# Patient Record
Sex: Male | Born: 1950
Health system: Southern US, Community
[De-identification: ages and names within clinical notes are randomized; demographics above are authoritative.]

## PROBLEM LIST (undated history)

## (undated) DIAGNOSIS — I10 Essential (primary) hypertension: Secondary | ICD-10-CM

## (undated) DIAGNOSIS — IMO0001 Reserved for inherently not codable concepts without codable children: Secondary | ICD-10-CM

## (undated) DIAGNOSIS — K635 Polyp of colon: Secondary | ICD-10-CM

## (undated) DIAGNOSIS — M1990 Unspecified osteoarthritis, unspecified site: Secondary | ICD-10-CM

## (undated) DIAGNOSIS — E1165 Type 2 diabetes mellitus with hyperglycemia: Secondary | ICD-10-CM

## (undated) DIAGNOSIS — K649 Unspecified hemorrhoids: Secondary | ICD-10-CM

## (undated) DIAGNOSIS — N529 Male erectile dysfunction, unspecified: Secondary | ICD-10-CM

## (undated) DIAGNOSIS — E785 Hyperlipidemia, unspecified: Secondary | ICD-10-CM

## (undated) DIAGNOSIS — L570 Actinic keratosis: Secondary | ICD-10-CM

## (undated) DIAGNOSIS — I6529 Occlusion and stenosis of unspecified carotid artery: Secondary | ICD-10-CM

## (undated) DIAGNOSIS — K810 Acute cholecystitis: Secondary | ICD-10-CM

## (undated) HISTORY — DX: Hyperlipidemia, unspecified: E78.5

## (undated) HISTORY — DX: Type 2 diabetes mellitus with hyperglycemia: E11.65

## (undated) HISTORY — DX: Unspecified osteoarthritis, unspecified site: M19.90

## (undated) HISTORY — DX: Essential (primary) hypertension: I10

## (undated) HISTORY — DX: Polyp of colon: K63.5

## (undated) HISTORY — DX: Acute cholecystitis: K81.0

## (undated) HISTORY — DX: Actinic keratosis: L57.0

## (undated) HISTORY — DX: Reserved for inherently not codable concepts without codable children: IMO0001

## (undated) HISTORY — PX: TONSILLECTOMY AND ADENOIDECTOMY: SUR1326

## (undated) HISTORY — DX: Male erectile dysfunction, unspecified: N52.9

## (undated) HISTORY — DX: Occlusion and stenosis of unspecified carotid artery: I65.29

## (undated) HISTORY — PX: COLONOSCOPY: SHX174

---

## 2003-12-07 ENCOUNTER — Encounter: Admission: RE | Admit: 2003-12-07 | Discharge: 2003-12-07 | Payer: Self-pay | Admitting: Internal Medicine

## 2003-12-24 ENCOUNTER — Emergency Department (HOSPITAL_COMMUNITY): Admission: EM | Admit: 2003-12-24 | Discharge: 2003-12-24 | Payer: Self-pay | Admitting: Emergency Medicine

## 2003-12-25 ENCOUNTER — Encounter: Admission: RE | Admit: 2003-12-25 | Discharge: 2003-12-25 | Payer: Self-pay | Admitting: Internal Medicine

## 2004-03-02 ENCOUNTER — Encounter: Admission: RE | Admit: 2004-03-02 | Discharge: 2004-03-02 | Payer: Self-pay | Admitting: Internal Medicine

## 2004-03-16 ENCOUNTER — Encounter: Admission: RE | Admit: 2004-03-16 | Discharge: 2004-03-16 | Payer: Self-pay | Admitting: Internal Medicine

## 2004-10-25 ENCOUNTER — Ambulatory Visit: Payer: Self-pay | Admitting: Cardiology

## 2004-10-25 ENCOUNTER — Inpatient Hospital Stay (HOSPITAL_COMMUNITY): Admission: EM | Admit: 2004-10-25 | Discharge: 2004-10-26 | Payer: Self-pay | Admitting: Nurse Practitioner

## 2004-11-07 ENCOUNTER — Ambulatory Visit: Payer: Self-pay

## 2004-11-07 ENCOUNTER — Ambulatory Visit: Payer: Self-pay | Admitting: Internal Medicine

## 2004-11-12 ENCOUNTER — Ambulatory Visit: Payer: Self-pay

## 2005-10-23 ENCOUNTER — Encounter: Admission: RE | Admit: 2005-10-23 | Discharge: 2005-10-23 | Payer: Self-pay | Admitting: Internal Medicine

## 2007-04-30 HISTORY — PX: CARDIAC CATHETERIZATION: SHX172

## 2011-01-08 ENCOUNTER — Ambulatory Visit (INDEPENDENT_AMBULATORY_CARE_PROVIDER_SITE_OTHER): Payer: BC Managed Care – PPO | Admitting: Family Medicine

## 2011-01-08 ENCOUNTER — Encounter: Payer: Self-pay | Admitting: Family Medicine

## 2011-01-08 VITALS — BP 122/76 | HR 64 | Temp 98.4°F | Ht 70.0 in | Wt 173.0 lb

## 2011-01-08 DIAGNOSIS — K635 Polyp of colon: Secondary | ICD-10-CM

## 2011-01-08 DIAGNOSIS — H547 Unspecified visual loss: Secondary | ICD-10-CM

## 2011-01-08 DIAGNOSIS — I10 Essential (primary) hypertension: Secondary | ICD-10-CM

## 2011-01-08 DIAGNOSIS — K5900 Constipation, unspecified: Secondary | ICD-10-CM

## 2011-01-08 DIAGNOSIS — D126 Benign neoplasm of colon, unspecified: Secondary | ICD-10-CM

## 2011-01-08 DIAGNOSIS — E785 Hyperlipidemia, unspecified: Secondary | ICD-10-CM

## 2011-01-08 DIAGNOSIS — Z0000 Encounter for general adult medical examination without abnormal findings: Secondary | ICD-10-CM

## 2011-01-08 DIAGNOSIS — Z125 Encounter for screening for malignant neoplasm of prostate: Secondary | ICD-10-CM

## 2011-01-08 DIAGNOSIS — E1169 Type 2 diabetes mellitus with other specified complication: Secondary | ICD-10-CM | POA: Insufficient documentation

## 2011-01-08 LAB — GLUCOSE, POCT (MANUAL RESULT ENTRY): POC Glucose: 77

## 2011-01-08 NOTE — Assessment & Plan Note (Signed)
Recommended call GI to set up rpt colonoscopy as past due.

## 2011-01-08 NOTE — Assessment & Plan Note (Signed)
Continue meds. Await records.  When returns fasting check FLP.

## 2011-01-08 NOTE — Assessment & Plan Note (Signed)
Snellen decreased on left today. Recommended vision screen. Anticipate some presbyopia

## 2011-01-08 NOTE — Assessment & Plan Note (Signed)
Good control on antihypertensive. Continue. Await records.

## 2011-01-08 NOTE — Progress Notes (Signed)
Subjective:    Patient ID: Mark Davidson, male    DOB: 1951-01-12, 60 y.o.   MRN: [id number]  HPI CC: new pt, establish  Previously saw Dr. Jarold Motto at [facility].  None recently.  Concerned about blood sugar.  Had DOT physical recently, This was done at [facility].  03/2010.  Told sugar was a bit elevated.  Told didn't need any meds.  Has been noticing polyuria, nocturia x~3, does drink large amt soft drinks (regular), 4-5 cans pepsi/day.  Had sausage biscuit this am.    Does go to [place] in [place] to check blood pressure.  Started on ACEI/HCTZ 04/2010.  Does check at pharmacy regularly, doing well.  No HA, vision changes, CP/tightness, SOB, leg swelling.   Also with allergy sxs and cough, feels mildly congested.  No RN, sneezing, itchy eyes.  Some constipation recently in last 2-3 months, not regular, only every few days.  Has been using OTC stool softeners which didn't help.  Then used some laxative or colon cleanser from pharmacy.  Last 3-4 times has had normal stools.  Preventative: Colonoscopy - done ~2008.  Told 2 polyps, recommend rpt every 2 years.  Had done in [place], unsure where.  Due for repeat. Has prostate checked at DOT physical.   Last tetanus shot 03/2010 Hep B shot series completed at [facility].  Medications and allergies reviewed and updated in chart.  Past histories reviewed and updated if relevant as below. There is no problem list on file for this patient.  Past Medical History  Diagnosis Date  . Arthritis   . Hypertension   . Hyperlipemia   . Colon polyp    Past Surgical History  Procedure Date  . Tonsillectomy and adenoidectomy   . Cardiac catheterization 2009    normal per pt   History  Substance Use Topics  . Smoking status: Never Smoker   . Smokeless tobacco: Current User    Types: Snuff  . Alcohol Use: No   Family History  Problem Relation Age of Onset  . Rheum arthritis Father   . Hyperlipidemia Father   . Heart disease Father   .  Stroke Father   . Diabetes Father   . Arthritis Father   . Coronary artery disease Father   . Breast cancer Mother   . Cancer Mother     breast  . Heart disease Brother   . Coronary artery disease Brother   . Hyperlipidemia Brother   . Diabetes Brother    No Known Allergies No current outpatient prescriptions on file prior to visit.   Review of Systems  Constitutional: Negative for fever, chills, activity change, appetite change, fatigue and unexpected weight change.  HENT: Negative for hearing loss and neck pain.   Eyes: Negative for visual disturbance.  Respiratory: Negative for cough, chest tightness, shortness of breath and wheezing.   Cardiovascular: Negative for chest pain, palpitations and leg swelling.  Gastrointestinal: Negative for nausea, vomiting, abdominal pain, diarrhea, constipation, blood in stool and abdominal distention.  Genitourinary: Negative for hematuria and difficulty urinating.  Musculoskeletal: Negative for myalgias and arthralgias.  Skin: Negative for rash.  Neurological: Negative for dizziness, seizures, syncope and headaches.  Hematological: Does not bruise/bleed easily.  Psychiatric/Behavioral: Negative for dysphoric mood. The patient is not nervous/anxious.        Objective:   Physical Exam  Nursing note and vitals reviewed. Constitutional: He is oriented to person, place, and time. He appears well-developed and well-nourished. No distress.  HENT:  Head: Normocephalic and atraumatic.  Right Ear: Hearing, tympanic membrane, external ear and ear canal normal.  Left Ear: Hearing, tympanic membrane, external ear and ear canal normal.  Nose: Nose normal. No mucosal edema or rhinorrhea.  Mouth/Throat: Uvula is midline, oropharynx is clear and moist and mucous membranes are normal. No oropharyngeal exudate, posterior oropharyngeal edema, posterior oropharyngeal erythema or tonsillar abscesses.  Eyes: Conjunctivae and EOM are normal. Pupils are equal,  round, and reactive to light. No scleral icterus.  Neck: Normal range of motion. Neck supple. No thyromegaly present.  Cardiovascular: Normal rate, regular rhythm, normal heart sounds and intact distal pulses.   No murmur heard. Pulses:      Radial pulses are 2+ on the right side, and 2+ on the left side.  Pulmonary/Chest: Effort normal and breath sounds normal. No respiratory distress. He has no wheezes. He has no rales.  Abdominal: Soft. Bowel sounds are normal. He exhibits no distension and no mass. There is no tenderness. There is no rebound and no guarding.  Musculoskeletal: Normal range of motion. He exhibits no edema.  Lymphadenopathy:    He has no cervical adenopathy.  Neurological: He is alert and oriented to person, place, and time.       CN grossly intact, station and gait intact  Skin: Skin is warm and dry. No rash noted.  Psychiatric: He has a normal mood and affect. His behavior is normal. Judgment and thought content normal.      Assessment & Plan:

## 2011-01-08 NOTE — Patient Instructions (Addendum)
Goal is one soft stool a day.  Start with more fiber in diet (handout provided). May use benefiber or metamucil (but make sure to increase water intake at same time).  If more problems with constipation, give me a call.  We will request records from Dr. Jarold Motto at [facility].  I'd recommend checking for colonoscopy at home and calling to reschedule as you're due. We checked sugar today, looking ok. Come back in 3-4 months for DOT physical, return prior fasting for blood work. Good to meet you today, call us with questions.

## 2011-01-08 NOTE — Assessment & Plan Note (Signed)
Discussed importance of fiber and water.  rec colace, fiber in diet, fiber supplements.  If not improved to let me know, would likely recommend miralax.

## 2011-04-02 ENCOUNTER — Other Ambulatory Visit: Payer: Self-pay | Admitting: Internal Medicine

## 2011-04-02 ENCOUNTER — Other Ambulatory Visit: Payer: Self-pay | Admitting: Family Medicine

## 2011-04-02 MED ORDER — LISINOPRIL-HYDROCHLOROTHIAZIDE 10-12.5 MG PO TABS: 1.0000 | ORAL_TABLET | Freq: Every day | ORAL | Status: DC

## 2011-04-02 MED ORDER — OMEPRAZOLE MAGNESIUM 20 MG PO TBEC: 20.0000 mg | DELAYED_RELEASE_TABLET | Freq: Two times a day (BID) | ORAL | Status: DC

## 2011-04-02 MED ORDER — PRAVASTATIN SODIUM 80 MG PO TABS: 80.0000 mg | ORAL_TABLET | Freq: Every day | ORAL | Status: DC

## 2011-04-02 NOTE — Telephone Encounter (Signed)
Patient's wife called and stated that you stated when he ran out of his medications at the [place] to call and you would reorder all of his medications to [facility] pharmacy.  Please advise.

## 2011-04-02 NOTE — Telephone Encounter (Signed)
Sent in 3 mo supply.

## 2011-04-03 ENCOUNTER — Other Ambulatory Visit: Payer: BC Managed Care – PPO

## 2011-04-03 NOTE — Telephone Encounter (Signed)
Message left notifying patient. Instructed him to call with questions or problems.

## 2011-04-09 ENCOUNTER — Encounter: Payer: BC Managed Care – PPO | Admitting: Family Medicine

## 2011-06-28 ENCOUNTER — Ambulatory Visit (INDEPENDENT_AMBULATORY_CARE_PROVIDER_SITE_OTHER): Payer: BC Managed Care – PPO | Admitting: Family Medicine

## 2011-06-28 ENCOUNTER — Encounter: Payer: Self-pay | Admitting: Family Medicine

## 2011-06-28 VITALS — BP 130/80 | HR 68 | Temp 98.1°F | Wt 185.2 lb

## 2011-06-28 DIAGNOSIS — N529 Male erectile dysfunction, unspecified: Secondary | ICD-10-CM

## 2011-06-28 DIAGNOSIS — I10 Essential (primary) hypertension: Secondary | ICD-10-CM

## 2011-06-28 DIAGNOSIS — R21 Rash and other nonspecific skin eruption: Secondary | ICD-10-CM

## 2011-06-28 DIAGNOSIS — L853 Xerosis cutis: Secondary | ICD-10-CM | POA: Insufficient documentation

## 2011-06-28 MED ORDER — SILDENAFIL CITRATE 100 MG PO TABS: 100.0000 mg | ORAL_TABLET | Freq: Every day | ORAL | Status: DC | PRN

## 2011-06-28 NOTE — Patient Instructions (Signed)
For itch - skin looks fine.  Use sarna cream and oatmeal baths.  If not better with this then let me know. Blood pressure is looking ok today. Continue medicine. I will send in viagra to pharmacy - use coupon provided.  Take 1/2 pill of 100mg  as needed.  Return at your convenience fasting for blood work and afterwards for physical - we will go over blood work results then.

## 2011-06-28 NOTE — Progress Notes (Signed)
  Subjective:    Patient ID: Mark Davidson, male    DOB: [birth year], 61 y.o.   MRN: [id number]  HPI CC: skin rash and nocturia  1 mo h/o skin irritation on legs and on chest - itching and burning.  Worse at night when trying to sleep.  No new lotions, detergents, soaps, shampoos.  Uses ivory soap.  No new meds.  Hit left anterior shin 2-3 months ago, staying red.    Nocturia 3-4 x/night.  Strong stream.  No dysuria.  Urge present.  Polyuria present.  Drinks 4-5 pepsi's day.  Last drink is water at bedtime with pravastatin.  Never told enlarged prostate, told normal.  Has been told has borderline sugars.  Last visit random cbg normal at 77.  ED - Has tried viagra and cialis but too expensive.  Wonders about alternatives.  Had DOT physical last month, didn't have prostate checked. Last colonoscopy 2008, told rpt 2 yrs as found polyps, did not return.  Due for this.  Review of Systems Per HPI    Objective:   Physical Exam  Nursing note and vitals reviewed. Constitutional: He appears well-developed and well-nourished. No distress.  HENT:  Head: Normocephalic and atraumatic.  Mouth/Throat: Oropharynx is clear and moist. No oropharyngeal exudate.  Eyes: Conjunctivae and EOM are normal. Pupils are equal, round, and reactive to light. No scleral icterus.  Neck: Normal range of motion. Neck supple. Carotid bruit is not present.  Cardiovascular: Normal rate, regular rhythm, normal heart sounds and intact distal pulses.   No murmur heard. Pulmonary/Chest: Effort normal and breath sounds normal. No respiratory distress. He has no wheezes. He has no rales.  Musculoskeletal: He exhibits no edema.  Lymphadenopathy:    He has no cervical adenopathy.  Skin: Skin is warm and dry. No rash noted.       No rash on legs or on back or abdomen, chest. Slight hypervascular macule left anterior lower leg.  Psychiatric: He has a normal mood and affect.       Assessment & Plan:

## 2011-06-28 NOTE — Assessment & Plan Note (Signed)
Chronic.  Has used viagra and cialis in past, work well but concerned about cost. Provided with viagra script and coupons. Discussed use.

## 2011-06-28 NOTE — Assessment & Plan Note (Signed)
Chronic. Stable, continue

## 2011-06-28 NOTE — Assessment & Plan Note (Signed)
Anticipate mild xerosis.  No rash noted. rec sarna and oatmeal baths. Discussed avoid hot baths.

## 2011-08-08 ENCOUNTER — Other Ambulatory Visit (INDEPENDENT_AMBULATORY_CARE_PROVIDER_SITE_OTHER): Payer: BC Managed Care – PPO

## 2011-08-08 DIAGNOSIS — Z125 Encounter for screening for malignant neoplasm of prostate: Secondary | ICD-10-CM

## 2011-08-08 DIAGNOSIS — Z0000 Encounter for general adult medical examination without abnormal findings: Secondary | ICD-10-CM

## 2011-08-08 LAB — COMPREHENSIVE METABOLIC PANEL
ALT: 22 U/L (ref 0–53)
AST: 18 U/L (ref 0–37)
Albumin: 4.5 g/dL (ref 3.5–5.2)
Alkaline Phosphatase: 82 U/L (ref 39–117)
BUN: 18 mg/dL (ref 6–23)
CO2: 27 mEq/L (ref 19–32)
Calcium: 9.6 mg/dL (ref 8.4–10.5)
Chloride: 101 mEq/L (ref 96–112)
Creatinine, Ser: 1.1 mg/dL (ref 0.4–1.5)
GFR: 76.11 mL/min (ref >60.00)
Glucose, Bld: 97 mg/dL (ref 70–99)
Potassium: 4.1 mEq/L (ref 3.5–5.1)
Sodium: 137 mEq/L (ref 135–145)
Total Bilirubin: 0.8 mg/dL (ref 0.3–1.2)
Total Protein: 7.3 g/dL (ref 6.0–8.3)

## 2011-08-08 LAB — LIPID PANEL
Cholesterol: 211 mg/dL — ABNORMAL HIGH (ref 0–200)
HDL: 41.9 mg/dL (ref >39.00)
Total CHOL/HDL Ratio: 5
Triglycerides: 144 mg/dL (ref 0.0–149.0)
VLDL: 28.8 mg/dL (ref 0.0–40.0)

## 2011-08-08 LAB — LDL CHOLESTEROL, DIRECT: Direct LDL: 147.8 mg/dL

## 2011-08-08 LAB — PSA: PSA: 0.55 ng/mL (ref 0.10–4.00)

## 2011-08-08 LAB — TSH: TSH: 0.98 u[IU]/mL (ref 0.35–5.50)

## 2011-08-15 ENCOUNTER — Encounter: Payer: BC Managed Care – PPO | Admitting: Family Medicine

## 2011-08-19 ENCOUNTER — Telehealth: Payer: Self-pay

## 2011-08-19 NOTE — Telephone Encounter (Signed)
Pt seen 06/28/11; Sarna cream gives temporary relief from redness and itching but relief does not last. Pt cancelled CPX and would like lab results.Pt uses [facility] and can be reached at [phone].

## 2011-08-20 NOTE — Telephone Encounter (Signed)
Pt called back and would like Dr Gutierrez's response called back to [phone].

## 2011-08-20 NOTE — Telephone Encounter (Signed)
Using calogel lotion which relieves itching but staying red. Exposed to powders at work wonders if allergic rxn. Advised stop peroxide and alcohol as has been using this. rec come in for OV to eval. Pt will call to make appt next week.

## 2011-08-27 ENCOUNTER — Ambulatory Visit (INDEPENDENT_AMBULATORY_CARE_PROVIDER_SITE_OTHER): Payer: BC Managed Care – PPO | Admitting: Family Medicine

## 2011-08-27 ENCOUNTER — Encounter: Payer: Self-pay | Admitting: Family Medicine

## 2011-08-27 VITALS — BP 140/82 | HR 64 | Temp 97.2°F | Wt 182.5 lb

## 2011-08-27 DIAGNOSIS — R21 Rash and other nonspecific skin eruption: Secondary | ICD-10-CM

## 2011-08-27 DIAGNOSIS — R05 Cough: Secondary | ICD-10-CM | POA: Insufficient documentation

## 2011-08-27 DIAGNOSIS — H6120 Impacted cerumen, unspecified ear: Secondary | ICD-10-CM

## 2011-08-27 DIAGNOSIS — R059 Cough, unspecified: Secondary | ICD-10-CM | POA: Insufficient documentation

## 2011-08-27 DIAGNOSIS — R058 Other specified cough: Secondary | ICD-10-CM

## 2011-08-27 MED ORDER — TRIAMCINOLONE ACETONIDE 0.1 % EX CREA: TOPICAL_CREAM | Freq: Two times a day (BID) | CUTANEOUS | Status: DC

## 2011-08-27 MED ORDER — LOSARTAN POTASSIUM-HCTZ 50-12.5 MG PO TABS: 1.0000 | ORAL_TABLET | Freq: Every day | ORAL | Status: DC

## 2011-08-27 NOTE — Assessment & Plan Note (Signed)
Anticipate continued xerosis/dry skin dermatitis. Treat with triamcinolone cream bid, update if not improving.

## 2011-08-27 NOTE — Patient Instructions (Signed)
Try steroid cream to rash on legs - should help with itching.  For blood pressure stop lisinopril HCTZ combo and start hyzaar combo.  Sent to pharmacy - price this out.  Keep eye on cough.

## 2011-08-27 NOTE — Progress Notes (Signed)
  Subjective:    Patient ID: Mark Davidson, male    DOB: 12-06-1950, 61 y.o.   MRN: [id number]  HPI CC: skin rash  Seen here 06/28/2011 with 1 mo h/o skin irritation on legs and on chest - itching and burning. Worse at night when trying to sleep. No new lotions, detergents, soaps, shampoos. Uses ivory soap. No new meds. Hit left anterior shin 2-3 months ago, staying red.   When examined, no rash noted, thought mild xerosis, treated with oatmeal bath and sarna cream.  States this improved sxs but only temporarily and then rash would present.  Also discussed avoidance of hot showers.  Since then, using sarna, oatmeal baths, calogel lotion which relieves itching but then returns and rash remaining. Exposed to different powders and chemicals at work (works at [employer]) - wonders if allergic rxn to something exposed to at work. Had been using peroxide and alcohol for itch - but stopped this 1 wk ago. Worse when getting out of shower - red and itchy rash. Seems localized to calves (L>R) and anterior chest.  Some on arms.  Rash present for last several months.  No fevers/chills, abd pain, nausea, oral lesions, joint pains.  Dry cough present intermittently for last several months.  Dips, doesn't smoke.  On ACEI for last year.  Also wants ears checked - some tinnitus on left side.  Review of Systems Per HPI    Objective:   Physical Exam  Nursing note and vitals reviewed. Constitutional: He appears well-developed and well-nourished. No distress.  HENT:  Right Ear: External ear normal.  Left Ear: External ear normal.       Cerumen impaction bilaterally - disimpaction performed with plastic curettes with improvement in tinnitus.but still unable to visualize TMs - remaining cerumen.  Cardiovascular: Normal rate, regular rhythm, normal heart sounds and intact distal pulses.   No murmur heard. Pulmonary/Chest: Effort normal and breath sounds normal. No respiratory distress. He has no  wheezes. He has no rales.  Skin: Rash noted.       Left medial/posterior calf with large patch of rough, dry pruritic skin. Smaller patch R medial calf. Anterior forearms with mild erythema. Slight erythema on chest        Assessment & Plan:

## 2011-08-27 NOTE — Assessment & Plan Note (Signed)
Disimpaction performed, advised to start debrox or dilute H2O2 every 2 wks rtc as needed, consider irrigation next visit.Marland Kitchen

## 2011-08-27 NOTE — Assessment & Plan Note (Signed)
?  ACEI induced. Change to ARB, reassess next visit. rtc 2 mo to f/u BP.

## 2012-01-17 ENCOUNTER — Ambulatory Visit: Payer: BC Managed Care – PPO

## 2012-03-23 ENCOUNTER — Ambulatory Visit (INDEPENDENT_AMBULATORY_CARE_PROVIDER_SITE_OTHER): Payer: BC Managed Care – PPO | Admitting: Family Medicine

## 2012-03-23 ENCOUNTER — Encounter: Payer: Self-pay | Admitting: Family Medicine

## 2012-03-23 VITALS — BP 122/62 | HR 68 | Temp 98.0°F | Wt 182.8 lb

## 2012-03-23 DIAGNOSIS — R21 Rash and other nonspecific skin eruption: Secondary | ICD-10-CM

## 2012-03-23 DIAGNOSIS — I6523 Occlusion and stenosis of bilateral carotid arteries: Secondary | ICD-10-CM | POA: Insufficient documentation

## 2012-03-23 DIAGNOSIS — R0989 Other specified symptoms and signs involving the circulatory and respiratory systems: Secondary | ICD-10-CM

## 2012-03-23 DIAGNOSIS — I10 Essential (primary) hypertension: Secondary | ICD-10-CM

## 2012-03-23 DIAGNOSIS — Z23 Encounter for immunization: Secondary | ICD-10-CM

## 2012-03-23 MED ORDER — TRIAMCINOLONE ACETONIDE 0.1 % EX CREA: TOPICAL_CREAM | Freq: Two times a day (BID) | CUTANEOUS | Status: AC

## 2012-03-23 NOTE — Progress Notes (Signed)
  Subjective:    Patient ID: Mark Davidson, male    DOB: Sep 30, 1950, 61 y.o.   MRN: [id number]  HPI CC: skin itching returning and f/u blood pressure  HTN - No HA, CP/tightness, SOB, leg swelling.  Some blurry vision noted when indoors. Sees outside fine.  Last saw eye doctor 1-2 yrs ago.  Compliant with meds.    Has been told "borderline" sugar.  Skin itch - last time responded well to TCI cream.  Requests refill.  H/o xerosis.  Using sensitive soap.  Past Medical History  Diagnosis Date  . Arthritis   . Hypertension   . Hyperlipemia   . Colon polyp   . ED (erectile dysfunction)      Review of Systems Per HPI    Objective:   Physical Exam  Nursing note and vitals reviewed. Constitutional: He appears well-developed and well-nourished. No distress.  HENT:  Head: Normocephalic and atraumatic.  Mouth/Throat: Oropharynx is clear and moist. No oropharyngeal exudate.  Eyes: Conjunctivae normal and EOM are normal. Pupils are equal, round, and reactive to light. No scleral icterus.  Neck: Normal range of motion. Neck supple. Carotid bruit is present (L mild).  Cardiovascular: Normal rate, regular rhythm, normal heart sounds and intact distal pulses.   No murmur heard. Pulmonary/Chest: Effort normal and breath sounds normal. No respiratory distress. He has no wheezes. He has no rales.  Musculoskeletal: He exhibits no edema.  Lymphadenopathy:    He has no cervical adenopathy.  Skin: Skin is warm and dry. No rash noted.       No rash on legs appreciated       Assessment & Plan:

## 2012-03-23 NOTE — Assessment & Plan Note (Signed)
Discussed management of xerosis.   rec avoid hot showers as well as regular moisturizing. Prescribe triamcinolone cream to use as needed, discussed steroid holiday.

## 2012-03-23 NOTE — Assessment & Plan Note (Signed)
Chronic, stable. Continue med. 

## 2012-03-23 NOTE — Patient Instructions (Addendum)
Consider decreasing prilosec to 20mg  one daily. I've refilled triamcinolone. Good to see you today, call us with questions. You're due for colonoscopy!  Please schedule appointment given your history of polyps. Flu shot today. For legs - use moisturizer (Aveeno or cetaphil or vaseline brand) after shower daily.  May use triamcinolone cream as needed, but no more than 2 wks at a time. Pass by Marion's office for referral for carotid ultrasound.

## 2012-03-23 NOTE — Assessment & Plan Note (Signed)
New.  Will obtain carotid US to further eval. Mother with h/o same.

## 2012-03-29 HISTORY — PX: OTHER SURGICAL HISTORY: SHX169

## 2012-04-02 ENCOUNTER — Encounter (INDEPENDENT_AMBULATORY_CARE_PROVIDER_SITE_OTHER): Payer: BC Managed Care – PPO

## 2012-04-02 DIAGNOSIS — I6529 Occlusion and stenosis of unspecified carotid artery: Secondary | ICD-10-CM

## 2012-04-02 DIAGNOSIS — R0989 Other specified symptoms and signs involving the circulatory and respiratory systems: Secondary | ICD-10-CM

## 2012-04-07 ENCOUNTER — Encounter: Payer: Self-pay | Admitting: *Deleted

## 2012-04-07 ENCOUNTER — Encounter: Payer: Self-pay | Admitting: Family Medicine

## 2012-04-20 ENCOUNTER — Other Ambulatory Visit: Payer: Self-pay | Admitting: *Deleted

## 2012-04-20 MED ORDER — PRAVASTATIN SODIUM 80 MG PO TABS: 80.0000 mg | ORAL_TABLET | Freq: Every day | ORAL | Status: DC

## 2012-05-12 ENCOUNTER — Other Ambulatory Visit: Payer: Self-pay | Admitting: *Deleted

## 2012-05-12 MED ORDER — OMEPRAZOLE MAGNESIUM 20 MG PO TBEC: 20.0000 mg | DELAYED_RELEASE_TABLET | Freq: Two times a day (BID) | ORAL | Status: DC

## 2012-09-18 ENCOUNTER — Other Ambulatory Visit: Payer: Self-pay | Admitting: *Deleted

## 2012-09-18 MED ORDER — LOSARTAN POTASSIUM-HCTZ 50-12.5 MG PO TABS: 1.0000 | ORAL_TABLET | Freq: Every day | ORAL | Status: DC

## 2012-10-19 ENCOUNTER — Other Ambulatory Visit: Payer: Self-pay | Admitting: *Deleted

## 2012-10-19 MED ORDER — PRAVASTATIN SODIUM 80 MG PO TABS: 80.0000 mg | ORAL_TABLET | Freq: Every day | ORAL | Status: DC

## 2012-10-19 NOTE — Telephone Encounter (Signed)
Fax refill request, no recent/future appt, forward to PCP

## 2013-04-09 ENCOUNTER — Ambulatory Visit: Payer: BC Managed Care – PPO

## 2013-04-15 ENCOUNTER — Telehealth: Payer: Self-pay | Admitting: *Deleted

## 2013-04-15 NOTE — Telephone Encounter (Signed)
Patient doen't want to schedule Carotid at this time due to patient having an account balance. I gave the patient the billing number to [facility] and said that we could work with him. He said that he would reschedule at a later date.

## 2013-04-16 ENCOUNTER — Other Ambulatory Visit: Payer: Self-pay | Admitting: Family Medicine

## 2013-04-19 ENCOUNTER — Ambulatory Visit (INDEPENDENT_AMBULATORY_CARE_PROVIDER_SITE_OTHER): Payer: BC Managed Care – PPO

## 2013-04-19 DIAGNOSIS — Z23 Encounter for immunization: Secondary | ICD-10-CM

## 2013-04-20 ENCOUNTER — Ambulatory Visit: Payer: BC Managed Care – PPO

## 2013-04-20 ENCOUNTER — Other Ambulatory Visit: Payer: Self-pay | Admitting: Family Medicine

## 2013-04-20 NOTE — Telephone Encounter (Signed)
Pt request medication refill. Last OV 02/2012 with no future appts scheduled. pls advise.

## 2013-04-21 ENCOUNTER — Other Ambulatory Visit: Payer: Self-pay | Admitting: Family Medicine

## 2013-04-21 NOTE — Telephone Encounter (Signed)
guttierez pt

## 2013-04-21 NOTE — Addendum Note (Signed)
Addended by: Josph Macho A on: 04/21/2013 08:44 AM   Modules accepted: Orders

## 2013-05-18 ENCOUNTER — Ambulatory Visit: Payer: BC Managed Care – PPO | Admitting: Family Medicine

## 2013-05-19 ENCOUNTER — Other Ambulatory Visit: Payer: Self-pay | Admitting: Family Medicine

## 2013-05-19 ENCOUNTER — Encounter: Payer: Self-pay | Admitting: Family Medicine

## 2013-05-19 ENCOUNTER — Ambulatory Visit (INDEPENDENT_AMBULATORY_CARE_PROVIDER_SITE_OTHER): Payer: BC Managed Care – PPO | Admitting: Family Medicine

## 2013-05-19 VITALS — BP 148/84 | HR 62 | Temp 98.1°F | Wt 184.5 lb

## 2013-05-19 DIAGNOSIS — H6120 Impacted cerumen, unspecified ear: Secondary | ICD-10-CM

## 2013-05-19 DIAGNOSIS — J019 Acute sinusitis, unspecified: Secondary | ICD-10-CM

## 2013-05-19 MED ORDER — LOSARTAN POTASSIUM-HCTZ 50-12.5 MG PO TABS: 1.0000 | ORAL_TABLET | Freq: Every day | ORAL | Status: DC

## 2013-05-19 MED ORDER — AMOXICILLIN-POT CLAVULANATE 875-125 MG PO TABS: 1.0000 | ORAL_TABLET | Freq: Two times a day (BID) | ORAL | Status: AC

## 2013-05-19 NOTE — Assessment & Plan Note (Signed)
Anticipate acute sinusitis, but no indication of acute bacterial infection. Supportive care as per instructions. May fill WASP for augmentin if sxs persist past 10 days or any worsening  Pt agrees  Will return for CPE

## 2013-05-19 NOTE — Assessment & Plan Note (Signed)
Irrigation performed today. 

## 2013-05-19 NOTE — Progress Notes (Signed)
Pre-visit discussion using our clinic review tool. No additional management support is needed unless otherwise documented below in the visit note.  

## 2013-05-19 NOTE — Patient Instructions (Addendum)
Ears irrigated today. You have a sinus infection. Push fluids and plenty of rest. Use tylenol for discomfort in sinuses. Nasal saline irrigation or neti pot to help drain sinuses. May use plain mucinex or immediate release guaifenesin with plenty of fluid to help mobilize mucous. If persistent symptoms into the weekend, fill antibiotic provided today. Let us know if fever >101.5, trouble opening/closing mouth, difficulty swallowing, or worsening - you may need to be seen again.

## 2013-05-19 NOTE — Progress Notes (Signed)
   Subjective:    Patient ID: Mark Davidson, male    DOB: Nov 19, 1950, 63 y.o.   MRN: [id number]  HPI CC: ear and head congestion  6d h/o head congestion and ears stopped up.  Nasal congestion with sinus congestion. +PNdrainage. No fevers, abd pain, cough, ST.  No body aches. So far has tried OTC pseudophed which hasn't helped.  + contacts with flu at work. No smoking at home No h/o asthma.  Past Medical History  Diagnosis Date  . Arthritis   . Hypertension   . Hyperlipemia   . Colon polyp   . ED (erectile dysfunction)   . Left carotid bruit     Review of Systems Per HPI    Objective:   Physical Exam  Nursing note and vitals reviewed. Constitutional: He appears well-developed and well-nourished. No distress.  HENT:  Head: Normocephalic and atraumatic.  Right Ear: Hearing, external ear and ear canal normal.  Left Ear: Hearing, external ear and ear canal normal.  Nose: Mucosal edema present. No rhinorrhea. Right sinus exhibits no maxillary sinus tenderness and no frontal sinus tenderness. Left sinus exhibits no maxillary sinus tenderness and no frontal sinus tenderness.  Mouth/Throat: Uvula is midline and mucous membranes are normal. Posterior oropharyngeal erythema present. No oropharyngeal exudate, posterior oropharyngeal edema or tonsillar abscesses.  Cerumen covering TM bilaterally Nasal mucosal congestion  Eyes: Conjunctivae and EOM are normal. Pupils are equal, round, and reactive to light. No scleral icterus.  Neck: Normal range of motion. Neck supple.  Cardiovascular: Normal rate, regular rhythm, normal heart sounds and intact distal pulses.   No murmur heard. Pulmonary/Chest: Effort normal and breath sounds normal. No respiratory distress. He has no wheezes. He has no rales.  Lymphadenopathy:    He has no cervical adenopathy.  Skin: Skin is warm and dry. No rash noted.       Assessment & Plan:

## 2013-06-01 ENCOUNTER — Telehealth: Payer: Self-pay | Admitting: Family Medicine

## 2013-06-01 NOTE — Telephone Encounter (Signed)
Relevant patient education mailed to patient.  

## 2013-06-28 ENCOUNTER — Other Ambulatory Visit: Payer: Self-pay | Admitting: Family Medicine

## 2013-06-28 DIAGNOSIS — I10 Essential (primary) hypertension: Secondary | ICD-10-CM

## 2013-06-29 ENCOUNTER — Other Ambulatory Visit: Payer: BC Managed Care – PPO

## 2013-07-05 ENCOUNTER — Encounter: Payer: BC Managed Care – PPO | Admitting: Family Medicine

## 2013-07-12 ENCOUNTER — Other Ambulatory Visit: Payer: Self-pay | Admitting: Family Medicine

## 2013-08-12 ENCOUNTER — Other Ambulatory Visit: Payer: Self-pay | Admitting: Family Medicine

## 2013-08-13 ENCOUNTER — Encounter: Payer: Self-pay | Admitting: Family Medicine

## 2013-08-13 ENCOUNTER — Ambulatory Visit (INDEPENDENT_AMBULATORY_CARE_PROVIDER_SITE_OTHER): Payer: BC Managed Care – PPO | Admitting: Family Medicine

## 2013-08-13 VITALS — BP 130/62 | HR 66 | Temp 98.0°F | Wt 189.2 lb

## 2013-08-13 DIAGNOSIS — I10 Essential (primary) hypertension: Secondary | ICD-10-CM

## 2013-08-13 DIAGNOSIS — E785 Hyperlipidemia, unspecified: Secondary | ICD-10-CM

## 2013-08-13 MED ORDER — LOSARTAN POTASSIUM-HCTZ 50-12.5 MG PO TABS: 1.0000 | ORAL_TABLET | Freq: Every day | ORAL | Status: DC

## 2013-08-13 MED ORDER — PRAVASTATIN SODIUM 80 MG PO TABS: ORAL_TABLET | ORAL | Status: DC

## 2013-08-13 NOTE — Patient Instructions (Signed)
Schedule a fasting lab visit.  Don't change your meds.  Gradually cut back on pepsi drinks.   Take care.  Glad to see you.

## 2013-08-13 NOTE — Progress Notes (Signed)
Pre visit review using our clinic review tool, if applicable. No additional management support is needed unless otherwise documented below in the visit note.  Hypertension:    Using medication without problems or lightheadedness: yes Chest pain with exertion:no Edema:no Short of breath:no  Elevated Cholesterol: Using medications without problems:yes Muscle aches: no Diet compliance:discussed cutting out pepsi- drinking about 5 a day! Exercise:yes, walking and weights.    He'll come back for fasting lab.  D/w pt.    Meds, vitals, and allergies reviewed.   PMH and SH reviewed  ROS: See HPI.  Otherwise negative.    GEN: nad, alert and oriented HEENT: mucous membranes moist NECK: supple w/o LA CV: rrr. PULM: ctab, no inc wob ABD: soft, +bs EXT: no edema SKIN: no acute rash

## 2013-08-14 ENCOUNTER — Telehealth: Payer: Self-pay | Admitting: Family Medicine

## 2013-08-14 NOTE — Telephone Encounter (Signed)
Relevant patient education mailed to patient.  

## 2013-08-15 NOTE — Assessment & Plan Note (Signed)
No change in meds.  Will need to work on diet, taper sodas.  He'll come back for fasting lab.  He agrees. Routed to PCP as FYI.  

## 2013-08-15 NOTE — Assessment & Plan Note (Signed)
No change in meds.  Will need to work on diet, taper sodas.  He'll come back for fasting lab.  He agrees. Routed to PCP as FYI.

## 2013-08-18 ENCOUNTER — Other Ambulatory Visit (INDEPENDENT_AMBULATORY_CARE_PROVIDER_SITE_OTHER): Payer: BC Managed Care – PPO

## 2013-08-18 DIAGNOSIS — I10 Essential (primary) hypertension: Secondary | ICD-10-CM

## 2013-08-18 DIAGNOSIS — E785 Hyperlipidemia, unspecified: Secondary | ICD-10-CM

## 2013-08-18 LAB — COMPREHENSIVE METABOLIC PANEL
ALT: 39 U/L (ref 0–53)
AST: 23 U/L (ref 0–37)
Albumin: 3.9 g/dL (ref 3.5–5.2)
Alkaline Phosphatase: 71 U/L (ref 39–117)
BILIRUBIN TOTAL: 0.7 mg/dL (ref 0.3–1.2)
BUN: 16 mg/dL (ref 6–23)
CO2: 31 mEq/L (ref 19–32)
CREATININE: 1 mg/dL (ref 0.4–1.5)
Calcium: 9.7 mg/dL (ref 8.4–10.5)
Chloride: 102 mEq/L (ref 96–112)
GFR: 85.77 mL/min (ref >60.00)
GLUCOSE: 101 mg/dL — AB (ref 70–99)
Potassium: 4.3 mEq/L (ref 3.5–5.1)
Sodium: 139 mEq/L (ref 135–145)
Total Protein: 7.4 g/dL (ref 6.0–8.3)

## 2013-08-18 LAB — LIPID PANEL
CHOLESTEROL: 192 mg/dL (ref 0–200)
HDL: 44.1 mg/dL (ref >39.00)
LDL CALC: 125 mg/dL — AB (ref 0–99)
TRIGLYCERIDES: 116 mg/dL (ref 0.0–149.0)
Total CHOL/HDL Ratio: 4
VLDL: 23.2 mg/dL (ref 0.0–40.0)

## 2013-08-19 ENCOUNTER — Encounter: Payer: Self-pay | Admitting: *Deleted

## 2013-12-02 ENCOUNTER — Other Ambulatory Visit: Payer: Self-pay | Admitting: *Deleted

## 2013-12-02 MED ORDER — OMEPRAZOLE 20 MG PO CPDR: DELAYED_RELEASE_CAPSULE | ORAL | Status: DC

## 2013-12-02 MED ORDER — LOSARTAN POTASSIUM-HCTZ 50-12.5 MG PO TABS: 1.0000 | ORAL_TABLET | Freq: Every day | ORAL | Status: DC

## 2013-12-02 MED ORDER — PRAVASTATIN SODIUM 80 MG PO TABS: ORAL_TABLET | ORAL | Status: DC

## 2013-12-02 NOTE — Telephone Encounter (Signed)
This patient's wife has just realized that her insurance is requiring that she and her husband use [facility] or [facility].  [facility] [address] [place] and he needs 90 day supplies.  Is this okay?

## 2014-05-06 ENCOUNTER — Other Ambulatory Visit: Payer: Self-pay | Admitting: Family Medicine

## 2014-05-19 ENCOUNTER — Ambulatory Visit (INDEPENDENT_AMBULATORY_CARE_PROVIDER_SITE_OTHER): Payer: BLUE CROSS/BLUE SHIELD | Admitting: Family Medicine

## 2014-05-19 ENCOUNTER — Encounter: Payer: Self-pay | Admitting: Family Medicine

## 2014-05-19 ENCOUNTER — Other Ambulatory Visit: Payer: Self-pay | Admitting: Family Medicine

## 2014-05-19 VITALS — BP 138/80 | HR 64 | Temp 97.9°F | Wt 193.2 lb

## 2014-05-19 DIAGNOSIS — I10 Essential (primary) hypertension: Secondary | ICD-10-CM

## 2014-05-19 DIAGNOSIS — E785 Hyperlipidemia, unspecified: Secondary | ICD-10-CM

## 2014-05-19 DIAGNOSIS — R0989 Other specified symptoms and signs involving the circulatory and respiratory systems: Secondary | ICD-10-CM

## 2014-05-19 DIAGNOSIS — Z23 Encounter for immunization: Secondary | ICD-10-CM

## 2014-05-19 DIAGNOSIS — K635 Polyp of colon: Secondary | ICD-10-CM

## 2014-05-19 MED ORDER — PRAVASTATIN SODIUM 80 MG PO TABS: ORAL_TABLET | ORAL | Status: DC

## 2014-05-19 MED ORDER — OMEPRAZOLE 20 MG PO CPDR: DELAYED_RELEASE_CAPSULE | ORAL | Status: DC

## 2014-05-19 MED ORDER — LOSARTAN POTASSIUM-HCTZ 50-12.5 MG PO TABS: ORAL_TABLET | ORAL | Status: DC

## 2014-05-19 NOTE — Assessment & Plan Note (Signed)
Chronic, stable on recent FLP. Continue pravastatin. Check FLP next fasting labwork.

## 2014-05-19 NOTE — Progress Notes (Signed)
Pre visit review using our clinic review tool, if applicable. No additional management support is needed unless otherwise documented below in the visit note. 

## 2014-05-19 NOTE — Assessment & Plan Note (Addendum)
Discussed. Pt due for f/u but still has $ due from last [place]. Advised to call cards to discuss payment plan.

## 2014-05-19 NOTE — Progress Notes (Signed)
BP 138/80 mmHg  Pulse 64  Temp(Src) 97.9 F (36.6 C) (Oral)  Wt 193 lb 4 oz (87.658 kg)   CC: f/u visit  Subjective:    Patient ID: Mark Davidson, male    DOB: 10-15-1950, 64 y.o.   MRN: [id number]  HPI: Mark Davidson is a 64 y.o. male presenting on 05/19/2014 for Follow-up   BP elevated at [employer] CPE - bp up to 676 systolic, down to 195 but still elevated.  HTN - Compliant with current antihypertensive regimen of hyzaar 50/12.5mg  daily.  Does check blood pressures at home: irregularly but good control - 130 always.  No low blood pressure readings or symptoms of dizziness/syncope.  Denies HA, vision changes, CP/tightness, SOB, leg swelling.   BP Readings from Last 3 Encounters:  05/19/14 138/80  08/13/13 130/62  05/19/13 148/84    HLD - compliant with pravastatin. Denies myalgias.   H/o carotid stenosis - had US done 03/2012 with 40-59% stenosis. Was unable to finish paying previously, still owes $, was sent to collections.   Colonoscopy - + polyps, unsure where this was done. rec rpt 3-5 yrs.   Relevant past medical, surgical, family and social history reviewed and updated as indicated. Interim medical history since our last visit reviewed. Allergies and medications reviewed and updated. Current Outpatient Prescriptions on File Prior to Visit  Medication Sig  . diphenhydramine-acetaminophen (TYLENOL PM) 25-500 MG TABS Take 1 tablet by mouth at bedtime as needed.   No current facility-administered medications on file prior to visit.    Review of Systems Per HPI unless specifically indicated above     Objective:    BP 138/80 mmHg  Pulse 64  Temp(Src) 97.9 F (36.6 C) (Oral)  Wt 193 lb 4 oz (87.658 kg)  Wt Readings from Last 3 Encounters:  05/19/14 193 lb 4 oz (87.658 kg)  08/13/13 189 lb 4 oz (85.843 kg)  05/19/13 184 lb 8 oz (83.689 kg)    Physical Exam  Constitutional: He appears well-developed and well-nourished. No distress.  HENT:  Mouth/Throat:  Oropharynx is clear and moist. No oropharyngeal exudate.  Neck: Normal range of motion. Neck supple. Carotid bruit is present (faint L carotid bruit). No thyromegaly present.  Cardiovascular: Normal rate, regular rhythm, normal heart sounds and intact distal pulses.   No murmur heard. Pulmonary/Chest: Effort normal and breath sounds normal. No respiratory distress. He has no wheezes. He has no rales.  Musculoskeletal: He exhibits no edema.  Psychiatric: He has a normal mood and affect.  Nursing note and vitals reviewed.      Assessment & Plan:   Problem List Items Addressed This Visit    Left carotid bruit    Discussed. Pt due for f/u but still has $ due from last [place]. Advised to call cards to discuss payment plan.      Hypertension - Primary    BP stable last several readings here. No changes indicated at this time. 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 If persistently elevated at DOT physical, I will ask pt to contact me and we can discuss bp med titration then. Refilled hyzaar 50/12.5mg  daily today.      Relevant Medications   losartan-hydrochlorothiazide (HYZAAR) 50-12.5 MG per tablet   pravastatin (PRAVACHOL) tablet   Hyperlipemia    Chronic, stable on recent FLP. Continue pravastatin. Check FLP next fasting labwork.      Relevant Medications   losartan-hydrochlorothiazide (  HYZAAR) 50-12.5 MG per tablet   pravastatin (PRAVACHOL) tablet   Colon polyp    Pt likely due for f/u. We will sign ROI for records today.          Follow up plan: Return in about 6 months (around 11/17/2014), or as needed, for annual exam, prior fasting for blood work.

## 2014-05-19 NOTE — Assessment & Plan Note (Signed)
BP stable last several readings here. No changes indicated at this time. 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 If persistently elevated at DOT physical, I will ask pt to contact me and we can discuss bp med titration then. Refilled hyzaar 50/12.5mg  daily today.

## 2014-05-19 NOTE — Addendum Note (Signed)
Addended by: Josetta Huddle on: 05/19/2014 06:18 PM   Modules accepted: Orders

## 2014-05-19 NOTE — Patient Instructions (Addendum)
Contact the heart center to talk about a payment plan.  Flu shot today. meds refilled today.  Letter for DOT provided today. Return in 6-8 months for physical. Check for records of colonoscopy at home. Sign release of records up front for colonoscopy from [facility].

## 2014-05-19 NOTE — Assessment & Plan Note (Signed)
Pt likely due for f/u. We will sign ROI for records today.

## 2015-01-17 ENCOUNTER — Ambulatory Visit (INDEPENDENT_AMBULATORY_CARE_PROVIDER_SITE_OTHER): Payer: BLUE CROSS/BLUE SHIELD | Admitting: Family Medicine

## 2015-01-17 ENCOUNTER — Encounter: Payer: Self-pay | Admitting: Family Medicine

## 2015-01-17 ENCOUNTER — Encounter: Payer: Self-pay | Admitting: *Deleted

## 2015-01-17 ENCOUNTER — Ambulatory Visit (INDEPENDENT_AMBULATORY_CARE_PROVIDER_SITE_OTHER)
Admission: RE | Admit: 2015-01-17 | Discharge: 2015-01-17 | Disposition: A | Discharge status: Home / Self Care | Payer: BLUE CROSS/BLUE SHIELD | Source: Ambulatory Visit | Attending: Family Medicine | Admitting: Family Medicine

## 2015-01-17 VITALS — BP 138/80 | HR 68 | Temp 97.8°F | Wt 189.5 lb

## 2015-01-17 DIAGNOSIS — M79605 Pain in left leg: Secondary | ICD-10-CM | POA: Diagnosis not present

## 2015-01-17 DIAGNOSIS — S82402A Unspecified fracture of shaft of left fibula, initial encounter for closed fracture: Secondary | ICD-10-CM | POA: Insufficient documentation

## 2015-01-17 DIAGNOSIS — I6523 Occlusion and stenosis of bilateral carotid arteries: Secondary | ICD-10-CM | POA: Diagnosis not present

## 2015-01-17 DIAGNOSIS — Z23 Encounter for immunization: Secondary | ICD-10-CM | POA: Diagnosis not present

## 2015-01-17 DIAGNOSIS — M79602 Pain in left arm: Secondary | ICD-10-CM | POA: Diagnosis not present

## 2015-01-17 DIAGNOSIS — L299 Pruritus, unspecified: Secondary | ICD-10-CM

## 2015-01-17 HISTORY — DX: Unspecified fracture of shaft of left fibula, initial encounter for closed fracture: S82.402A

## 2015-01-17 NOTE — Assessment & Plan Note (Signed)
Not consistent with DVT, claudication. Possible fibular injury - check xray today. Possible peroneal tendonitis. Xray on my read with fibular cortical irregularities - concern for subacute fracture. 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 RTC 2 wks for f/u. Pt agrees with plan.

## 2015-01-17 NOTE — Patient Instructions (Addendum)
Call insurance to ask about how much carotid ultrasound will cost you - this is to follow known carotid stenosis or blockage.  For scalp - try mineral oil or tea tree oil shampoo.  Xray of left leg today - I think you have fibular fracture - use cam walker boot and return for follow up in 2 weeks. I will await radiology recommendations.  I wonder about rotator cuff pain - do exercises provided today. May use ibuprofen 400mg  with food. Continue tylenol PM.  Return in 2 weeks for follow up, sooner if no improvement noted with above treatment.

## 2015-01-17 NOTE — Progress Notes (Signed)
Pre visit review using our clinic review tool, if applicable. No additional management support is needed unless otherwise documented below in the visit note. 

## 2015-01-17 NOTE — Assessment & Plan Note (Signed)
Overall normal exam. H/o xerosis of skin. rec trial of mineral oil vs tea tree oil shampoo.

## 2015-01-17 NOTE — Assessment & Plan Note (Signed)
Unable to afford carotid ultrasound last checked. I have asked him to call insurance to ask about carotid US coverage for known 40-59% bilateral stenosis.

## 2015-01-17 NOTE — Assessment & Plan Note (Signed)
Anticipate RTC injury - treat with ibuprofen 400mg  during the day as well as tylenol PM nightly. Provided with exercises from [facility] pt advisor. Update if no improvement noted.

## 2015-01-17 NOTE — Progress Notes (Signed)
BP 138/80 mmHg  Pulse 68  Temp(Src) 97.8 F (36.6 C) (Oral)  Wt 189 lb 8 oz (85.957 kg)   CC: mult issues  Subjective:    Patient ID: Mark Davidson, male    DOB: 1950-06-06, 64 y.o.   MRN: [id number]  HPI: Viktor Philipp is a 64 y.o. male presenting on 01/17/2015 for Leg Pain; Arm Pain; and Hair/Scalp Problem   3 wk h/o L calf pain, slowly improving. Has been treating with ice and elevation. Worse pain with pushing in clutch. No redness or swelling of calf. 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 Constant pain, worse at end of the day. He does lift weights with legs. Possibly strained leg while lifitng. May have felt pop lateral left leg while lifting weigths.  Also has noticed 3 wk h/o L arm pain worse with elevation above head. points to lateral upper arm.   Denies inciting trauma/injury for calf,ankle.  No fevers/chills, denies numbness/ weakness of extremities, vision changes, slurred speech, confusion.  Using heating pad and ice on calf, elevating leg.  Has tried freeze-it cream for arm.   Several month history scalp very itchy despite changing shampoos (healthy scalp, dandruff shampoo). Feeling bumps on scalp. No pain, just itching  Known h/o L carotid bruit - never f/u - too expensive ultrasound.   Relevant past medical, surgical, family and social history reviewed and updated as indicated. Interim medical history since our last visit reviewed. Allergies and medications reviewed and updated. Current Outpatient Prescriptions on File Prior to Visit  Medication Sig  . diphenhydramine-acetaminophen (TYLENOL PM) 25-500 MG TABS Take 1 tablet by mouth at bedtime as needed.  Marland Kitchen losartan-hydrochlorothiazide (HYZAAR) 50-12.5 MG per tablet Take one tablet daily  . omeprazole (PRILOSEC) 20 MG capsule TAKE 1 CAPSULE BY MOUTH TWICE DAILY (Patient taking differently: Take 20 mg by mouth daily. )  . pravastatin (PRAVACHOL) 80 MG tablet  Take one tablet at bedtime.   No current facility-administered medications on file prior to visit.    Review of Systems Per HPI unless specifically indicated above     Objective:    BP 138/80 mmHg  Pulse 68  Temp(Src) 97.8 F (36.6 C) (Oral)  Wt 189 lb 8 oz (85.957 kg)  Wt Readings from Last 3 Encounters:  01/17/15 189 lb 8 oz (85.957 kg)  05/19/14 193 lb 4 oz (87.658 kg)  08/13/13 189 lb 4 oz (85.843 kg)    Physical Exam  Constitutional: He appears well-developed and well-nourished. No distress.  HENT:  Head: Normocephalic and atraumatic.  Few papules on scalp, no significant erythema or irritation  Musculoskeletal: He exhibits no edema.  No midline spine or paracervical tenderness   R shoulder WNL  L Shoulder exam: No deformity of shoulders on inspection. No pain with palpation of shoulder landmarks. Tender to palpation lateral upper arm Limited ROM in abduction 2/2 pain No pain or weakness with testing SITS in ext/int rotation. No pain with empty can sign. Neg Speed test. No pain with rotation of humeral head in [place] joint.   2+ DP bilaterally No palpable cords, no calf tenderness. No popliteal fullness Point tender to palpation lateral mid fibular shaft  Skin: Skin is warm and dry. No rash noted.  Psychiatric: He has a normal mood and affect.  Nursing note and vitals reviewed.       Assessment & Plan:   Problem List Items Addressed This Visit    Scalp pruritus  Overall normal exam. H/o xerosis of skin. rec trial of mineral oil vs tea tree oil shampoo.      Left leg pain - Primary    Not consistent with DVT, claudication. Possible fibular injury - check xray today. Possible peroneal tendonitis. Xray on my read with fibular cortical irregularities - concern for subacute fracture. 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 RTC 2 wks  for f/u. Pt agrees with plan.      Relevant Orders   DG Tibia/Fibula Left   Left arm pain    Anticipate RTC injury - treat with ibuprofen 400mg  during the day as well as tylenol PM nightly. Provided with exercises from [facility] pt advisor. Update if no improvement noted.       Bilateral carotid artery stenosis    Unable to afford carotid ultrasound last checked. I have asked him to call insurance to ask about carotid US coverage for known 40-59% bilateral stenosis.       Other Visit Diagnoses    Need for influenza vaccination        Relevant Orders    Flu Vaccine QUAD 36+ mos PF IM (Fluarix & Fluzone Quad PF) (Completed)        Follow up plan: Return in about 2 weeks (around 01/31/2015), or if symptoms worsen or fail to improve, for follow up visit.

## 2015-01-19 ENCOUNTER — Ambulatory Visit: Payer: BLUE CROSS/BLUE SHIELD | Admitting: Family Medicine

## 2015-01-31 ENCOUNTER — Ambulatory Visit (INDEPENDENT_AMBULATORY_CARE_PROVIDER_SITE_OTHER): Payer: BLUE CROSS/BLUE SHIELD | Admitting: Family Medicine

## 2015-01-31 ENCOUNTER — Encounter: Payer: Self-pay | Admitting: *Deleted

## 2015-01-31 ENCOUNTER — Ambulatory Visit (INDEPENDENT_AMBULATORY_CARE_PROVIDER_SITE_OTHER)
Admission: RE | Admit: 2015-01-31 | Discharge: 2015-01-31 | Disposition: A | Discharge status: Home / Self Care | Payer: BLUE CROSS/BLUE SHIELD | Source: Ambulatory Visit | Attending: Family Medicine | Admitting: Family Medicine

## 2015-01-31 ENCOUNTER — Encounter: Payer: Self-pay | Admitting: Family Medicine

## 2015-01-31 VITALS — BP 144/76 | HR 80 | Temp 97.8°F | Wt 191.2 lb

## 2015-01-31 DIAGNOSIS — M79602 Pain in left arm: Secondary | ICD-10-CM

## 2015-01-31 DIAGNOSIS — S161XXA Strain of muscle, fascia and tendon at neck level, initial encounter: Secondary | ICD-10-CM | POA: Insufficient documentation

## 2015-01-31 DIAGNOSIS — S82402D Unspecified fracture of shaft of left fibula, subsequent encounter for closed fracture with routine healing: Secondary | ICD-10-CM

## 2015-01-31 MED ORDER — CYCLOBENZAPRINE HCL 10 MG PO TABS: 5.0000 mg | ORAL_TABLET | Freq: Two times a day (BID) | ORAL | Status: DC | PRN

## 2015-01-31 NOTE — Assessment & Plan Note (Signed)
Humeral xray today - negative for acute fracture. Continue treatment for RTC injury with exercises, ibuprofen, heating pad.

## 2015-01-31 NOTE — Assessment & Plan Note (Signed)
Improvement noted. Persistent pain. Continue CAM walker for 2 wks, rtc 2 wks. 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 Pt cannot drive truck with CAM walker.   Pt agrees with plan.

## 2015-01-31 NOTE — Progress Notes (Signed)
Pre visit review using our clinic review tool, if applicable. No additional management support is needed unless otherwise documented below in the visit note. 

## 2015-01-31 NOTE — Assessment & Plan Note (Signed)
sxs consistent with trapezius muscle strain. Treat with ibuprofen, rest, heating pad and flexeril. Update if not improved with treatment.

## 2015-01-31 NOTE — Progress Notes (Signed)
BP 144/76 mmHg  Pulse 80  Temp(Src) 97.8 F (36.6 C) (Oral)  Wt 191 lb 4 oz (86.75 kg)   CC: f/u presumed fibular fracture  Subjective:    Patient ID: Mark Davidson, male    DOB: 1950-07-31, 64 y.o.   MRN: [id number]  HPI: Mark Davidson is a 64 y.o. male presenting on 01/31/2015 for Follow-up   See prior note for details. Seen with 3 wk h/o L leg and L arm pain without inciting trauma/injury. Found to have subtle nondisplaced fibular fracture, placed in CAM walker boot. Notes significant improvement in arm and leg pain but still persistent. L arm pain thought to be related to RTC injury - treated with ibuprofen 400mg  + tylenol PM. Also given exercises.   Fibular fracture thought from weight lifting.   Now with R neck pain that started 2-3 days ago. Started in his sleep.   Has not been driving for last 2 weeks.   Has not been able to find out cost of carotid ultrasound so hesitant to reschedule.   Relevant past medical, surgical, family and social history reviewed and updated as indicated. Interim medical history since our last visit reviewed. Allergies and medications reviewed and updated. Current Outpatient Prescriptions on File Prior to Visit  Medication Sig  . diphenhydramine-acetaminophen (TYLENOL PM) 25-500 MG TABS Take 1 tablet by mouth at bedtime as needed.  Marland Kitchen losartan-hydrochlorothiazide (HYZAAR) 50-12.5 MG per tablet Take one tablet daily  . omeprazole (PRILOSEC) 20 MG capsule TAKE 1 CAPSULE BY MOUTH TWICE DAILY (Patient taking differently: Take 20 mg by mouth daily. )  . pravastatin (PRAVACHOL) 80 MG tablet Take one tablet at bedtime.   No current facility-administered medications on file prior to visit.    Review of Systems Per HPI unless specifically indicated above     Objective:    BP 144/76 mmHg  Pulse 80  Temp(Src) 97.8 F (36.6 C) (Oral)  Wt 191 lb 4 oz (86.75 kg)  Wt Readings from Last 3 Encounters:  01/31/15 191 lb 4 oz (86.75 kg)    01/17/15 189 lb 8 oz (85.957 kg)  05/19/14 193 lb 4 oz (87.658 kg)    Physical Exam  Constitutional: He appears well-developed and well-nourished. No distress.  Musculoskeletal: He exhibits no edema.  Right neck pain to palpation at trap muscle. ROM preserved. No midline cervical pain Persistent left upper arm pain with improved range of motion  Persistent point tenderness to palpation left proximal fibular shaft, no calf pain, no skin changes. 2+ DP on left  Nursing note and vitals reviewed.   LEFT TIBIA AND FIBULA - 2 VIEW COMPARISON: None. FINDINGS: The left tibia and fibula are in normal alignment. No fracture is seen. No periosteal reaction is noted. IMPRESSION: Negative. Electronically Signed: By: Ivar Drape M.D. On: 01/18/2015 08:40 ADDENDUM REPORT: 01/20/2015 08:01 ADDENDUM: Upon further review there appears to be a subtle nondisplaced fracture of the proximal fibula. Adjacent calcification is most likely vascular calcification as opposed periosteal reaction. Follow-up imaging in 2 weeks suggested to demonstrate healing. No focal bony lesion otherwise noted. Electronically Signed  By: Marcello Moores Register  On: 01/20/2015 08:01    Assessment & Plan:   Problem List Items Addressed This Visit    Neck strain    sxs consistent with trapezius muscle strain. Treat with ibuprofen, rest, heating pad and flexeril. Update if not improved with treatment.      Left fibular fracture - Primary    Improvement noted. Persistent pain. Continue  CAM walker for 2 wks, rtc 2 wks. 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 Pt cannot drive truck with CAM walker.   Pt agrees with plan.      Relevant Orders   DG Tibia/Fibula Left   Left arm pain    Humeral xray today - negative for acute fracture. Continue treatment for RTC injury with exercises, ibuprofen, heating pad.      Relevant Orders   DG Humerus Left       Follow up plan: Return in about  2 weeks (around 02/14/2015), or if symptoms worsen or fail to improve, for follow up visit.

## 2015-01-31 NOTE — Patient Instructions (Addendum)
Xray of leg today. As well as xray of left arm. No fracture in arm. Improving fibula in leg. I do recommend out of work another 2 weeks. Return to see me in 2 weeks.  For neck - you have cervical strain. Treat with muscle relaxant sent to pharmacy and heating pad.  Continue ibuprofen 400mg  twice daily with food and tylenol PM at night time.

## 2015-02-01 ENCOUNTER — Telehealth: Payer: Self-pay

## 2015-02-01 NOTE — Telephone Encounter (Signed)
Noted. pt has been out of work for fibular fracture.

## 2015-02-01 NOTE — Telephone Encounter (Signed)
Pt left v/m wanting to let Dr Darnell Level know as FYI that someone will be contacting [facility] for med records for short term disability.no cb needed.

## 2015-02-14 ENCOUNTER — Ambulatory Visit (INDEPENDENT_AMBULATORY_CARE_PROVIDER_SITE_OTHER): Payer: BLUE CROSS/BLUE SHIELD | Admitting: Family Medicine

## 2015-02-14 ENCOUNTER — Encounter: Payer: Self-pay | Admitting: Family Medicine

## 2015-02-14 VITALS — BP 160/86 | HR 80 | Temp 97.6°F | Wt 191.2 lb

## 2015-02-14 DIAGNOSIS — S82402D Unspecified fracture of shaft of left fibula, subsequent encounter for closed fracture with routine healing: Secondary | ICD-10-CM

## 2015-02-14 NOTE — Progress Notes (Signed)
Pre visit review using our clinic review tool, if applicable. No additional management support is needed unless otherwise documented below in the visit note. 

## 2015-02-14 NOTE — Progress Notes (Signed)
BP 160/86 mmHg  Pulse 80  Temp(Src) 97.6 F (36.4 C) (Oral)  Wt 191 lb 4 oz (86.75 kg)   CC: 2 wk f/u visit  Subjective:    Patient ID: Mark Davidson, male    DOB: [birth year], 64 y.o.   MRN: [id number]  HPI: Mark Davidson is a 64 y.o. male presenting on 02/14/2015 for Follow-up   See prior note for details. Seen with 01/17/15 with 3wks of L leg pain without inciting trauma/injury. Found to have subtle nondisplaced fibular fracture, thought from lifting weights. Placed in CAM walker boot over last month. Notes significant improvement in leg pain but still persistent. Here for 2 wk f/u visit. Continued improvement but still staying sore lateral leg.   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   bp elevated today - just took am meds.   Relevant past medical, surgical, family and social history reviewed and updated as indicated. Interim medical history since our last visit reviewed. Allergies and medications reviewed and updated. Current Outpatient Prescriptions on File Prior to Visit  Medication Sig  . diphenhydramine-acetaminophen (TYLENOL PM) 25-500 MG TABS Take 1 tablet by mouth at bedtime as needed.  Marland Kitchen losartan-hydrochlorothiazide (HYZAAR) 50-12.5 MG per tablet Take one tablet daily  . omeprazole (PRILOSEC) 20 MG capsule TAKE 1 CAPSULE BY MOUTH TWICE DAILY (Patient taking differently: Take 20 mg by mouth daily. )  . pravastatin (PRAVACHOL) 80 MG tablet Take one tablet at bedtime.  . cyclobenzaprine (FLEXERIL) 10 MG tablet Take 0.5-1 tablets (5-10 mg total) by mouth 2 (two) times daily as needed for muscle spasms (sedation precautions). (Patient not taking: Reported on 02/14/2015)   No current facility-administered medications on file prior to visit.    Review of Systems Per HPI unless specifically indicated above     Objective:    BP 160/86 mmHg  Pulse 80  Temp(Src) 97.6 F (36.4 C) (Oral)  Wt 191 lb 4 oz (86.75 kg)   Wt Readings from Last 3 Encounters:  02/14/15 191 lb 4 oz (86.75 kg)  01/31/15 191 lb 4 oz (86.75 kg)  01/17/15 189 lb 8 oz (85.957 kg)    Physical Exam  Constitutional: He is oriented to person, place, and time. He appears well-developed and well-nourished. No distress.  Musculoskeletal: He exhibits no edema.  2+ DP Persistent point tenderness at proximal fibular shaft, improving. Ankle dorsi and plantar flexion intact  Neurological: He is alert and oriented to person, place, and time. He has normal strength. No sensory deficit.  Skin: Skin is warm and dry.  Psychiatric: He has a normal mood and affect.  Nursing note and vitals reviewed.  LEFT TIBIA AND FIBULA - 2 VIEW COMPARISON: 01/17/2015. FINDINGS: Interval mild callus formation at the location of the previously seen nondisplaced proximal fibular shaft fracture. The fragments remain nondisplaced and nonangulated IMPRESSION: Healing, nondisplaced proximal fibular shaft fracture. Electronically Signed  By: Claudie Revering M.D.  On: 01/31/2015 13:39    Assessment & Plan:   Problem List Items Addressed This Visit    Left fibular fracture - Primary    Continued slowed progress.  4 wks on CAM walker boot - will d/c this, and encourage weight bearing as well as gradual progression of activity as tolerated. Discussed crutch use if ambulation is painful. He has one at home. Stay out of work for next 2-3 wks until f/u.          Follow up plan: Return in about 2  weeks (around 02/28/2015) for follow up visit.

## 2015-02-14 NOTE — Patient Instructions (Addendum)
Ok to stop cam walker boot. Weight bearing as tolerated. Gradual progression of activity as tolerated. Stay out of work until next f/u visit in 2-3 weeks.

## 2015-02-14 NOTE — Assessment & Plan Note (Signed)
Continued slowed progress.  4 wks on CAM walker boot - will d/c this, and encourage weight bearing as well as gradual progression of activity as tolerated. Discussed crutch use if ambulation is painful. He has one at home. Stay out of work for next 2-3 wks until f/u.

## 2015-02-28 ENCOUNTER — Encounter: Payer: Self-pay | Admitting: *Deleted

## 2015-02-28 ENCOUNTER — Ambulatory Visit (INDEPENDENT_AMBULATORY_CARE_PROVIDER_SITE_OTHER): Payer: BLUE CROSS/BLUE SHIELD | Admitting: Family Medicine

## 2015-02-28 ENCOUNTER — Encounter: Payer: Self-pay | Admitting: Family Medicine

## 2015-02-28 ENCOUNTER — Ambulatory Visit (INDEPENDENT_AMBULATORY_CARE_PROVIDER_SITE_OTHER)
Admission: RE | Admit: 2015-02-28 | Discharge: 2015-02-28 | Disposition: A | Discharge status: Home / Self Care | Payer: BLUE CROSS/BLUE SHIELD | Source: Ambulatory Visit | Attending: Family Medicine | Admitting: Family Medicine

## 2015-02-28 VITALS — BP 132/72 | HR 79 | Temp 97.7°F | Wt 190.2 lb

## 2015-02-28 DIAGNOSIS — S82402D Unspecified fracture of shaft of left fibula, subsequent encounter for closed fracture with routine healing: Secondary | ICD-10-CM | POA: Diagnosis not present

## 2015-02-28 NOTE — Patient Instructions (Addendum)
Xray today. Extend out of work note until 03/14/2015.  Ok to use heating pad on leg. Ok to use ibuprofen /tylenol as needed. Continue staying active walking.  Try to schedule physical and f/u visit in 2 wks

## 2015-02-28 NOTE — Assessment & Plan Note (Signed)
Continued slow progress, now off boot.  Out of work for 2 more weeks, recheck in 2 wks.  Xray today to monitor healing.

## 2015-02-28 NOTE — Progress Notes (Signed)
Pre visit review using our clinic review tool, if applicable. No additional management support is needed unless otherwise documented below in the visit note. 

## 2015-02-28 NOTE — Progress Notes (Signed)
   BP 132/72 mmHg  Pulse 79  Temp(Src) 97.7 F (36.5 C)  Wt 190 lb 4 oz (86.297 kg)   CC: f/u visit  Subjective:    Patient ID: Mark Davidson, male    DOB: [birth year], 64 y.o.   MRN: [id number]  HPI: Mark Davidson is a 64 y.o. male presenting on 02/28/2015 for Follow-up   See prior note for details. Seen 01/17/15 with 3 wks of L leg pain without inciting trauma/injury. Found to have subtle nondisplaced fibular fracture, thought from lifting weights and aggravated by repetitive clutch use at work. Placed in CAM walker boot for 1 month. Notes significant improvement in leg pain but still persistent. Here for 2 wk f/u visit. Continued improvement but still staying sore lateral leg.   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   sxs started ~12/29/2014.   Relevant past medical, surgical, family and social history reviewed and updated as indicated. Interim medical history since our last visit reviewed. Allergies and medications reviewed and updated. Current Outpatient Prescriptions on File Prior to Visit  Medication Sig  . diphenhydramine-acetaminophen (TYLENOL PM) 25-500 MG TABS Take 1 tablet by mouth at bedtime as needed.  Marland Kitchen losartan-hydrochlorothiazide (HYZAAR) 50-12.5 MG per tablet Take one tablet daily  . omeprazole (PRILOSEC) 20 MG capsule TAKE 1 CAPSULE BY MOUTH TWICE DAILY (Patient taking differently: Take 20 mg by mouth daily. )  . cyclobenzaprine (FLEXERIL) 10 MG tablet Take 0.5-1 tablets (5-10 mg total) by mouth 2 (two) times daily as needed for muscle spasms (sedation precautions). (Patient not taking: Reported on 02/14/2015)  . pravastatin (PRAVACHOL) 80 MG tablet Take one tablet at bedtime.   No current facility-administered medications on file prior to visit.    Review of Systems Per HPI unless specifically indicated in ROS section     Objective:    BP 132/72 mmHg  Pulse 79  Temp(Src) 97.7 F (36.5 C)  Wt 190 lb 4  oz (86.297 kg)  Wt Readings from Last 3 Encounters:  02/28/15 190 lb 4 oz (86.297 kg)  02/14/15 191 lb 4 oz (86.75 kg)  01/31/15 191 lb 4 oz (86.75 kg)    Physical Exam  Constitutional: He is oriented to person, place, and time. He appears well-developed and well-nourished. No distress.  Musculoskeletal: He exhibits no edema.  2+ DP Persistent point tenderness at proximal fibular shaft, improving. Ankle dorsi and plantar flexion intact Marked tenderness with inversion of ankle against resistance.   Neurological: He is alert and oriented to person, place, and time. He has normal strength. No sensory deficit.  Skin: Skin is warm and dry.  Psychiatric: He has a normal mood and affect.  Nursing note and vitals reviewed.     Assessment & Plan:   Problem List Items Addressed This Visit    Left fibular fracture - Primary    Continued slow progress, now off boot.  Out of work for 2 more weeks, recheck in 2 wks.  Xray today to monitor healing.      Relevant Orders   DG Tibia/Fibula Left       Follow up plan: Return in about 2 weeks (around 03/14/2015), or as needed, for annual exam, prior fasting for blood work.

## 2015-03-06 ENCOUNTER — Encounter: Payer: Self-pay | Admitting: *Deleted

## 2015-03-08 ENCOUNTER — Other Ambulatory Visit: Payer: Self-pay | Admitting: Family Medicine

## 2015-03-08 DIAGNOSIS — E785 Hyperlipidemia, unspecified: Secondary | ICD-10-CM

## 2015-03-08 DIAGNOSIS — Z125 Encounter for screening for malignant neoplasm of prostate: Secondary | ICD-10-CM

## 2015-03-08 DIAGNOSIS — I10 Essential (primary) hypertension: Secondary | ICD-10-CM

## 2015-03-09 ENCOUNTER — Other Ambulatory Visit (INDEPENDENT_AMBULATORY_CARE_PROVIDER_SITE_OTHER): Payer: BLUE CROSS/BLUE SHIELD

## 2015-03-09 DIAGNOSIS — E785 Hyperlipidemia, unspecified: Secondary | ICD-10-CM

## 2015-03-09 DIAGNOSIS — Z125 Encounter for screening for malignant neoplasm of prostate: Secondary | ICD-10-CM

## 2015-03-09 DIAGNOSIS — I10 Essential (primary) hypertension: Secondary | ICD-10-CM | POA: Diagnosis not present

## 2015-03-09 LAB — BASIC METABOLIC PANEL
BUN: 16 mg/dL (ref 6–23)
CALCIUM: 10 mg/dL (ref 8.4–10.5)
CO2: 27 mEq/L (ref 19–32)
CREATININE: 1.02 mg/dL (ref 0.40–1.50)
Chloride: 98 mEq/L (ref 96–112)
GFR: 78.61 mL/min (ref >60.00)
Glucose, Bld: 121 mg/dL — ABNORMAL HIGH (ref 70–99)
Potassium: 4.6 mEq/L (ref 3.5–5.1)
Sodium: 137 mEq/L (ref 135–145)

## 2015-03-09 LAB — PSA: PSA: 0.69 ng/mL (ref 0.10–4.00)

## 2015-03-09 LAB — LIPID PANEL
CHOL/HDL RATIO: 4
Cholesterol: 204 mg/dL — ABNORMAL HIGH (ref 0–200)
HDL: 48.4 mg/dL (ref >39.00)
LDL CALC: 134 mg/dL — AB (ref 0–99)
NONHDL: 155.85
TRIGLYCERIDES: 111 mg/dL (ref 0.0–149.0)
VLDL: 22.2 mg/dL (ref 0.0–40.0)

## 2015-03-14 ENCOUNTER — Encounter: Payer: Self-pay | Admitting: Family Medicine

## 2015-03-14 ENCOUNTER — Encounter: Payer: Self-pay | Admitting: *Deleted

## 2015-03-14 ENCOUNTER — Ambulatory Visit (INDEPENDENT_AMBULATORY_CARE_PROVIDER_SITE_OTHER): Payer: BLUE CROSS/BLUE SHIELD | Admitting: Family Medicine

## 2015-03-14 VITALS — BP 156/82 | HR 76 | Temp 97.9°F | Ht 70.0 in | Wt 192.0 lb

## 2015-03-14 DIAGNOSIS — Z0000 Encounter for general adult medical examination without abnormal findings: Secondary | ICD-10-CM

## 2015-03-14 DIAGNOSIS — S82402D Unspecified fracture of shaft of left fibula, subsequent encounter for closed fracture with routine healing: Secondary | ICD-10-CM

## 2015-03-14 DIAGNOSIS — I10 Essential (primary) hypertension: Secondary | ICD-10-CM | POA: Diagnosis not present

## 2015-03-14 DIAGNOSIS — K635 Polyp of colon: Secondary | ICD-10-CM

## 2015-03-14 DIAGNOSIS — Z1211 Encounter for screening for malignant neoplasm of colon: Secondary | ICD-10-CM

## 2015-03-14 DIAGNOSIS — R0981 Nasal congestion: Secondary | ICD-10-CM | POA: Insufficient documentation

## 2015-03-14 DIAGNOSIS — Z0001 Encounter for general adult medical examination with abnormal findings: Secondary | ICD-10-CM | POA: Insufficient documentation

## 2015-03-14 DIAGNOSIS — E785 Hyperlipidemia, unspecified: Secondary | ICD-10-CM | POA: Diagnosis not present

## 2015-03-14 DIAGNOSIS — R319 Hematuria, unspecified: Secondary | ICD-10-CM

## 2015-03-14 DIAGNOSIS — Z720 Tobacco use: Secondary | ICD-10-CM | POA: Insufficient documentation

## 2015-03-14 DIAGNOSIS — I6523 Occlusion and stenosis of bilateral carotid arteries: Secondary | ICD-10-CM

## 2015-03-14 LAB — POCT URINALYSIS DIPSTICK
BILIRUBIN UA: NEGATIVE
Glucose, UA: NEGATIVE
Ketones, UA: NEGATIVE
LEUKOCYTES UA: NEGATIVE
NITRITE UA: NEGATIVE
PH UA: 5.5
Protein, UA: NEGATIVE
RBC UA: NEGATIVE
Spec Grav, UA: >=1.03
UROBILINOGEN UA: 0.2

## 2015-03-14 MED ORDER — OMEPRAZOLE 20 MG PO CPDR: 20.0000 mg | DELAYED_RELEASE_CAPSULE | Freq: Two times a day (BID) | ORAL | Status: DC

## 2015-03-14 MED ORDER — LORATADINE 10 MG PO TABS: 10.0000 mg | ORAL_TABLET | Freq: Every day | ORAL | Status: DC

## 2015-03-14 MED ORDER — ATORVASTATIN CALCIUM 40 MG PO TABS: 40.0000 mg | ORAL_TABLET | Freq: Every day | ORAL | Status: DC

## 2015-03-14 MED ORDER — PRAVASTATIN SODIUM 80 MG PO TABS: ORAL_TABLET | ORAL | Status: DC

## 2015-03-14 MED ORDER — LOSARTAN POTASSIUM-HCTZ 50-12.5 MG PO TABS: ORAL_TABLET | ORAL | Status: DC

## 2015-03-14 NOTE — Assessment & Plan Note (Signed)
Preventative protocols reviewed and updated unless pt declined. Discussed healthy diet and lifestyle.  

## 2015-03-14 NOTE — Assessment & Plan Note (Signed)
Anticipate related to allergic rhinitis - rec start claritin + nasal saline daily.

## 2015-03-14 NOTE — Progress Notes (Signed)
BP 156/82 mmHg  Pulse 76  Temp(Src) 97.9 F (36.6 C) (Oral)  Ht 5\' 10"  (1.778 m)  Wt 192 lb (87.091 kg)  BMI 27.55 kg/m2   CC: CPE  Subjective:    Patient ID: Mark Davidson, male    DOB: [birth year], 64 y.o.   MRN: [id number]  HPI: Mark Davidson is a 64 y.o. male presenting on 03/14/2015 for Annual Exam   "sinuses acting up" over last 2 days. PNDrainage, rhinorrhea, sinus congestion, itchy watery eys, cough. Treating with vicks salve. H/o intermittent seasonal allergies.   H/o hematuria on DOT.   HTN - ran out of BP med today. Doesn't check at home. Does check at drugstore - 130/80s. Persistent mild cough with losartan but not as bad as ACEI.   Moderate carotid stenosis by [place] 2013 - has been unable to rpt due to cost  See prior note for details. Seen 01/17/15 with 3 wks of L leg pain without inciting trauma/injury. Found to have subtle nondisplaced fibular fracture, thought from lifting weights and aggravated by repetitive clutch use at work. Placed in CAM walker boot for 1 month. Notes significant improvement in leg pain but still persistent with walking. Here for 2 wk f/u visit as well.  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  sxs started ~12/29/2014.   Preventative: Colon cancer screening - pt thinks he had colonoscopy with polyps (?2010). Agrees to iFOB today. Prostate cancer screening - discussed. Requests checking today.  Flu shot yearly Tetanus - 2012  Caffeine: 4-5 sodas/day, 1 cup coffee/day Lives with wife, 1 dog. No children Occupation: Administrator Edu: GED Activity: stays active, mowing grass, walking and lifting weights. Diet: poor fruits and vegetables, good water intake. Lots of red meat  Relevant past medical, surgical, family and social history reviewed and updated as indicated. Interim medical history since our last visit reviewed. Allergies and medications reviewed and updated. Current Outpatient  Prescriptions on File Prior to Visit  Medication Sig  . diphenhydramine-acetaminophen (TYLENOL PM) 25-500 MG TABS Take 1 tablet by mouth at bedtime as needed.   No current facility-administered medications on file prior to visit.    Review of Systems  Constitutional: Negative for fever, chills, activity change, appetite change, fatigue and unexpected weight change.  HENT: Positive for congestion, postnasal drip, rhinorrhea, sinus pressure and sneezing. Negative for hearing loss.   Eyes: Negative for visual disturbance.  Respiratory: Negative for cough, chest tightness, shortness of breath and wheezing.   Cardiovascular: Negative for chest pain, palpitations and leg swelling.  Gastrointestinal: Negative for nausea, vomiting, abdominal pain, diarrhea, constipation, blood in stool and abdominal distention.  Genitourinary: Positive for hematuria (per DOT physical). Negative for difficulty urinating.  Musculoskeletal: Negative for myalgias, arthralgias and neck pain.  Skin: Negative for rash.  Neurological: Negative for dizziness, seizures, syncope and headaches.  Hematological: Negative for adenopathy. Does not bruise/bleed easily.  Psychiatric/Behavioral: Negative for dysphoric mood. The patient is not nervous/anxious.    Per HPI unless specifically indicated in ROS section     Objective:    BP 156/82 mmHg  Pulse 76  Temp(Src) 97.9 F (36.6 C) (Oral)  Ht 5\' 10"  (1.778 m)  Wt 192 lb (87.091 kg)  BMI 27.55 kg/m2  Wt Readings from Last 3 Encounters:  03/14/15 192 lb (87.091 kg)  02/28/15 190 lb 4 oz (86.297 kg)  02/14/15 191 lb 4 oz (86.75 kg)    Physical Exam  Constitutional: He is oriented  to person, place, and time. He appears well-developed and well-nourished. No distress.  HENT:  Head: Normocephalic and atraumatic.  Right Ear: Hearing, tympanic membrane, external ear and ear canal normal.  Left Ear: Hearing, tympanic membrane, external ear and ear canal normal.  Nose: Nose  normal.  Mouth/Throat: Uvula is midline, oropharynx is clear and moist and mucous membranes are normal. No oropharyngeal exudate, posterior oropharyngeal edema or posterior oropharyngeal erythema.  Eyes: Conjunctivae and EOM are normal. Pupils are equal, round, and reactive to light. No scleral icterus.  Neck: Normal range of motion. Neck supple. Carotid bruit is present (bilat). No thyromegaly present.  Cardiovascular: Normal rate, regular rhythm, normal heart sounds and intact distal pulses.   No murmur heard. Pulses:      Radial pulses are 2+ on the right side, and 2+ on the left side.  Pulmonary/Chest: Effort normal and breath sounds normal. No respiratory distress. He has no wheezes. He has no rales.  Abdominal: Soft. Bowel sounds are normal. He exhibits no distension and no mass. There is no tenderness. There is no rebound and no guarding.  Genitourinary: Rectum normal and prostate normal. Rectal exam shows no external hemorrhoid, no internal hemorrhoid, no fissure, no mass, no tenderness and anal tone normal. Prostate is not enlarged (15gm) and not tender.  Musculoskeletal: Normal range of motion. He exhibits no edema.  Lymphadenopathy:    He has no cervical adenopathy.  Neurological: He is alert and oriented to person, place, and time.  CN grossly intact, station and gait intact  Skin: Skin is warm and dry. No rash noted.  Psychiatric: He has a normal mood and affect. His behavior is normal. Judgment and thought content normal.  Nursing note and vitals reviewed.  Results for orders placed or performed in visit on 03/09/15  Lipid panel  Result Value Ref Range   Cholesterol 204 (H) 0 - 200 mg/dL   Triglycerides 111.0 0.0 - 149.0 mg/dL   HDL 48.40 >39.00 mg/dL   VLDL 22.2 0.0 - 40.0 mg/dL   LDL Cholesterol 134 (H) 0 - 99 mg/dL   Total CHOL/HDL Ratio 4    NonHDL [id number]   Basic metabolic panel  Result Value Ref Range   Sodium 137 135 - 145 mEq/L   Potassium 4.6 3.5 - 5.1 mEq/L    Chloride 98 96 - 112 mEq/L   CO2 27 19 - 32 mEq/L   Glucose, Bld 121 (H) 70 - 99 mg/dL   BUN 16 6 - 23 mg/dL   Creatinine, Ser 1.02 0.40 - 1.50 mg/dL   Calcium 10.0 8.4 - 10.5 mg/dL   GFR 78.61 >60.00 mL/min  PSA  Result Value Ref Range   PSA 0.69 0.10 - 4.00 ng/mL      Assessment & Plan:  Hep C screen next visit Problem List Items Addressed This Visit    Smokeless tobacco use    Discussed oral health.      Sinus congestion    Anticipate related to allergic rhinitis - rec start claritin + nasal saline daily.      Left fibular fracture    Continued healing noted. RTW date discussed, set for 03/27/2015.      Hypertension    Elevated today - pt ran out of medication. Refilled.       Relevant Medications   losartan-hydrochlorothiazide (HYZAAR) 50-12.5 MG tablet   atorvastatin (LIPITOR) 40 MG tablet   Hyperlipemia    Reviewed FLP. LDL not at goal despite pravastatin 80mg  daily. Will change  to lipitor 40mg  daily when ne runs out of current pills. Pt agrees.      Relevant Medications   losartan-hydrochlorothiazide (HYZAAR) 50-12.5 MG tablet   atorvastatin (LIPITOR) 40 MG tablet   Hematuria    Recurrent hematuria on DOT physicals. Will recheck today.      Health maintenance examination - Primary    Preventative protocols reviewed and updated unless pt declined. Discussed healthy diet and lifestyle.       Colon polyp    Sign ROI for colonoscopy today. Pt hesitant for colonoscopy - worried about cost. Will check iFOB today.      Bilateral carotid artery stenosis    Overdue for recheck. Working on finances to be able to afford this. In interim, increase statin potency.      Relevant Medications   losartan-hydrochlorothiazide (HYZAAR) 50-12.5 MG tablet   atorvastatin (LIPITOR) 40 MG tablet    Other Visit Diagnoses    Special screening for malignant neoplasms, colon        Relevant Orders    Fecal occult blood, imunochemical        Follow up plan: Return as  needed.

## 2015-03-14 NOTE — Assessment & Plan Note (Signed)
Discussed oral health.

## 2015-03-14 NOTE — Assessment & Plan Note (Signed)
Continued healing noted. RTW date discussed, set for 03/27/2015.

## 2015-03-14 NOTE — Progress Notes (Signed)
Pre visit review using our clinic review tool, if applicable. No additional management support is needed unless otherwise documented below in the visit note. 

## 2015-03-14 NOTE — Assessment & Plan Note (Signed)
Overdue for recheck. Working on finances to be able to afford this. In interim, increase statin potency.

## 2015-03-14 NOTE — Assessment & Plan Note (Signed)
Sign ROI for colonoscopy today. Pt hesitant for colonoscopy - worried about cost. Will check iFOB today.

## 2015-03-14 NOTE — Patient Instructions (Addendum)
Urinalysis today for blood in urine. Change cholesterol medicine from pravastatin to atorvastatin (lipitor) - new medicine sent to pharmacy. For congestion - start claritin or zyrtec daily, start nasal saline. If persistent congestion past 10 days or worsening headache or fever or drainage, let us know.  Sign release of records of colonoscopy report up front. Pass by lab for stool kit Good to see you today, call us with questions.  Return to work date 03/27/2015.   Health Maintenance, Male A healthy lifestyle and preventative care can promote health and wellness.  Maintain regular health, dental, and eye exams.  Eat a healthy diet. Foods like vegetables, fruits, whole grains, low-fat dairy products, and lean protein foods contain the nutrients you need and are low in calories. Decrease your intake of foods high in solid fats, added sugars, and salt. Get information about a proper diet from your health care provider, if necessary.  Regular physical exercise is one of the most important things you can do for your health. Most adults should get at least 150 minutes of moderate-intensity exercise (any activity that increases your heart rate and causes you to sweat) each week. In addition, most adults need muscle-strengthening exercises on 2 or more days a week.   Maintain a healthy weight. The body mass index (BMI) is a screening tool to identify possible weight problems. It provides an estimate of body fat based on height and weight. Your health care provider can find your BMI and can help you achieve or maintain a healthy weight. For males 20 years and older:  A BMI below 18.5 is considered underweight.  A BMI of 18.5 to 24.9 is normal.  A BMI of 25 to 29.9 is considered overweight.  A BMI of 30 and above is considered obese.  Maintain normal blood lipids and cholesterol by exercising and minimizing your intake of saturated fat. Eat a balanced diet with plenty of fruits and vegetables.  Blood tests for lipids and cholesterol should begin at age 4 and be repeated every 5 years. If your lipid or cholesterol levels are high, you are over age 51, or you are at high risk for heart disease, you may need your cholesterol levels checked more frequently.Ongoing high lipid and cholesterol levels should be treated with medicines if diet and exercise are not working.  If you smoke, find out from your health care provider how to quit. If you do not use tobacco, do not start.  Lung cancer screening is recommended for adults aged 1-80 years who are at high risk for developing lung cancer because of a history of smoking. A yearly low-dose CT scan of the lungs is recommended for people who have at least a 30-pack-year history of smoking and are current smokers or have quit within the past 15 years. A pack year of smoking is smoking an average of 1 pack of cigarettes a day for 1 year (for example, a 30-pack-year history of smoking could mean smoking 1 pack a day for 30 years or 2 packs a day for 15 years). Yearly screening should continue until the smoker has stopped smoking for at least 15 years. Yearly screening should be stopped for people who develop a health problem that would prevent them from having lung cancer treatment.  If you choose to drink alcohol, do not have more than 2 drinks per day. One drink is considered to be 12 oz (360 mL) of beer, 5 oz (150 mL) of wine, or 1.5 oz (45 mL) of liquor.  Avoid the use of street drugs. Do not share needles with anyone. Ask for help if you need support or instructions about stopping the use of drugs.  High blood pressure causes heart disease and increases the risk of stroke. High blood pressure is more likely to develop in:  People who have blood pressure in the end of the normal range (100-139/85-89 mm Hg).  People who are overweight or obese.  People who are African American.  If you are 28-61 years of age, have your blood pressure checked  every 3-5 years. If you are 5 years of age or older, have your blood pressure checked every year. You should have your blood pressure measured twice--once when you are at a hospital or clinic, and once when you are not at a hospital or clinic. Record the average of the two measurements. To check your blood pressure when you are not at a hospital or clinic, you can use:  An automated blood pressure machine at a pharmacy.  A home blood pressure monitor.  If you are 72-60 years old, ask your health care provider if you should take aspirin to prevent heart disease.  Diabetes screening involves taking a blood sample to check your fasting blood sugar level. This should be done once every 3 years after age 73 if you are at a normal weight and without risk factors for diabetes. Testing should be considered at a younger age or be carried out more frequently if you are overweight and have at least 1 risk factor for diabetes.  Colorectal cancer can be detected and often prevented. Most routine colorectal cancer screening begins at the age of 82 and continues through age 60. However, your health care provider may recommend screening at an earlier age if you have risk factors for colon cancer. On a yearly basis, your health care provider may provide home test kits to check for hidden blood in the stool. A small camera at the end of a tube may be used to directly examine the colon (sigmoidoscopy or colonoscopy) to detect the earliest forms of colorectal cancer. Talk to your health care provider about this at age 77 when routine screening begins. A direct exam of the colon should be repeated every 5-10 years through age 71, unless early forms of precancerous polyps or small growths are found.  People who are at an increased risk for hepatitis B should be screened for this virus. You are considered at high risk for hepatitis B if:  You were born in a country where hepatitis B occurs often. Talk with your health care  provider about which countries are considered high risk.  Your parents were born in a high-risk country and you have not received a shot to protect against hepatitis B (hepatitis B vaccine).  You have HIV or AIDS.  You use needles to inject street drugs.  You live with, or have sex with, someone who has hepatitis B.  You are a man who has sex with other men (MSM).  You get hemodialysis treatment.  You take certain medicines for conditions like cancer, organ transplantation, and autoimmune conditions.  Hepatitis C blood testing is recommended for all people born from [birth year] through 1963 and any individual with known risk factors for hepatitis C.  Healthy men should no longer receive prostate-specific antigen (PSA) blood tests as part of routine cancer screening. Talk to your health care provider about prostate cancer screening.  Testicular cancer screening is not recommended for adolescents or adult males who  have no symptoms. Screening includes self-exam, a health care provider exam, and other screening tests. Consult with your health care provider about any symptoms you have or any concerns you have about testicular cancer.  Practice safe sex. Use condoms and avoid high-risk sexual practices to reduce the spread of sexually transmitted infections (STIs).  You should be screened for STIs, including gonorrhea and chlamydia if:  You are sexually active and are younger than 24 years.  You are older than 24 years, and your health care provider tells you that you are at risk for this type of infection.  Your sexual activity has changed since you were last screened, and you are at an increased risk for chlamydia or gonorrhea. Ask your health care provider if you are at risk.  If you are at risk of being infected with HIV, it is recommended that you take a prescription medicine daily to prevent HIV infection. This is called pre-exposure prophylaxis (PrEP). You are considered at risk if:  You  are a man who has sex with other men (MSM).  You are a heterosexual man who is sexually active with multiple partners.  You take drugs by injection.  You are sexually active with a partner who has HIV.  Talk with your health care provider about whether you are at high risk of being infected with HIV. If you choose to begin PrEP, you should first be tested for HIV. You should then be tested every 3 months for as long as you are taking PrEP.  Use sunscreen. Apply sunscreen liberally and repeatedly throughout the day. You should seek shade when your shadow is shorter than you. Protect yourself by wearing long sleeves, pants, a wide-brimmed hat, and sunglasses year round whenever you are outdoors.  Tell your health care provider of new moles or changes in moles, especially if there is a change in shape or color. Also, tell your health care provider if a mole is larger than the size of a pencil eraser.  A one-time screening for abdominal aortic aneurysm (AAA) and surgical repair of large AAAs by ultrasound is recommended for men aged 56-75 years who are current or former smokers.  Stay current with your vaccines (immunizations).   This information is not intended to replace advice given to you by your health care provider. Make sure you discuss any questions you have with your health care provider.   Document Released: 10/12/2007 Document Revised: 05/06/2014 Document Reviewed: 09/10/2010 Elsevier Interactive Patient Education [employer].

## 2015-03-14 NOTE — Assessment & Plan Note (Signed)
Elevated today - pt ran out of medication. Refilled.

## 2015-03-14 NOTE — Assessment & Plan Note (Signed)
Reviewed FLP. LDL not at goal despite pravastatin 80mg  daily. Will change to lipitor 40mg  daily when ne runs out of current pills. Pt agrees.

## 2015-03-14 NOTE — Addendum Note (Signed)
Addended by: Royann Shivers A on: 03/14/2015 09:26 AM   Modules accepted: Orders

## 2015-03-14 NOTE — Assessment & Plan Note (Signed)
Recurrent hematuria on DOT physicals. Will recheck today.

## 2015-03-20 ENCOUNTER — Encounter: Payer: Self-pay | Admitting: *Deleted

## 2015-08-28 ENCOUNTER — Other Ambulatory Visit: Payer: Self-pay | Admitting: Family Medicine

## 2016-03-03 ENCOUNTER — Other Ambulatory Visit: Payer: Self-pay | Admitting: Family Medicine

## 2016-03-04 NOTE — Telephone Encounter (Signed)
See allergy/contrainication Okay to refill?

## 2016-03-17 ENCOUNTER — Other Ambulatory Visit: Payer: Self-pay | Admitting: Family Medicine

## 2016-05-26 ENCOUNTER — Other Ambulatory Visit: Payer: Self-pay | Admitting: Family Medicine

## 2016-06-02 ENCOUNTER — Other Ambulatory Visit: Payer: Self-pay | Admitting: Family Medicine

## 2016-06-24 ENCOUNTER — Other Ambulatory Visit: Payer: Self-pay | Admitting: Family Medicine

## 2016-07-01 ENCOUNTER — Other Ambulatory Visit: Payer: Self-pay | Admitting: Family Medicine

## 2016-07-06 ENCOUNTER — Other Ambulatory Visit: Payer: Self-pay | Admitting: Family Medicine

## 2016-07-10 ENCOUNTER — Other Ambulatory Visit: Payer: Self-pay | Admitting: Family Medicine

## 2016-07-12 ENCOUNTER — Encounter: Payer: Self-pay | Admitting: Family Medicine

## 2016-07-12 ENCOUNTER — Ambulatory Visit (INDEPENDENT_AMBULATORY_CARE_PROVIDER_SITE_OTHER): Payer: BLUE CROSS/BLUE SHIELD | Admitting: Family Medicine

## 2016-07-12 VITALS — BP 138/80 | HR 75 | Temp 97.6°F | Wt 182.8 lb

## 2016-07-12 DIAGNOSIS — Z1159 Encounter for screening for other viral diseases: Secondary | ICD-10-CM

## 2016-07-12 DIAGNOSIS — E785 Hyperlipidemia, unspecified: Secondary | ICD-10-CM | POA: Diagnosis not present

## 2016-07-12 DIAGNOSIS — Z1211 Encounter for screening for malignant neoplasm of colon: Secondary | ICD-10-CM | POA: Diagnosis not present

## 2016-07-12 DIAGNOSIS — I10 Essential (primary) hypertension: Secondary | ICD-10-CM

## 2016-07-12 MED ORDER — ATORVASTATIN CALCIUM 40 MG PO TABS: 40.0000 mg | ORAL_TABLET | Freq: Every day | ORAL | 3 refills | Status: DC

## 2016-07-12 MED ORDER — OMEPRAZOLE 20 MG PO CPDR: 20.0000 mg | DELAYED_RELEASE_CAPSULE | Freq: Every day | ORAL | 3 refills | Status: DC

## 2016-07-12 MED ORDER — LOSARTAN POTASSIUM-HCTZ 50-12.5 MG PO TABS: 1.0000 | ORAL_TABLET | Freq: Every day | ORAL | 3 refills | Status: DC

## 2016-07-12 NOTE — Patient Instructions (Addendum)
Pass by lab for stool kit and labs.  Medicines refilled today. Try to space out omeprazole heartburn medicine if tolerated. Good to see you today, call us with quesitons. Return as needed or in 6-12 months for physical

## 2016-07-12 NOTE — Progress Notes (Signed)
BP 138/80 (BP Location: Right Arm, Patient Position: Sitting, Cuff Size: Normal)   Pulse 75   Temp 97.6 F (36.4 C) (Oral)   Wt 182 lb 12 oz (82.9 kg)   SpO2 97%   BMI 26.22 kg/m    CC: med refill visit Subjective:    Patient ID: Mark Davidson, male    DOB: [birth year], 66 y.o.   MRN: [id number]  HPI: Mark Davidson is a 66 y.o. male presenting on 07/12/2016 for Medication Refill   Last seen 02/2015. Gets DOT physical yearly.   HTN - Compliant with current antihypertensive regimen of losartan hctz 50/12.5mg  daily. Does not check blood pressures at home. No low blood pressure readings or symptoms of dizziness/syncope. Denies HA, vision changes, CP/tightness, SOB, leg swelling.   HLD - compliant with atorvastatin 40mg  daily without myalgias.   Not fasting today.   Relevant past medical, surgical, family and social history reviewed and updated as indicated. Interim medical history since our last visit reviewed. Allergies and medications reviewed and updated. Outpatient Medications Prior to Visit  Medication Sig Dispense Refill  . diphenhydramine-acetaminophen (TYLENOL PM) 25-500 MG TABS Take 1 tablet by mouth at bedtime as needed.    Marland Kitchen atorvastatin (LIPITOR) 40 MG tablet TAKE 1 TABLET (40 MG TOTAL) BY MOUTH DAILY. 30 tablet 0  . atorvastatin (LIPITOR) 40 MG tablet TAKE 1 TABLET (40 MG TOTAL) BY MOUTH DAILY*PT NEEDS O/V** 15 tablet 0  . atorvastatin (LIPITOR) 40 MG tablet Take 1 tablet (40 mg total) by mouth daily. 30 tablet 0  . losartan-hydrochlorothiazide (HYZAAR) 50-12.5 MG tablet TAKE ONE TABLET DAILY 90 tablet 0  . omeprazole (PRILOSEC) 20 MG capsule TAKE 1 CAPSULE (20 MG TOTAL) BY MOUTH 2 (TWO) TIMES DAILY BEFORE A MEAL.**NEEDS APPT** 60 capsule 0  . loratadine (CLARITIN) 10 MG tablet Take 1 tablet (10 mg total) by mouth daily. (Patient not taking: Reported on 07/12/2016) 30 tablet 11  . losartan-hydrochlorothiazide (HYZAAR) 50-12.5 MG tablet TAKE 1 TABLET BY MOUTH  DAILY 30 tablet 0  . omeprazole (PRILOSEC) 20 MG capsule TAKE 1 CAPSULE (20 MG TOTAL) BY MOUTH 2 (TWO) TIMES DAILY BEFORE A MEAL. 60 capsule 0   No facility-administered medications prior to visit.      Per HPI unless specifically indicated in ROS section below Review of Systems     Objective:    BP 138/80 (BP Location: Right Arm, Patient Position: Sitting, Cuff Size: Normal)   Pulse 75   Temp 97.6 F (36.4 C) (Oral)   Wt 182 lb 12 oz (82.9 kg)   SpO2 97%   BMI 26.22 kg/m   Wt Readings from Last 3 Encounters:  07/12/16 182 lb 12 oz (82.9 kg)  03/14/15 192 lb (87.1 kg)  02/28/15 190 lb 4 oz (86.3 kg)    Physical Exam  Constitutional: He appears well-developed and well-nourished. No distress.  HENT:  Mouth/Throat: Oropharynx is clear and moist. No oropharyngeal exudate.  Eyes: Conjunctivae and EOM are normal. Pupils are equal, round, and reactive to light.  Cardiovascular: Normal rate, regular rhythm, normal heart sounds and intact distal pulses.   No murmur heard. Pulmonary/Chest: Effort normal and breath sounds normal. No respiratory distress. He has no wheezes. He has no rales.  Musculoskeletal: He exhibits no edema.  Skin: Skin is warm and dry. No rash noted.  Psychiatric: He has a normal mood and affect.  Nursing note and vitals reviewed.     Assessment & Plan:   Problem List Items Addressed  This Visit    Hyperlipemia    Not fasting today. Check dLDL today. Continue lipitor 40mg  daily.       Relevant Medications   atorvastatin (LIPITOR) 40 MG tablet   losartan-hydrochlorothiazide (HYZAAR) 50-12.5 MG tablet   Other Relevant Orders   LDL Cholesterol, Direct   Hypertension - Primary    Chronic, stable. Continue current regimen. Refilled today.       Relevant Medications   atorvastatin (LIPITOR) 40 MG tablet   losartan-hydrochlorothiazide (HYZAAR) 50-12.5 MG tablet   Other Relevant Orders   Basic metabolic panel   LDL Cholesterol, Direct    Other Visit  Diagnoses    Special screening for malignant neoplasms, colon       Relevant Orders   Fecal occult blood, imunochemical   Need for hepatitis C screening test       Relevant Orders   Hepatitis C antibody       Follow up plan: Return in about 6 months (around 01/12/2017) for annual exam, prior fasting for blood work.  [name], MD

## 2016-07-12 NOTE — Assessment & Plan Note (Addendum)
Not fasting today. Check dLDL today. Continue lipitor 40mg  daily.

## 2016-07-12 NOTE — Assessment & Plan Note (Signed)
Chronic, stable. Continue current regimen. Refilled today. 

## 2016-07-13 LAB — BASIC METABOLIC PANEL
BUN: 12 mg/dL (ref 7–25)
CALCIUM: 9.7 mg/dL (ref 8.6–10.3)
CO2: 26 mmol/L (ref 20–31)
CREATININE: 1.2 mg/dL (ref 0.70–1.25)
Chloride: 96 mmol/L — ABNORMAL LOW (ref 98–110)
Glucose, Bld: 277 mg/dL — ABNORMAL HIGH (ref 65–99)
Potassium: 3.7 mmol/L (ref 3.5–5.3)
Sodium: 135 mmol/L (ref 135–146)

## 2016-07-13 LAB — LDL CHOLESTEROL, DIRECT: LDL DIRECT: 85 mg/dL (ref <130)

## 2016-07-13 LAB — HEPATITIS C ANTIBODY: HCV Ab: NEGATIVE

## 2016-07-14 ENCOUNTER — Encounter: Payer: Self-pay | Admitting: Family Medicine

## 2016-07-14 DIAGNOSIS — E119 Type 2 diabetes mellitus without complications: Secondary | ICD-10-CM | POA: Insufficient documentation

## 2016-07-14 DIAGNOSIS — E118 Type 2 diabetes mellitus with unspecified complications: Secondary | ICD-10-CM | POA: Insufficient documentation

## 2016-07-14 DIAGNOSIS — E1169 Type 2 diabetes mellitus with other specified complication: Secondary | ICD-10-CM | POA: Insufficient documentation

## 2016-08-04 ENCOUNTER — Other Ambulatory Visit: Payer: Self-pay | Admitting: Family Medicine

## 2016-08-13 ENCOUNTER — Ambulatory Visit (INDEPENDENT_AMBULATORY_CARE_PROVIDER_SITE_OTHER): Payer: BLUE CROSS/BLUE SHIELD | Admitting: Family Medicine

## 2016-08-13 ENCOUNTER — Encounter: Payer: Self-pay | Admitting: Family Medicine

## 2016-08-13 VITALS — BP 136/78 | HR 60 | Temp 97.9°F | Wt 180.2 lb

## 2016-08-13 DIAGNOSIS — IMO0001 Reserved for inherently not codable concepts without codable children: Secondary | ICD-10-CM

## 2016-08-13 DIAGNOSIS — E1165 Type 2 diabetes mellitus with hyperglycemia: Secondary | ICD-10-CM

## 2016-08-13 NOTE — Progress Notes (Signed)
BP 136/78   Pulse 60   Temp 97.9 F (36.6 C) (Oral)   Wt 180 lb 4 oz (81.8 kg)   BMI 25.86 kg/m    CC: f/u visit Subjective:    Patient ID: Mark Davidson, male    DOB: 07-24-1950, 66 y.o.   MRN: [id number]  HPI: Mark Davidson is a 66 y.o. male presenting on 08/13/2016 for Follow-up   See recent labs for details. Seen here last month with new dx diabetes - sugar was 277.   He was drinking up to 5 pepsi's a day as well as sweets.  He has made healthy diet changes - he has decreased pepsi to 1 a day, drinking diet pepsi and water in its place. Also has cut back on breads.  Weight down 2 lbs.   Relevant past medical, surgical, family and social history reviewed and updated as indicated. Interim medical history since our last visit reviewed. Allergies and medications reviewed and updated. Outpatient Medications Prior to Visit  Medication Sig Dispense Refill  . atorvastatin (LIPITOR) 40 MG tablet Take 1 tablet (40 mg total) by mouth daily. 90 tablet 3  . diphenhydramine-acetaminophen (TYLENOL PM) 25-500 MG TABS Take 1 tablet by mouth at bedtime as needed.    Marland Kitchen losartan-hydrochlorothiazide (HYZAAR) 50-12.5 MG tablet Take 1 tablet by mouth daily. 90 tablet 3  . omeprazole (PRILOSEC) 20 MG capsule Take 1 capsule (20 mg total) by mouth daily. 90 capsule 3   No facility-administered medications prior to visit.      Per HPI unless specifically indicated in ROS section below Review of Systems     Objective:    BP 136/78   Pulse 60   Temp 97.9 F (36.6 C) (Oral)   Wt 180 lb 4 oz (81.8 kg)   BMI 25.86 kg/m   Wt Readings from Last 3 Encounters:  08/13/16 180 lb 4 oz (81.8 kg)  07/12/16 182 lb 12 oz (82.9 kg)  03/14/15 192 lb (87.1 kg)    Physical Exam  Constitutional: He appears well-developed and well-nourished. No distress.  Musculoskeletal: He exhibits no edema.  Skin: Skin is warm and dry. No rash noted.  Psychiatric: He has a normal mood and affect.  Nursing  note and vitals reviewed.  Results for orders placed or performed in visit on 17/40/81  Basic metabolic panel  Result Value Ref Range   Sodium 135 135 - 146 mmol/L   Potassium 3.7 3.5 - 5.3 mmol/L   Chloride 96 (L) 98 - 110 mmol/L   CO2 26 20 - 31 mmol/L   Glucose, Bld 277 (H) 65 - 99 mg/dL   BUN 12 7 - 25 mg/dL   Creat 1.20 0.70 - 1.25 mg/dL   Calcium 9.7 8.6 - 10.3 mg/dL  LDL Cholesterol, Direct  Result Value Ref Range   Direct LDL 85 <130 mg/dL  Hepatitis C antibody  Result Value Ref Range   HCV Ab NEGATIVE NEGATIVE      Assessment & Plan:   Problem List Items Addressed This Visit    Diabetes mellitus type 2, uncontrolled, without complications ([place]) - Primary    Reviewed high sugar reading with patient, discussed concerns with development of diabetes given random sugar >200. He endorses increased pepsi and sweets that day. Regardless, still concern for diabetes - will check A1c and microalbumin today.  Discussed pathophysiology of diabetes as well as management recommendations.  RTC 3 mo CPE and rpt labs.       Relevant  Orders   Microalbumin / creatinine urine ratio   Hemoglobin A1c       Follow up plan: Return in about 3 months (around 11/12/2016) for follow up visit.  [name], MD

## 2016-08-13 NOTE — Patient Instructions (Addendum)
Work on [employer] changes as discussed today.  Labs today (A1c).  Return in 3 months for physical.  Try to schedule regular meal times.   Diabetes Mellitus and Food It is important for you to manage your blood sugar (glucose) level. Your blood glucose level can be greatly affected by what you eat. Eating healthier foods in the appropriate amounts throughout the day at about the same time each day will help you control your blood glucose level. It can also help slow or prevent worsening of your diabetes mellitus. Healthy eating may even help you improve the level of your blood pressure and reach or maintain a healthy weight. General recommendations for healthful eating and cooking habits include:  Eating meals and snacks regularly. Avoid going long periods of time without eating to lose weight.  Eating a diet that consists mainly of plant-based foods, such as fruits, vegetables, nuts, legumes, and whole grains.  Using low-heat cooking methods, such as baking, instead of high-heat cooking methods, such as deep frying. Work with your dietitian to make sure you understand how to use the Nutrition Facts information on food labels. How can food affect me? Carbohydrates  Carbohydrates affect your blood glucose level more than any other type of food. Your dietitian will help you determine how many carbohydrates to eat at each meal and teach you how to count carbohydrates. Counting carbohydrates is important to keep your blood glucose at a healthy level, especially if you are using insulin or taking certain medicines for diabetes mellitus. Alcohol  Alcohol can cause sudden decreases in blood glucose (hypoglycemia), especially if you use insulin or take certain medicines for diabetes mellitus. Hypoglycemia can be a life-threatening condition. Symptoms of hypoglycemia (sleepiness, dizziness, and disorientation) are similar to symptoms of having too much alcohol. If your health care provider has given you  approval to drink alcohol, do so in moderation and use the following guidelines:  Women should not have more than one drink per day, and men should not have more than two drinks per day. One drink is equal to:  12 oz of beer.  5 oz of wine.  1 oz of hard liquor.  Do not drink on an empty stomach.  Keep yourself hydrated. Have water, diet soda, or unsweetened iced tea.  Regular soda, juice, and other mixers might contain a lot of carbohydrates and should be counted. What foods are not recommended? As you make food choices, it is important to remember that all foods are not the same. Some foods have fewer nutrients per serving than other foods, even though they might have the same number of calories or carbohydrates. It is difficult to get your body what it needs when you eat foods with fewer nutrients. Examples of foods that you should avoid that are high in calories and carbohydrates but low in nutrients include:  Trans fats (most processed foods list trans fats on the Nutrition Facts label).  Regular soda.  Juice.  Candy.  Sweets, such as cake, pie, doughnuts, and cookies.  Fried foods. What foods can I eat? Eat nutrient-rich foods, which will nourish your body and keep you healthy. The food you should eat also will depend on several factors, including:  The calories you need.  The medicines you take.  Your weight.  Your blood glucose level.  Your blood pressure level.  Your cholesterol level. You should eat a variety of foods, including:  Protein.  Lean cuts of meat.  Proteins low in saturated fats, such as  fish, egg whites, and beans. Avoid processed meats.  Fruits and vegetables.  Fruits and vegetables that may help control blood glucose levels, such as apples, mangoes, and yams.  Dairy products.  Choose fat-free or low-fat dairy products, such as milk, yogurt, and cheese.  Grains, bread, pasta, and rice.  Choose whole grain products, such as  multigrain bread, whole oats, and brown rice. These foods may help control blood pressure.  Fats.  Foods containing healthful fats, such as nuts, avocado, olive oil, canola oil, and fish. Does everyone with diabetes mellitus have the same meal plan? Because every person with diabetes mellitus is different, there is not one meal plan that works for everyone. It is very important that you meet with a dietitian who will help you create a meal plan that is just right for you. This information is not intended to replace advice given to you by your health care provider. Make sure you discuss any questions you have with your health care provider. Document Released: 01/10/2005 Document Revised: 09/21/2015 Document Reviewed: 03/12/2013 Elsevier Interactive Patient Education  2017 [employer].

## 2016-08-13 NOTE — Progress Notes (Signed)
Pre visit review using our clinic review tool, if applicable. No additional management support is needed unless otherwise documented below in the visit note. 

## 2016-08-13 NOTE — Assessment & Plan Note (Signed)
Reviewed high sugar reading with patient, discussed concerns with development of diabetes given random sugar >200. He endorses increased pepsi and sweets that day. Regardless, still concern for diabetes - will check A1c and microalbumin today.  Discussed pathophysiology of diabetes as well as management recommendations.  RTC 3 mo CPE and rpt labs.

## 2016-08-14 LAB — MICROALBUMIN / CREATININE URINE RATIO
Creatinine,U: 52.8 mg/dL
Microalb Creat Ratio: 1.3 mg/g (ref 0.0–30.0)
Microalb, Ur: <0.7 mg/dL (ref 0.0–1.9)

## 2016-08-14 LAB — HEMOGLOBIN A1C: HEMOGLOBIN A1C: 8.8 % — AB (ref 4.6–6.5)

## 2016-08-20 ENCOUNTER — Other Ambulatory Visit: Payer: Self-pay | Admitting: Family Medicine

## 2016-08-20 MED ORDER — METFORMIN HCL 500 MG PO TABS: 500.0000 mg | ORAL_TABLET | Freq: Every day | ORAL | 3 refills | Status: DC

## 2016-08-21 ENCOUNTER — Telehealth: Payer: Self-pay

## 2016-08-21 NOTE — Telephone Encounter (Signed)
Please mail diabetic diet. Letter written and in Kim's box.

## 2016-08-21 NOTE — Telephone Encounter (Signed)
Pt recently starting on Metformin and wants to know if has to take med at same time every day. I advised pt is better to take med as close to same time every day to keep level amt of med in system. Pt voiced understanding. Pt request a packet of info for diabetic diet mailed to verified home address. Pt also needs letter for employer that it is safe for pt to drive since he has been dx as diabetes and also needs copy of office visit when dx with diabetes. Pt will sign record release when picks up letter and copy of office note. Call pt when ready for pick up.

## 2016-08-22 NOTE — Telephone Encounter (Signed)
Patient notified and letter placed up front for pick up along with diabetes diet as requested by patient.

## 2016-11-12 ENCOUNTER — Other Ambulatory Visit: Payer: Self-pay | Admitting: Family Medicine

## 2016-11-12 DIAGNOSIS — E1165 Type 2 diabetes mellitus with hyperglycemia: Secondary | ICD-10-CM

## 2016-11-12 DIAGNOSIS — E785 Hyperlipidemia, unspecified: Secondary | ICD-10-CM

## 2016-11-12 DIAGNOSIS — Z125 Encounter for screening for malignant neoplasm of prostate: Secondary | ICD-10-CM

## 2016-11-12 DIAGNOSIS — IMO0001 Reserved for inherently not codable concepts without codable children: Secondary | ICD-10-CM

## 2016-11-14 ENCOUNTER — Other Ambulatory Visit (INDEPENDENT_AMBULATORY_CARE_PROVIDER_SITE_OTHER): Payer: BLUE CROSS/BLUE SHIELD

## 2016-11-14 DIAGNOSIS — E1165 Type 2 diabetes mellitus with hyperglycemia: Secondary | ICD-10-CM

## 2016-11-14 DIAGNOSIS — IMO0001 Reserved for inherently not codable concepts without codable children: Secondary | ICD-10-CM

## 2016-11-14 DIAGNOSIS — Z125 Encounter for screening for malignant neoplasm of prostate: Secondary | ICD-10-CM | POA: Diagnosis not present

## 2016-11-14 DIAGNOSIS — E785 Hyperlipidemia, unspecified: Secondary | ICD-10-CM

## 2016-11-14 LAB — COMPREHENSIVE METABOLIC PANEL
ALBUMIN: 4.1 g/dL (ref 3.5–5.2)
ALT: 21 U/L (ref 0–53)
AST: 14 U/L (ref 0–37)
Alkaline Phosphatase: 78 U/L (ref 39–117)
BUN: 19 mg/dL (ref 6–23)
CALCIUM: 9.9 mg/dL (ref 8.4–10.5)
CHLORIDE: 101 meq/L (ref 96–112)
CO2: 30 meq/L (ref 19–32)
Creatinine, Ser: 0.96 mg/dL (ref 0.40–1.50)
GFR: 83.85 mL/min (ref >60.00)
Glucose, Bld: 133 mg/dL — ABNORMAL HIGH (ref 70–99)
POTASSIUM: 4.9 meq/L (ref 3.5–5.1)
SODIUM: 138 meq/L (ref 135–145)
Total Bilirubin: 0.8 mg/dL (ref 0.2–1.2)
Total Protein: 6.6 g/dL (ref 6.0–8.3)

## 2016-11-14 LAB — LIPID PANEL
CHOL/HDL RATIO: 3
CHOLESTEROL: 128 mg/dL (ref 0–200)
HDL: 42.7 mg/dL (ref >39.00)
LDL CALC: 70 mg/dL (ref 0–99)
NonHDL: 85.55
Triglycerides: 80 mg/dL (ref 0.0–149.0)
VLDL: 16 mg/dL (ref 0.0–40.0)

## 2016-11-14 LAB — TSH: TSH: 0.56 u[IU]/mL (ref 0.35–4.50)

## 2016-11-14 LAB — HEMOGLOBIN A1C: HEMOGLOBIN A1C: 6.1 % (ref 4.6–6.5)

## 2016-11-14 LAB — PSA: PSA: 1.06 ng/mL (ref 0.10–4.00)

## 2016-11-15 ENCOUNTER — Telehealth: Payer: Self-pay | Admitting: Family Medicine

## 2016-11-15 NOTE — Telephone Encounter (Signed)
Pt returned your call.  

## 2016-11-18 ENCOUNTER — Telehealth: Payer: Self-pay | Admitting: Family Medicine

## 2016-11-18 NOTE — Telephone Encounter (Signed)
Results reviewed with pt, and he was instructed to schedule CPX at his convenience per Dr. Darnell Level to review results in detail. He did not schedule appt while I was on the phone with him.

## 2016-11-18 NOTE — Telephone Encounter (Signed)
Pt returned call regarding labs. Please call [phone] Thanks

## 2016-11-21 ENCOUNTER — Encounter: Payer: BLUE CROSS/BLUE SHIELD | Admitting: Family Medicine

## 2017-01-09 ENCOUNTER — Other Ambulatory Visit: Payer: BLUE CROSS/BLUE SHIELD

## 2017-01-16 ENCOUNTER — Encounter: Payer: BLUE CROSS/BLUE SHIELD | Admitting: Family Medicine

## 2017-01-25 ENCOUNTER — Emergency Department
Admission: EM | Admit: 2017-01-25 | Discharge: 2017-01-25 | Disposition: A | Discharge status: Home / Self Care | Payer: BLUE CROSS/BLUE SHIELD | Attending: Emergency Medicine | Admitting: Emergency Medicine

## 2017-01-25 ENCOUNTER — Emergency Department: Payer: BLUE CROSS/BLUE SHIELD

## 2017-01-25 ENCOUNTER — Encounter: Payer: Self-pay | Admitting: Emergency Medicine

## 2017-01-25 DIAGNOSIS — Z7984 Long term (current) use of oral hypoglycemic drugs: Secondary | ICD-10-CM | POA: Insufficient documentation

## 2017-01-25 DIAGNOSIS — I10 Essential (primary) hypertension: Secondary | ICD-10-CM | POA: Diagnosis not present

## 2017-01-25 DIAGNOSIS — R509 Fever, unspecified: Secondary | ICD-10-CM | POA: Diagnosis not present

## 2017-01-25 DIAGNOSIS — E119 Type 2 diabetes mellitus without complications: Secondary | ICD-10-CM | POA: Diagnosis not present

## 2017-01-25 DIAGNOSIS — F17220 Nicotine dependence, chewing tobacco, uncomplicated: Secondary | ICD-10-CM | POA: Diagnosis not present

## 2017-01-25 DIAGNOSIS — R6889 Other general symptoms and signs: Secondary | ICD-10-CM

## 2017-01-25 LAB — URINALYSIS, COMPLETE (UACMP) WITH MICROSCOPIC
BILIRUBIN URINE: NEGATIVE
Glucose, UA: NEGATIVE mg/dL
Hgb urine dipstick: NEGATIVE
KETONES UR: NEGATIVE mg/dL
LEUKOCYTES UA: NEGATIVE
NITRITE: NEGATIVE
PH: 6 (ref 5.0–8.0)
Protein, ur: NEGATIVE mg/dL
RBC / HPF: NONE SEEN RBC/hpf (ref 0–5)
Specific Gravity, Urine: 1.005 (ref 1.005–1.030)

## 2017-01-25 LAB — CBC
HEMATOCRIT: 37.9 % — AB (ref 40.0–52.0)
HEMOGLOBIN: 13 g/dL (ref 13.0–18.0)
MCH: 30.6 pg (ref 26.0–34.0)
MCHC: 34.2 g/dL (ref 32.0–36.0)
MCV: 89.6 fL (ref 80.0–100.0)
Platelets: 135 10*3/uL — ABNORMAL LOW (ref 150–440)
RBC: 4.23 MIL/uL — ABNORMAL LOW (ref 4.40–5.90)
RDW: 13.4 % (ref 11.5–14.5)
WBC: 8.6 10*3/uL (ref 3.8–10.6)

## 2017-01-25 LAB — BASIC METABOLIC PANEL
ANION GAP: 9 (ref 5–15)
BUN: 21 mg/dL — AB (ref 6–20)
CALCIUM: 8.8 mg/dL — AB (ref 8.9–10.3)
CHLORIDE: 103 mmol/L (ref 101–111)
CO2: 23 mmol/L (ref 22–32)
CREATININE: 0.97 mg/dL (ref 0.61–1.24)
GLUCOSE: 134 mg/dL — AB (ref 65–99)
Potassium: 3.5 mmol/L (ref 3.5–5.1)
SODIUM: 135 mmol/L (ref 135–145)

## 2017-01-25 MED ORDER — SODIUM CHLORIDE 0.9 % IV BOLUS (SEPSIS)
1000.0000 mL | Freq: Once | INTRAVENOUS | Status: AC
  Administered 2017-01-25: 1000 mL via INTRAVENOUS

## 2017-01-25 NOTE — Discharge Instructions (Signed)
Return to the ER for new or worsening shaking, confusion or change in mental status, worsening or persistent fever or chills, or any other new or worsening symptoms that concern you. Follow up with your primary care doctor.

## 2017-01-25 NOTE — ED Provider Notes (Signed)
[facility] Emergency Department Provider Note ____________________________________________   First MD Initiated Contact with Patient 01/25/17 1208     (approximate)  I have reviewed the triage vital signs and the nursing notes.   HISTORY  Chief Complaint Shaking    HPI Mark Davidson is a 66 y.o. male with past history of diabetes, hypertension, and other PMH as noted below who presents with shakes since last night, occurring in 2 episodes. Described as generalized shaking of his body that he cannot control, not associated with any loss of consciousness. Patient states that the first episode happened overnight and EMS was called. When they arrived they found he had a low-grade fever of 99.1 and otherwise normal vital signs, and gave him Tylenol. He did not come to the hospital at that time. Patient then got up, ate, and felt better but had a second episode of this shaking this morning. Patient reports feeling "feverish" and achy for the last day and reports some mild upper abdominal discomfort a few days ago that resolved. Otherwise denies any cough, urinary symptoms, vomiting, diarrhea, or any other acute symptoms or recent illness. No prior history of this type of shaking episode. Patient did not feel like he was given a pass out.   Past Medical History:  Diagnosis Date  . Arthritis   . Carotid stenosis   . Colon polyp   . Diabetes mellitus type 2, uncontrolled, without complications ([place])   . ED (erectile dysfunction)   . Hyperlipemia   . Hypertension     Patient Active Problem List   Diagnosis Date Noted  . Diabetes mellitus type 2, uncontrolled, without complications ([place])   . Health maintenance examination 03/14/2015  . Sinus congestion 03/14/2015  . Hematuria 03/14/2015  . Smokeless tobacco use 03/14/2015  . Left fibular fracture 01/17/2015  . Scalp pruritus 01/17/2015  . Bilateral carotid artery stenosis 03/23/2012  . Xerosis of skin  06/28/2011  . ED (erectile dysfunction)   . Vision problem 01/08/2011  . Constipation 01/08/2011  . Hypertension   . Hyperlipemia   . Colon polyp     Past Surgical History:  Procedure Laterality Date  . CARDIAC CATHETERIZATION  2009   normal per pt  . carotid US  03/2012   bilateral 40-59% stenosis, rec rpt 1 yr  . TONSILLECTOMY AND ADENOIDECTOMY      Prior to Admission medications   Medication Sig Start Date End Date Taking? Authorizing Provider  atorvastatin (LIPITOR) 40 MG tablet Take 1 tablet (40 mg total) by mouth daily. 07/12/16   [name], MD  diphenhydramine-acetaminophen (TYLENOL PM) 25-500 MG TABS Take 1 tablet by mouth at bedtime as needed.    [provider]  losartan-hydrochlorothiazide (HYZAAR) 50-12.5 MG tablet Take 1 tablet by mouth daily. 07/12/16   [name], MD  metFORMIN (GLUCOPHAGE) 500 MG tablet Take 1 tablet (500 mg total) by mouth daily with breakfast. 08/20/16   [name], MD  omeprazole (PRILOSEC) 20 MG capsule Take 1 capsule (20 mg total) by mouth daily. 07/12/16   [name], MD    Allergies Ace inhibitors  Family History  Problem Relation Age of Onset  . Rheum arthritis Father   . Hyperlipidemia Father   . Heart disease Father   . Stroke Father   . Diabetes Father   . Arthritis Father   . Coronary artery disease Father   . Breast cancer Mother   . Cancer Mother        breast  .  Heart disease Brother   . Coronary artery disease Brother   . Hyperlipidemia Brother   . Diabetes Brother     Social History Social History  Substance Use Topics  . Smoking status: Never Smoker  . Smokeless tobacco: Current User    Types: Snuff  . Alcohol use No    Review of Systems  Constitutional: Positive for subjective fever.  Eyes: No redness. ENT: No sore throat. Cardiovascular: Denies chest pain. Respiratory: Denies shortness of breath or cough.  Gastrointestinal: No nausea, no vomiting.  No diarrhea.    Genitourinary: Negative for dysuria.  Musculoskeletal: Negative for back pain. Skin: Negative for rash. Neurological: Negative for headache.   ____________________________________________   PHYSICAL EXAM:  VITAL SIGNS: ED Triage Vitals  Enc Vitals Group     BP 01/25/17 1123 (!) 123/48     Pulse Rate 01/25/17 1123 98     Resp 01/25/17 1123 16     Temp 01/25/17 1123 99.6 F (37.6 C)     Temp Source 01/25/17 1123 Oral     SpO2 01/25/17 1123 97 %     Weight 01/25/17 1124 163 lb (73.9 kg)     Height --      Head Circumference --      Peak Flow --      Pain Score 01/25/17 1123 0     Pain Loc --      Pain Edu? --      Excl. in [place]? --     Constitutional: Alert and oriented. Well appearing and in no acute distress. Eyes: Conjunctivae are normal. EOMI.  PERRLA.   Head: Atraumatic. Nose: No congestion/rhinnorhea. Mouth/Throat: Mucous membranes are moist.   Neck: Normal range of motion.  Cardiovascular: Normal rate, regular rhythm. Grossly normal heart sounds.  Good peripheral circulation. Respiratory: Normal respiratory effort.  No retractions. Lungs CTAB. Gastrointestinal: Soft and nontender. No distention.  Genitourinary: No CVA tenderness. Musculoskeletal: No lower extremity edema.  Extremities warm and well perfused.  Neurologic:  Normal speech and language. No gross focal neurologic deficits are appreciated.  Skin:  Skin is warm and dry. No rash noted. Psychiatric: Mood and affect are normal. Speech and behavior are normal.  ____________________________________________   LABS (all labs ordered are listed, but only abnormal results are displayed)  Labs Reviewed  CBC - Abnormal; Notable for the following:       Result Value   RBC 4.23 (*)    HCT 37.9 (*)    Platelets 135 (*)    All other components within normal limits  BASIC METABOLIC PANEL - Abnormal; Notable for the following:    Glucose, Bld 134 (*)    BUN 21 (*)    Calcium 8.8 (*)    All other components  within normal limits  URINALYSIS, COMPLETE (UACMP) WITH MICROSCOPIC   ____________________________________________  EKG  ED ECG REPORT I, Arta Silence, the attending physician, personally viewed and interpreted this ECG.  Date: 01/25/2017 EKG Time: 1244 Rate: 79 Rhythm: normal sinus rhythm QRS Axis: normal Intervals: normal ST/T Wave abnormalities: normal Narrative Interpretation: no evidence of acute ischemia  ____________________________________________  RADIOLOGY  CXR shows mild bronchitic changes with no other focal abnormalities.   ____________________________________________   PROCEDURES  Procedure(s) performed: No    Critical Care performed: No ____________________________________________   INITIAL IMPRESSION / ASSESSMENT AND PLAN / ED COURSE  Pertinent labs & imaging results that were available during my care of the patient were reviewed by me and considered in my medical decision  making (see chart for details).  66 year old male with past medical history as noted presents with 2 episodes of shakes since last night, associated with low-grade temperature and with subjective fever. Patient states he felt better after Tylenol given by EMS. He has otherwise negative review of systems currently. In ED patient has low-grade temperature of 99.6, other vital signs are normal, and exam is otherwise unremarkable; patient is extremely well appearing. Overall his presentation is most consistent with rigors given the patient has the low-grade temperature and describes a whole body shaking during which he was completely conscious. The fact that it resolved after Tylenol is also consistent. There is no evidence of seizure given that the shaking was generalized but patient was conscious and there was no post ictal phase. Patient denies syncope or near syncope during these episodes. Suspect most likely viral infection given feeling of aches and fever with no other focal  symptoms, however given patient's age we will obtain chest x-ray and UA to rule out other source of infection. Also obtain basic labs to rule out electrolyte abnormality or other metabolic cause of his symptoms. Anticipate discharge home if workup negative.    ----------------------------------------- 3:12 PM on 01/25/2017 -----------------------------------------  Patient remains asymptomatic in the ED and has had no further shaking episodes. Vital signs are stable. Patient's lab workup and imaging are unremarkable. I discussed with patient most likely possibility of a viral syndrome with resulting rigors. Patient given return precautions and instructed to follow up with his primary care doctor.  ____________________________________________   FINAL CLINICAL IMPRESSION(S) / ED DIAGNOSES  Final diagnoses:  None      NEW MEDICATIONS STARTED DURING THIS VISIT:  New Prescriptions   No medications on file     Note:  This document was prepared using Dragon voice recognition software and may include unintentional dictation errors.    Arta Silence, MD 01/25/17 [phone]

## 2017-01-25 NOTE — ED Triage Notes (Signed)
Pt to ed with c/o shaking "violently" during the night.  Per pt ems came to his house and evaluated him.  Reports all VS were normal at that time.  States this am had another shaking episode.  Pt denies pain at this time. Denies fever, denies sob.  Pt states "I would like to get blood work done or something to figure out why I am having these shaking episodes."

## 2017-01-28 ENCOUNTER — Encounter: Payer: Self-pay | Admitting: Family Medicine

## 2017-01-28 ENCOUNTER — Ambulatory Visit (INDEPENDENT_AMBULATORY_CARE_PROVIDER_SITE_OTHER): Payer: BLUE CROSS/BLUE SHIELD | Admitting: Family Medicine

## 2017-01-28 VITALS — BP 126/64 | HR 60 | Temp 97.7°F | Wt 158.0 lb

## 2017-01-28 DIAGNOSIS — R634 Abnormal weight loss: Secondary | ICD-10-CM | POA: Diagnosis not present

## 2017-01-28 DIAGNOSIS — E119 Type 2 diabetes mellitus without complications: Secondary | ICD-10-CM

## 2017-01-28 DIAGNOSIS — R6889 Other general symptoms and signs: Secondary | ICD-10-CM | POA: Diagnosis not present

## 2017-01-28 DIAGNOSIS — Z23 Encounter for immunization: Secondary | ICD-10-CM

## 2017-01-28 DIAGNOSIS — L299 Pruritus, unspecified: Secondary | ICD-10-CM | POA: Diagnosis not present

## 2017-01-28 LAB — CBC WITH DIFFERENTIAL/PLATELET
BASOS PCT: 1 % (ref 0.0–3.0)
Basophils Absolute: 0.1 10*3/uL (ref 0.0–0.1)
EOS PCT: 4.1 % (ref 0.0–5.0)
Eosinophils Absolute: 0.2 10*3/uL (ref 0.0–0.7)
HCT: 42.2 % (ref 39.0–52.0)
HEMOGLOBIN: 14 g/dL (ref 13.0–17.0)
Lymphocytes Relative: 27.1 % (ref 12.0–46.0)
Lymphs Abs: 1.6 10*3/uL (ref 0.7–4.0)
MCHC: 33.1 g/dL (ref 30.0–36.0)
MCV: 90.9 fl (ref 78.0–100.0)
MONOS PCT: 18 % — AB (ref 3.0–12.0)
Monocytes Absolute: 1.1 10*3/uL — ABNORMAL HIGH (ref 0.1–1.0)
Neutro Abs: 2.9 10*3/uL (ref 1.4–7.7)
Neutrophils Relative %: 49.8 % (ref 43.0–77.0)
PLATELETS: 161 10*3/uL (ref 150.0–400.0)
RBC: 4.65 Mil/uL (ref 4.22–5.81)
RDW: 13.7 % (ref 11.5–15.5)
WBC: 5.9 10*3/uL (ref 4.0–10.5)

## 2017-01-28 LAB — HEPATIC FUNCTION PANEL
ALBUMIN: 4 g/dL (ref 3.5–5.2)
ALK PHOS: 77 U/L (ref 39–117)
ALT: 50 U/L (ref 0–53)
AST: 25 U/L (ref 0–37)
BILIRUBIN TOTAL: 0.5 mg/dL (ref 0.2–1.2)
Bilirubin, Direct: 0.1 mg/dL (ref 0.0–0.3)
Total Protein: 7.2 g/dL (ref 6.0–8.3)

## 2017-01-28 LAB — TSH: TSH: 0.67 u[IU]/mL (ref 0.35–4.50)

## 2017-01-28 LAB — SEDIMENTATION RATE: SED RATE: 2 mm/h (ref 0–20)

## 2017-01-28 NOTE — Assessment & Plan Note (Signed)
rec tea tree oil shampoo, start using hat when out in sun (frequently)

## 2017-01-28 NOTE — Assessment & Plan Note (Signed)
Significant weight loss, pt attributes to healthy diet changes in an effort to control recently diagnosed diabetes. Advised to moderately liberalize diet, will reassess at CPE (in next 1-2 months) and if ongoing weight loss low threshold for imaging to eval other cause of weight loss. Check CBC with diff today as well as TSH, LFTs and ESR.

## 2017-01-28 NOTE — Assessment & Plan Note (Signed)
Reviewed latest A1c - improved control on metformin and with weight loss.

## 2017-01-28 NOTE — Assessment & Plan Note (Signed)
Thought viral illness related. No further shaking episode since ER visit. Reviewed with patient.

## 2017-01-28 NOTE — Patient Instructions (Addendum)
Flu shot today. Labs today.  Try to taper off omeprazole (prilosec) to every other day if able. Ok to be a little looser with your diet choices - ok to drink regular soda every once in a while. Make sure you're eating 3 meals a day, protein with each meal.  Return for physical in 1-2 months.  Let us know if ongoing weight loss.  Trial zantac 150mg  nightly.

## 2017-01-28 NOTE — Progress Notes (Signed)
BP 126/64 (BP Location: Left Arm, Patient Position: Sitting, Cuff Size: Normal)   Pulse 60   Temp 97.7 F (36.5 C) (Oral)   Wt 158 lb (71.7 kg)   SpO2 98%   BMI 22.67 kg/m    CC: ER f/u visit Subjective:    Patient ID: Mark Davidson, male    DOB: Feb 27, 1951, 66 y.o.   MRN: [id number]  HPI: Mark Davidson is a 66 y.o. male presenting on 01/28/2017 for Follow-up (Seen at [facility] ED 01/25/17 due severe tremors)   Recently seen at [facility] ER with severe uncontrolled shaking - thought tremors due to rigors from low grade fever from viral illness. Records reviewed.   CXR and UA were reassuring. (mild bronchitic changes).  Labs were reassuring as well.   22 lb weight loss noted over the last 5 months, intentional per patient since recent dx diabetes. He has cut out all sodas, only drinking diet drinks. Eating normally, good appetite. No night sweats, fatigue, swollen glands, easy bruising or bleeding. Changed to wheat bread. Persistent itching of scalp worse at night, but not after a shower.  Lab Results  Component Value Date   HGBA1C 6.1 11/14/2016     Malaise all day yesterday. Some persistent GI upset. Denies dyspnea, chest pain, headaches, bowel changes, nausea/vomiting, blood in stool or urine. No dysphagia.   Trouble getting out of work to seek medical care. Continues dipping tobacco.   Relevant past medical, surgical, family and social history reviewed and updated as indicated. Interim medical history since our last visit reviewed. Allergies and medications reviewed and updated. Outpatient Medications Prior to Visit  Medication Sig Dispense Refill  . atorvastatin (LIPITOR) 40 MG tablet Take 1 tablet (40 mg total) by mouth daily. 90 tablet 3  . diphenhydramine-acetaminophen (TYLENOL PM) 25-500 MG TABS Take 1 tablet by mouth at bedtime as needed.    Marland Kitchen losartan-hydrochlorothiazide (HYZAAR) 50-12.5 MG tablet Take 1 tablet by mouth daily. 90 tablet 3  . metFORMIN (GLUCOPHAGE)  500 MG tablet Take 1 tablet (500 mg total) by mouth daily with breakfast. 90 tablet 3  . omeprazole (PRILOSEC) 20 MG capsule Take 1 capsule (20 mg total) by mouth daily. 90 capsule 3   No facility-administered medications prior to visit.     Past Medical History:  Diagnosis Date  . Arthritis   . Carotid stenosis   . Colon polyp   . Diabetes mellitus type 2, uncontrolled, without complications ([place])   . ED (erectile dysfunction)   . Hyperlipemia   . Hypertension     Past Surgical History:  Procedure Laterality Date  . CARDIAC CATHETERIZATION  2009   normal per pt  . carotid US  03/2012   bilateral 40-59% stenosis, rec rpt 1 yr  . TONSILLECTOMY AND ADENOIDECTOMY      Social History  Substance Use Topics  . Smoking status: Never Smoker  . Smokeless tobacco: Current User    Types: Snuff  . Alcohol use No    Family History  Problem Relation Age of Onset  . Rheum arthritis Father   . Hyperlipidemia Father   . Heart disease Father   . Stroke Father   . Diabetes Father   . Arthritis Father   . Coronary artery disease Father   . Breast cancer Mother   . Cancer Mother        breast  . Heart disease Brother   . Coronary artery disease Brother   . Hyperlipidemia Brother   . Diabetes  Brother     Per HPI unless specifically indicated in ROS section below Review of Systems     Objective:    BP 126/64 (BP Location: Left Arm, Patient Position: Sitting, Cuff Size: Normal)   Pulse 60   Temp 97.7 F (36.5 C) (Oral)   Wt 158 lb (71.7 kg)   SpO2 98%   BMI 22.67 kg/m   Wt Readings from Last 3 Encounters:  01/28/17 158 lb (71.7 kg)  01/25/17 160 lb (72.6 kg)  08/13/16 180 lb 4 oz (81.8 kg)    Physical Exam  Constitutional: He appears well-developed and well-nourished. No distress.  Obviously thinner than previously  HENT:  Head: Normocephalic and atraumatic.  Mouth/Throat: Oropharynx is clear and moist. No oropharyngeal exudate.  Eyes: Pupils are equal, round, and  reactive to light. Conjunctivae are normal.  Neck: Normal range of motion. Neck supple. No thyromegaly present.  Cardiovascular: Normal rate, regular rhythm, normal heart sounds and intact distal pulses.   No murmur heard. Pulmonary/Chest: Effort normal and breath sounds normal. No respiratory distress. He has no wheezes. He has no rales.  Abdominal: Soft. Bowel sounds are normal. He exhibits no distension and no mass. There is no tenderness. There is no rebound and no guarding.  Musculoskeletal: He exhibits no edema.  Lymphadenopathy:    He has no cervical adenopathy.  Skin: Skin is warm and dry. No rash noted. No erythema.  Psychiatric: He has a normal mood and affect.  Nursing note and vitals reviewed.  Results for orders placed or performed during the hospital encounter of 01/25/17  CBC  Result Value Ref Range   WBC 8.6 3.8 - 10.6 K/uL   RBC 4.23 (L) 4.40 - 5.90 MIL/uL   Hemoglobin 13.0 13.0 - 18.0 g/dL   HCT 37.9 (L) 40.0 - 52.0 %   MCV 89.6 80.0 - 100.0 fL   MCH 30.6 26.0 - 34.0 pg   MCHC 34.2 32.0 - 36.0 g/dL   RDW 13.4 11.5 - 14.5 %   Platelets 135 (L) 150 - 440 K/uL  Basic metabolic panel  Result Value Ref Range   Sodium 135 135 - 145 mmol/L   Potassium 3.5 3.5 - 5.1 mmol/L   Chloride 103 101 - 111 mmol/L   CO2 23 22 - 32 mmol/L   Glucose, Bld 134 (H) 65 - 99 mg/dL   BUN 21 (H) 6 - 20 mg/dL   Creatinine, Ser 0.97 0.61 - 1.24 mg/dL   Calcium 8.8 (L) 8.9 - 10.3 mg/dL   GFR calc non Af Amer >60 >60 mL/min   GFR calc Af Amer >60 >60 mL/min   Anion gap 9 5 - 15  Urinalysis, Complete w Microscopic  Result Value Ref Range   Color, Urine YELLOW (A) YELLOW   APPearance CLEAR (A) CLEAR   Specific Gravity, Urine 1.005 1.005 - 1.030   pH 6.0 5.0 - 8.0   Glucose, UA NEGATIVE NEGATIVE mg/dL   Hgb urine dipstick NEGATIVE NEGATIVE   Bilirubin Urine NEGATIVE NEGATIVE   Ketones, ur NEGATIVE NEGATIVE mg/dL   Protein, ur NEGATIVE NEGATIVE mg/dL   Nitrite NEGATIVE NEGATIVE    Leukocytes, UA NEGATIVE NEGATIVE   RBC / HPF NONE SEEN 0 - 5 RBC/hpf   WBC, UA 0-5 0 - 5 WBC/hpf   Bacteria, UA RARE (A) NONE SEEN   Squamous Epithelial / LPF 0-5 (A) NONE SEEN      Assessment & Plan:   Problem List Items Addressed This Visit  Diabetes mellitus type 2, controlled, without complications ([place])    Reviewed latest A1c - improved control on metformin and with weight loss.       Rigors - Primary    Thought viral illness related. No further shaking episode since ER visit. Reviewed with patient.       Relevant Orders   Hepatic function panel   CBC with Differential/Platelet   TSH   Sedimentation rate   Scalp pruritus    rec tea tree oil shampoo, start using hat when out in sun (frequently)      Weight loss    Significant weight loss, pt attributes to healthy diet changes in an effort to control recently diagnosed diabetes. Advised to moderately liberalize diet, will reassess at CPE (in next 1-2 months) and if ongoing weight loss low threshold for imaging to eval other cause of weight loss. Check CBC with diff today as well as TSH, LFTs and ESR.       Relevant Orders   Hepatic function panel   CBC with Differential/Platelet   TSH   Sedimentation rate    Other Visit Diagnoses    Need for influenza vaccination       Relevant Orders   Flu Vaccine QUAD 6+ mos PF IM (Fluarix Quad PF) (Completed)       Follow up plan: Return in about 2 months (around 03/30/2017) for annual exam, prior fasting for blood work.  [name], MD

## 2017-05-27 IMAGING — CR DG HUMERUS 2V *L*
2 series · 2 of 2 positions shown · non-contrast
Comparison: None.

CLINICAL DATA: Left arm pain for 3 weeks.  No prior injury.

EXAM:
LEFT HUMERUS - 2+ VIEW

[view not recorded (1 of 2)]
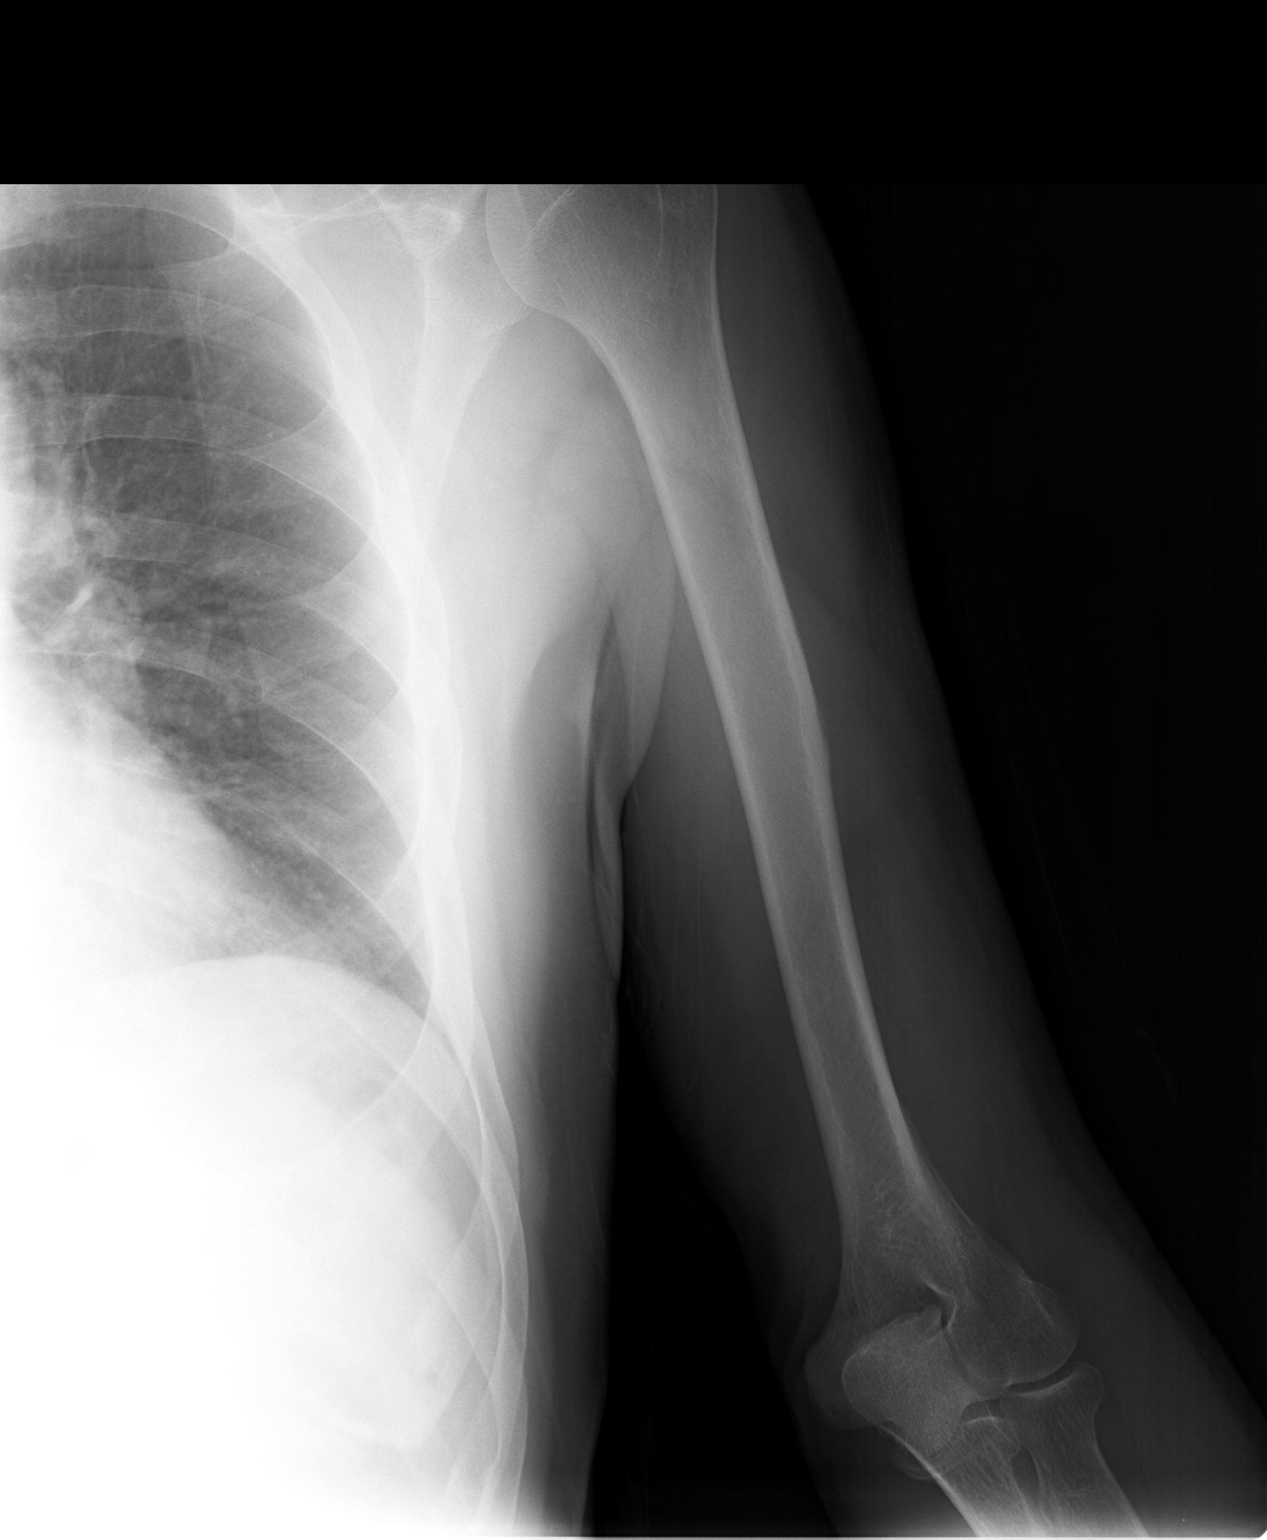

[view not recorded (2 of 2)]
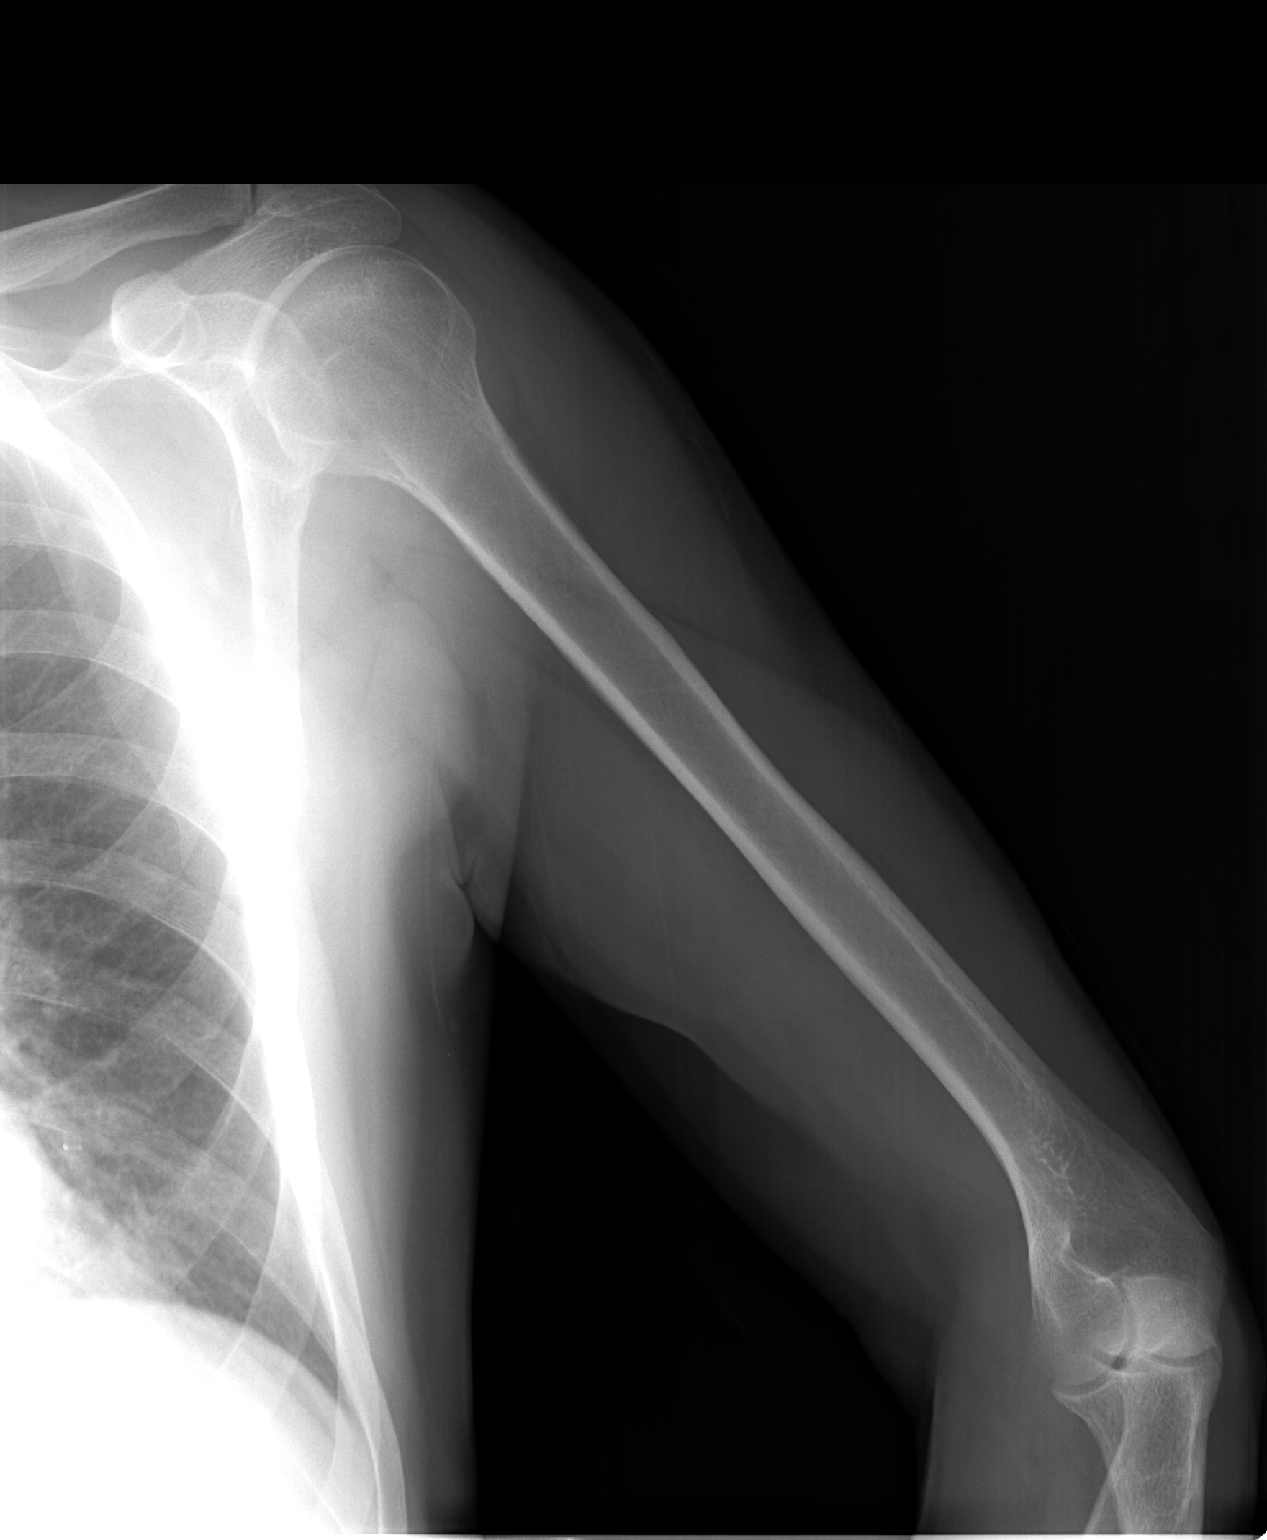

[2 of 2 positions shown; findings below may reference images not displayed]

FINDINGS: No acute bony or joint abnormality identified. No evidence of
fracture or dislocation.
IMPRESSION: No acute abnormality.

## 2017-05-27 IMAGING — CR DG TIBIA/FIBULA 2V*L*
2 series · 2 of 2 positions shown · non-contrast
Comparison: 01/17/2015.

CLINICAL DATA: Followup proximal fibular fracture.

EXAM:
LEFT TIBIA AND FIBULA - 2 VIEW

[view not recorded (1 of 2)]
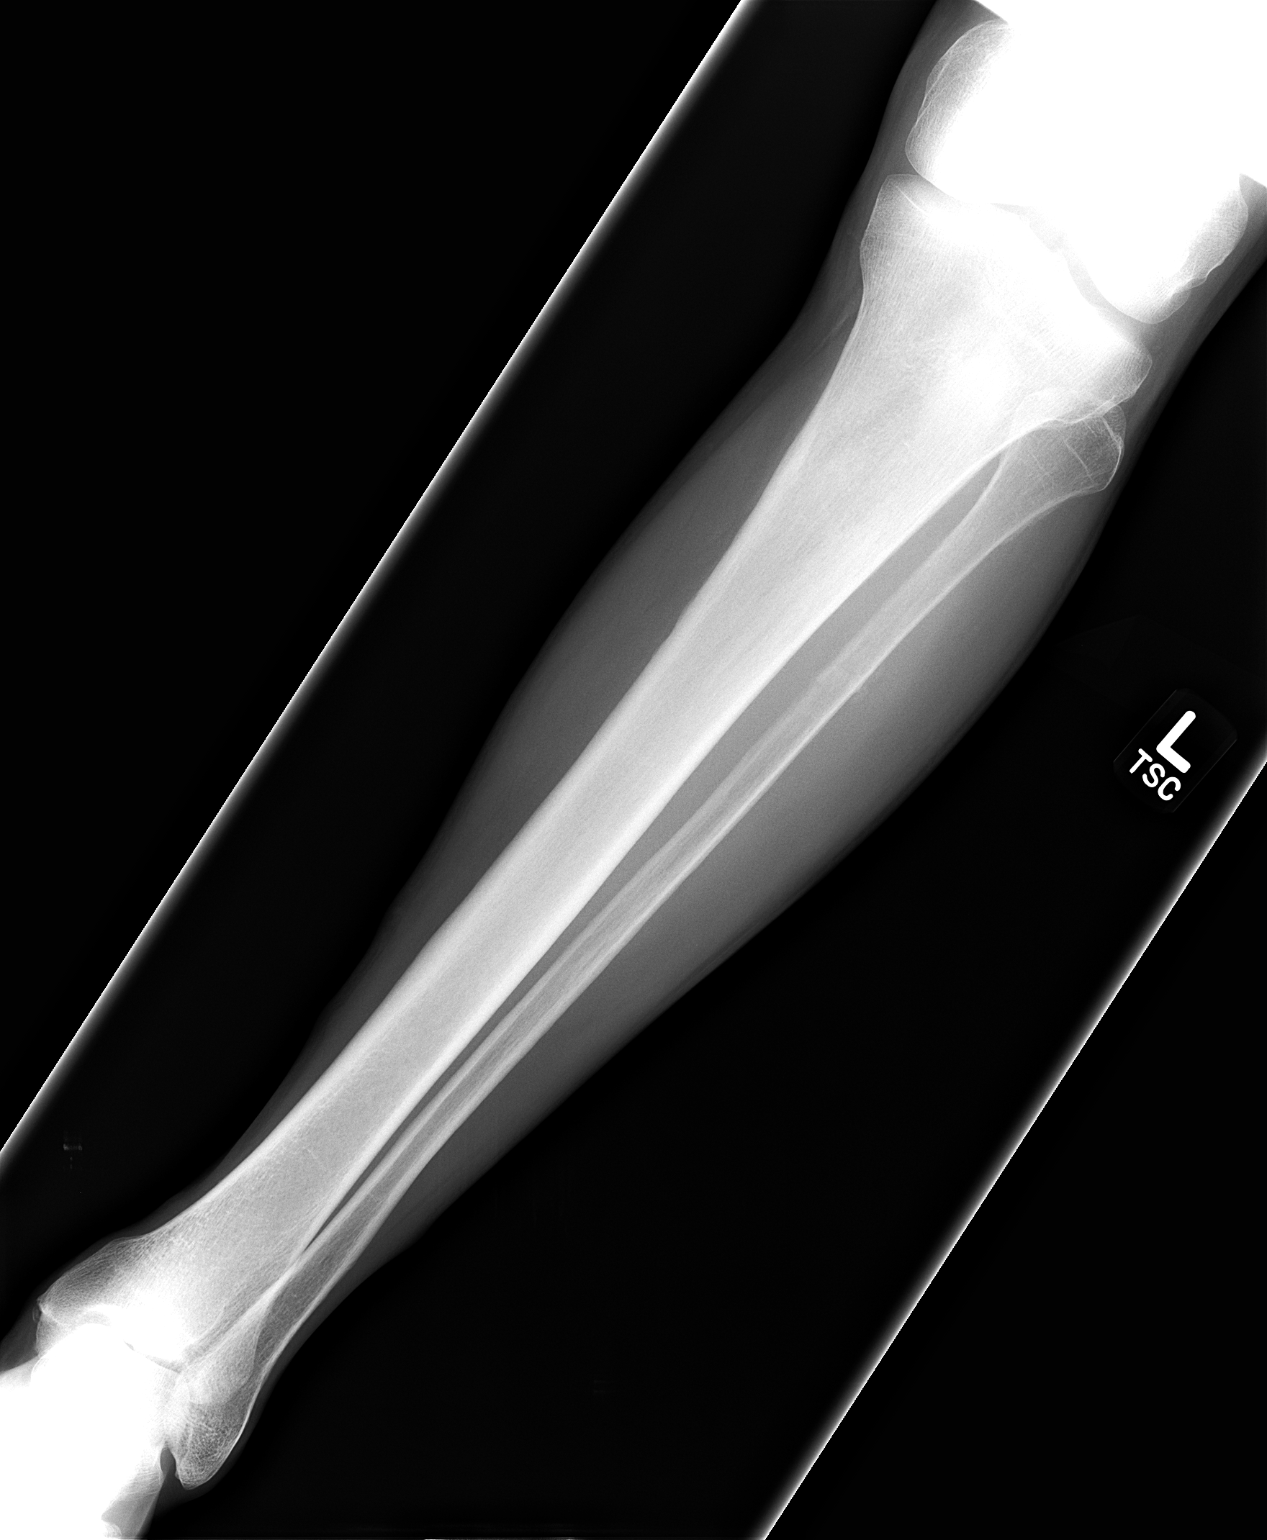

[view not recorded (2 of 2)]
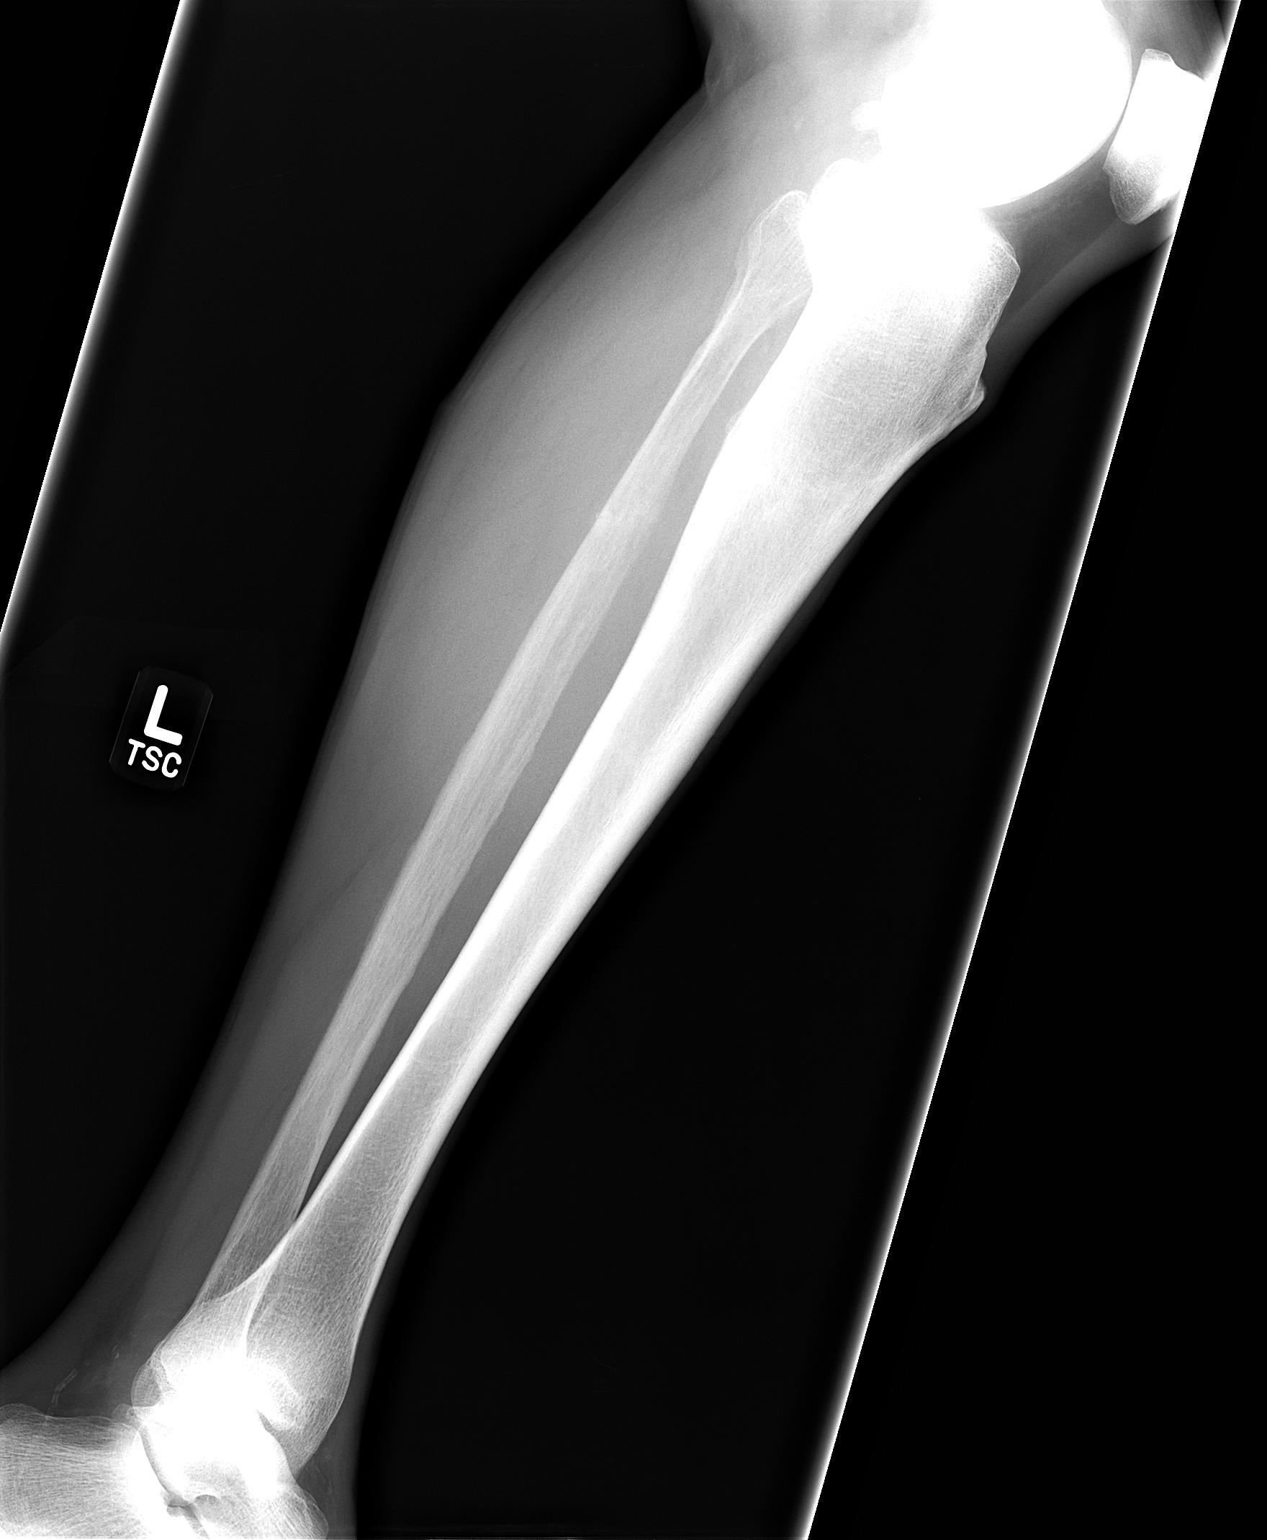

[2 of 2 positions shown; findings below may reference images not displayed]

FINDINGS: Interval mild callus formation at the location of the previously
seen nondisplaced proximal fibular shaft fracture. The fragments
remain nondisplaced and nonangulated.
IMPRESSION: Healing, nondisplaced proximal fibular shaft fracture.

## 2017-07-05 ENCOUNTER — Encounter: Payer: Self-pay | Admitting: Emergency Medicine

## 2017-07-05 ENCOUNTER — Other Ambulatory Visit: Payer: Self-pay

## 2017-07-05 DIAGNOSIS — E1151 Type 2 diabetes mellitus with diabetic peripheral angiopathy without gangrene: Secondary | ICD-10-CM | POA: Insufficient documentation

## 2017-07-05 DIAGNOSIS — Z86010 Personal history of colonic polyps: Secondary | ICD-10-CM | POA: Diagnosis not present

## 2017-07-05 DIAGNOSIS — I10 Essential (primary) hypertension: Secondary | ICD-10-CM | POA: Diagnosis not present

## 2017-07-05 DIAGNOSIS — Z720 Tobacco use: Secondary | ICD-10-CM | POA: Diagnosis not present

## 2017-07-05 DIAGNOSIS — Z79899 Other long term (current) drug therapy: Secondary | ICD-10-CM | POA: Diagnosis not present

## 2017-07-05 DIAGNOSIS — Z7984 Long term (current) use of oral hypoglycemic drugs: Secondary | ICD-10-CM | POA: Insufficient documentation

## 2017-07-05 DIAGNOSIS — M1990 Unspecified osteoarthritis, unspecified site: Secondary | ICD-10-CM | POA: Insufficient documentation

## 2017-07-05 DIAGNOSIS — E785 Hyperlipidemia, unspecified: Secondary | ICD-10-CM | POA: Diagnosis not present

## 2017-07-05 DIAGNOSIS — K810 Acute cholecystitis: Principal | ICD-10-CM | POA: Insufficient documentation

## 2017-07-05 DIAGNOSIS — Z888 Allergy status to other drugs, medicaments and biological substances status: Secondary | ICD-10-CM | POA: Diagnosis not present

## 2017-07-05 NOTE — ED Triage Notes (Signed)
Pt arrives ambulatory to triage with c/o abdominal upset since eating [employer] this evening. Pt denies N/V/D. Pt is in NAD.

## 2017-07-06 ENCOUNTER — Observation Stay
Admission: EM | Admit: 2017-07-06 | Discharge: 2017-07-07 | Disposition: A | Discharge status: Home / Self Care | Payer: BLUE CROSS/BLUE SHIELD | Attending: Surgery | Admitting: Surgery

## 2017-07-06 ENCOUNTER — Emergency Department: Payer: BLUE CROSS/BLUE SHIELD

## 2017-07-06 ENCOUNTER — Observation Stay: Payer: BLUE CROSS/BLUE SHIELD | Admitting: Certified Registered Nurse Anesthetist

## 2017-07-06 ENCOUNTER — Encounter: Payer: Self-pay | Admitting: Radiology

## 2017-07-06 ENCOUNTER — Encounter
Admission: EM | Disposition: A | Discharge status: Home / Self Care | Payer: Self-pay | Source: Home / Self Care | Attending: Emergency Medicine

## 2017-07-06 DIAGNOSIS — K810 Acute cholecystitis: Secondary | ICD-10-CM | POA: Diagnosis not present

## 2017-07-06 HISTORY — DX: Acute cholecystitis: K81.0

## 2017-07-06 HISTORY — PX: CHOLECYSTECTOMY: SHX55

## 2017-07-06 LAB — URINALYSIS, COMPLETE (UACMP) WITH MICROSCOPIC
BACTERIA UA: NONE SEEN
BILIRUBIN URINE: NEGATIVE
Glucose, UA: NEGATIVE mg/dL
KETONES UR: NEGATIVE mg/dL
LEUKOCYTES UA: NEGATIVE
Nitrite: NEGATIVE
PROTEIN: NEGATIVE mg/dL
SQUAMOUS EPITHELIAL / LPF: NONE SEEN
Specific Gravity, Urine: 1.017 (ref 1.005–1.030)
pH: 5 (ref 5.0–8.0)

## 2017-07-06 LAB — LIPASE, BLOOD: Lipase: 31 U/L (ref 11–51)

## 2017-07-06 LAB — CBC WITH DIFFERENTIAL/PLATELET
Basophils Absolute: 0.1 10*3/uL (ref 0–0.1)
Basophils Relative: 1 %
EOS ABS: 0.3 10*3/uL (ref 0–0.7)
EOS PCT: 3 %
HCT: 41.2 % (ref 40.0–52.0)
HEMOGLOBIN: 13.8 g/dL (ref 13.0–18.0)
LYMPHS ABS: 1.4 10*3/uL (ref 1.0–3.6)
LYMPHS PCT: 11 %
MCH: 30.3 pg (ref 26.0–34.0)
MCHC: 33.4 g/dL (ref 32.0–36.0)
MCV: 90.5 fL (ref 80.0–100.0)
Monocytes Absolute: 0.8 10*3/uL (ref 0.2–1.0)
Monocytes Relative: 7 %
Neutro Abs: 9.7 10*3/uL — ABNORMAL HIGH (ref 1.4–6.5)
Neutrophils Relative %: 78 %
PLATELETS: 181 10*3/uL (ref 150–440)
RBC: 4.55 MIL/uL (ref 4.40–5.90)
RDW: 13 % (ref 11.5–14.5)
WBC: 12.3 10*3/uL — AB (ref 3.8–10.6)

## 2017-07-06 LAB — BASIC METABOLIC PANEL
ANION GAP: 10 (ref 5–15)
BUN: 20 mg/dL (ref 6–20)
CO2: 24 mmol/L (ref 22–32)
Calcium: 9.1 mg/dL (ref 8.9–10.3)
Chloride: 100 mmol/L — ABNORMAL LOW (ref 101–111)
Creatinine, Ser: 1.04 mg/dL (ref 0.61–1.24)
GFR calc Af Amer: >60 mL/min (ref >60)
GFR calc non Af Amer: >60 mL/min (ref >60)
GLUCOSE: 169 mg/dL — AB (ref 65–99)
POTASSIUM: 3.6 mmol/L (ref 3.5–5.1)
Sodium: 134 mmol/L — ABNORMAL LOW (ref 135–145)

## 2017-07-06 LAB — HEPATIC FUNCTION PANEL
ALT: 23 U/L (ref 17–63)
AST: 22 U/L (ref 15–41)
Albumin: 4 g/dL (ref 3.5–5.0)
Alkaline Phosphatase: 68 U/L (ref 38–126)
BILIRUBIN TOTAL: 0.7 mg/dL (ref 0.3–1.2)
Bilirubin, Direct: <0.1 mg/dL — ABNORMAL LOW (ref 0.1–0.5)
Total Protein: 7.1 g/dL (ref 6.5–8.1)

## 2017-07-06 LAB — GLUCOSE, CAPILLARY
GLUCOSE-CAPILLARY: 182 mg/dL — AB (ref 65–99)
Glucose-Capillary: 181 mg/dL — ABNORMAL HIGH (ref 65–99)

## 2017-07-06 LAB — LACTIC ACID, PLASMA
LACTIC ACID, VENOUS: 2.8 mmol/L — AB (ref 0.5–1.9)
Lactic Acid, Venous: 1.9 mmol/L (ref 0.5–1.9)

## 2017-07-06 LAB — TROPONIN I: Troponin I: <0.03 ng/mL (ref <0.03)

## 2017-07-06 SURGERY — LAPAROSCOPIC CHOLECYSTECTOMY
Anesthesia: General | Site: Abdomen | Wound class: Clean Contaminated

## 2017-07-06 MED ORDER — SODIUM CHLORIDE 0.9 % IV BOLUS (SEPSIS)
1000.0000 mL | INTRAVENOUS | Status: AC
  Administered 2017-07-06: 1000 mL via INTRAVENOUS

## 2017-07-06 MED ORDER — ONDANSETRON 4 MG PO TBDP
4.0000 mg | ORAL_TABLET | Freq: Once | ORAL | Status: AC
  Administered 2017-07-06: 4 mg via ORAL
  Filled 2017-07-06: qty 1

## 2017-07-06 MED ORDER — IOPAMIDOL (ISOVUE-300) INJECTION 61%
100.0000 mL | Freq: Once | INTRAVENOUS | Status: AC | PRN
  Administered 2017-07-06: 100 mL via INTRAVENOUS

## 2017-07-06 MED ORDER — OXYCODONE HCL 5 MG PO TABS
5.0000 mg | ORAL_TABLET | ORAL | Status: DC | PRN
  Filled 2017-07-06: qty 1

## 2017-07-06 MED ORDER — FENTANYL CITRATE (PF) 100 MCG/2ML IJ SOLN
INTRAMUSCULAR | Status: DC | PRN
  Administered 2017-07-06: 100 ug via INTRAVENOUS

## 2017-07-06 MED ORDER — MORPHINE SULFATE (PF) 2 MG/ML IV SOLN: 2.0000 mg | INTRAVENOUS | Status: DC | PRN

## 2017-07-06 MED ORDER — KETOROLAC TROMETHAMINE 30 MG/ML IJ SOLN
30.0000 mg | Freq: Four times a day (QID) | INTRAMUSCULAR | Status: DC
  Administered 2017-07-06 – 2017-07-07 (×3): 30 mg via INTRAVENOUS
  Filled 2017-07-06 (×9): qty 1

## 2017-07-06 MED ORDER — LACTATED RINGERS IV SOLN
INTRAVENOUS | Status: DC
  Administered 2017-07-06 – 2017-07-07 (×3): via INTRAVENOUS

## 2017-07-06 MED ORDER — SODIUM CHLORIDE 0.9 % IV BOLUS (SEPSIS)
1000.0000 mL | Freq: Once | INTRAVENOUS | Status: AC
  Administered 2017-07-06: 1000 mL via INTRAVENOUS

## 2017-07-06 MED ORDER — MORPHINE SULFATE (PF) 4 MG/ML IV SOLN
4.0000 mg | Freq: Once | INTRAVENOUS | Status: AC
  Administered 2017-07-06: 4 mg via INTRAVENOUS
  Filled 2017-07-06: qty 1

## 2017-07-06 MED ORDER — ROCURONIUM BROMIDE 50 MG/5ML IV SOLN
INTRAVENOUS | Status: AC
  Filled 2017-07-06: qty 1

## 2017-07-06 MED ORDER — HYDRALAZINE HCL 20 MG/ML IJ SOLN: 10.0000 mg | INTRAMUSCULAR | Status: DC | PRN

## 2017-07-06 MED ORDER — ROCURONIUM BROMIDE 100 MG/10ML IV SOLN
INTRAVENOUS | Status: DC | PRN
  Administered 2017-07-06: 15 mg via INTRAVENOUS
  Administered 2017-07-06: 45 mg via INTRAVENOUS

## 2017-07-06 MED ORDER — ONDANSETRON HCL 4 MG/2ML IJ SOLN
4.0000 mg | Freq: Once | INTRAMUSCULAR | Status: AC
  Administered 2017-07-06: 4 mg via INTRAVENOUS
  Filled 2017-07-06: qty 2

## 2017-07-06 MED ORDER — LIDOCAINE HCL (PF) 2 % IJ SOLN
INTRAMUSCULAR | Status: AC
  Filled 2017-07-06: qty 10

## 2017-07-06 MED ORDER — BUPIVACAINE-EPINEPHRINE 0.25% -1:200000 IJ SOLN
INTRAMUSCULAR | Status: DC | PRN
  Administered 2017-07-06: 30 mL

## 2017-07-06 MED ORDER — PANTOPRAZOLE SODIUM 40 MG IV SOLR
40.0000 mg | Freq: Two times a day (BID) | INTRAVENOUS | Status: DC
  Administered 2017-07-06 – 2017-07-07 (×3): 40 mg via INTRAVENOUS
  Filled 2017-07-06 (×3): qty 40

## 2017-07-06 MED ORDER — FENTANYL CITRATE (PF) 100 MCG/2ML IJ SOLN
25.0000 ug | INTRAMUSCULAR | Status: DC | PRN
  Administered 2017-07-06 (×4): 25 ug via INTRAVENOUS

## 2017-07-06 MED ORDER — PROPOFOL 10 MG/ML IV BOLUS
INTRAVENOUS | Status: AC
  Filled 2017-07-06: qty 20

## 2017-07-06 MED ORDER — SUCCINYLCHOLINE CHLORIDE 20 MG/ML IJ SOLN
INTRAMUSCULAR | Status: AC
  Filled 2017-07-06: qty 1

## 2017-07-06 MED ORDER — KETOROLAC TROMETHAMINE 30 MG/ML IJ SOLN: 30.0000 mg | Freq: Four times a day (QID) | INTRAMUSCULAR | Status: DC | PRN

## 2017-07-06 MED ORDER — PIPERACILLIN-TAZOBACTAM 3.375 G IVPB 30 MIN
3.3750 g | Freq: Once | INTRAVENOUS | Status: AC
  Administered 2017-07-06: 3.375 g via INTRAVENOUS
  Filled 2017-07-06: qty 50

## 2017-07-06 MED ORDER — ONDANSETRON 4 MG PO TBDP: 4.0000 mg | ORAL_TABLET | Freq: Four times a day (QID) | ORAL | Status: DC | PRN

## 2017-07-06 MED ORDER — SUGAMMADEX SODIUM 200 MG/2ML IV SOLN
INTRAVENOUS | Status: DC | PRN
  Administered 2017-07-06: 175 mg via INTRAVENOUS

## 2017-07-06 MED ORDER — OXYCODONE-ACETAMINOPHEN 5-325 MG PO TABS
1.0000 | ORAL_TABLET | Freq: Once | ORAL | Status: AC
  Administered 2017-07-06: 1 via ORAL
  Filled 2017-07-06: qty 1

## 2017-07-06 MED ORDER — INSULIN ASPART 100 UNIT/ML ~~LOC~~ SOLN
0.0000 [IU] | Freq: Three times a day (TID) | SUBCUTANEOUS | Status: DC
  Administered 2017-07-07: 2 [IU] via SUBCUTANEOUS
  Filled 2017-07-06: qty 1

## 2017-07-06 MED ORDER — DEXAMETHASONE SODIUM PHOSPHATE 10 MG/ML IJ SOLN
INTRAMUSCULAR | Status: AC
  Filled 2017-07-06: qty 1

## 2017-07-06 MED ORDER — FENTANYL CITRATE (PF) 100 MCG/2ML IJ SOLN
INTRAMUSCULAR | Status: AC
Start: 2017-07-06 — End: ?
  Filled 2017-07-06: qty 2

## 2017-07-06 MED ORDER — LIDOCAINE HCL (CARDIAC) 20 MG/ML IV SOLN
INTRAVENOUS | Status: DC | PRN
  Administered 2017-07-06: 100 mg via INTRAVENOUS

## 2017-07-06 MED ORDER — PIPERACILLIN-TAZOBACTAM 3.375 G IVPB
3.3750 g | Freq: Three times a day (TID) | INTRAVENOUS | Status: DC
  Administered 2017-07-06 – 2017-07-07 (×3): 3.375 g via INTRAVENOUS
  Filled 2017-07-06 (×3): qty 50

## 2017-07-06 MED ORDER — HEPARIN SODIUM (PORCINE) 5000 UNIT/ML IJ SOLN
5000.0000 [IU] | Freq: Three times a day (TID) | INTRAMUSCULAR | Status: DC
  Administered 2017-07-06 – 2017-07-07 (×3): 5000 [IU] via SUBCUTANEOUS
  Filled 2017-07-06 (×3): qty 1

## 2017-07-06 MED ORDER — ONDANSETRON HCL 4 MG/2ML IJ SOLN
INTRAMUSCULAR | Status: AC
  Filled 2017-07-06: qty 2

## 2017-07-06 MED ORDER — INSULIN ASPART 100 UNIT/ML ~~LOC~~ SOLN: 0.0000 [IU] | Freq: Every day | SUBCUTANEOUS | Status: DC

## 2017-07-06 MED ORDER — ONDANSETRON HCL 4 MG/2ML IJ SOLN: 4.0000 mg | Freq: Once | INTRAMUSCULAR | Status: DC | PRN

## 2017-07-06 MED ORDER — SUGAMMADEX SODIUM 200 MG/2ML IV SOLN
INTRAVENOUS | Status: AC
  Filled 2017-07-06: qty 2

## 2017-07-06 MED ORDER — ACETAMINOPHEN 10 MG/ML IV SOLN
INTRAVENOUS | Status: DC | PRN
  Administered 2017-07-06: 1000 mg via INTRAVENOUS

## 2017-07-06 MED ORDER — DEXAMETHASONE SODIUM PHOSPHATE 10 MG/ML IJ SOLN
INTRAMUSCULAR | Status: DC | PRN
  Administered 2017-07-06: 10 mg via INTRAVENOUS

## 2017-07-06 MED ORDER — INSULIN ASPART 100 UNIT/ML ~~LOC~~ SOLN
4.0000 [IU] | Freq: Three times a day (TID) | SUBCUTANEOUS | Status: DC
  Filled 2017-07-06: qty 1

## 2017-07-06 MED ORDER — KETOROLAC TROMETHAMINE 30 MG/ML IJ SOLN
INTRAMUSCULAR | Status: AC
  Filled 2017-07-06: qty 1

## 2017-07-06 MED ORDER — ACETAMINOPHEN 10 MG/ML IV SOLN
INTRAVENOUS | Status: AC
  Filled 2017-07-06: qty 100

## 2017-07-06 MED ORDER — ONDANSETRON HCL 4 MG/2ML IJ SOLN: 4.0000 mg | Freq: Four times a day (QID) | INTRAMUSCULAR | Status: DC | PRN

## 2017-07-06 MED ORDER — RACEPINEPHRINE HCL 2.25 % IN NEBU
INHALATION_SOLUTION | RESPIRATORY_TRACT | Status: AC
  Filled 2017-07-06: qty 0.5

## 2017-07-06 MED ORDER — ACETAMINOPHEN 500 MG PO TABS
1000.0000 mg | ORAL_TABLET | Freq: Four times a day (QID) | ORAL | Status: DC
  Administered 2017-07-06 – 2017-07-07 (×3): 1000 mg via ORAL
  Filled 2017-07-06 (×3): qty 2

## 2017-07-06 MED ORDER — MIDAZOLAM HCL 2 MG/2ML IJ SOLN
INTRAMUSCULAR | Status: AC
  Filled 2017-07-06: qty 2

## 2017-07-06 MED ORDER — PROPOFOL 10 MG/ML IV BOLUS
INTRAVENOUS | Status: DC | PRN
  Administered 2017-07-06: 150 mg via INTRAVENOUS

## 2017-07-06 MED ORDER — MIDAZOLAM HCL 2 MG/2ML IJ SOLN
INTRAMUSCULAR | Status: DC | PRN
  Administered 2017-07-06: 2 mg via INTRAVENOUS

## 2017-07-06 MED ORDER — ONDANSETRON HCL 4 MG/2ML IJ SOLN
INTRAMUSCULAR | Status: DC | PRN
  Administered 2017-07-06: 4 mg via INTRAVENOUS

## 2017-07-06 MED ORDER — FENTANYL CITRATE (PF) 100 MCG/2ML IJ SOLN
INTRAMUSCULAR | Status: AC
  Filled 2017-07-06: qty 2

## 2017-07-06 MED ORDER — SEVOFLURANE IN SOLN
RESPIRATORY_TRACT | Status: AC
  Filled 2017-07-06: qty 250

## 2017-07-06 MED ORDER — BUPIVACAINE-EPINEPHRINE (PF) 0.25% -1:200000 IJ SOLN
INTRAMUSCULAR | Status: AC
  Filled 2017-07-06: qty 30

## 2017-07-06 SURGICAL SUPPLY — 45 items
APPLICATOR COTTON TIP 6IN STRL (MISCELLANEOUS) ×2 IMPLANT
APPLIER CLIP 5 13 M/L LIGAMAX5 (MISCELLANEOUS) ×2
BLADE SURG 15 STRL LF DISP TIS (BLADE) ×1 IMPLANT
BLADE SURG 15 STRL SS (BLADE) ×1
CANISTER SUCT 1200ML W/VALVE (MISCELLANEOUS) ×2 IMPLANT
CHLORAPREP W/TINT 26ML (MISCELLANEOUS) ×2 IMPLANT
CHOLANGIOGRAM CATH TAUT (CATHETERS) IMPLANT
CLEANER CAUTERY TIP 5X5 PAD (MISCELLANEOUS) ×1 IMPLANT
CLIP APPLIE 5 13 M/L LIGAMAX5 (MISCELLANEOUS) ×1 IMPLANT
DECANTER SPIKE VIAL GLASS SM (MISCELLANEOUS) IMPLANT
DERMABOND ADVANCED (GAUZE/BANDAGES/DRESSINGS) ×1
DERMABOND ADVANCED .7 DNX12 (GAUZE/BANDAGES/DRESSINGS) ×1 IMPLANT
DRAPE C-ARM XRAY 36X54 (DRAPES) IMPLANT
ELECT CAUTERY BLADE 6.4 (BLADE) ×2 IMPLANT
ELECT REM PT RETURN 9FT ADLT (ELECTROSURGICAL) ×2
ELECTRODE REM PT RTRN 9FT ADLT (ELECTROSURGICAL) ×1 IMPLANT
GLOVE BIO SURGEON STRL SZ7 (GLOVE) ×8 IMPLANT
GOWN STRL REUS W/ TWL LRG LVL3 (GOWN DISPOSABLE) ×2 IMPLANT
GOWN STRL REUS W/TWL LRG LVL3 (GOWN DISPOSABLE) ×2
IRRIGATION STRYKERFLOW (MISCELLANEOUS) ×1 IMPLANT
IRRIGATOR STRYKERFLOW (MISCELLANEOUS) ×2
IV CATH ANGIO 12GX3 LT BLUE (NEEDLE) ×2 IMPLANT
IV NS 1000ML (IV SOLUTION) ×1
IV NS 1000ML BAXH (IV SOLUTION) ×1 IMPLANT
L-HOOK LAP DISP 36CM (ELECTROSURGICAL) ×2
LHOOK LAP DISP 36CM (ELECTROSURGICAL) ×1 IMPLANT
NEEDLE HYPO 22GX1.5 SAFETY (NEEDLE) ×2 IMPLANT
PACK LAP CHOLECYSTECTOMY (MISCELLANEOUS) ×2 IMPLANT
PAD CLEANER CAUTERY TIP 5X5 (MISCELLANEOUS) ×1
PENCIL ELECTRO HAND CTR (MISCELLANEOUS) ×2 IMPLANT
POUCH SPECIMEN RETRIEVAL 10MM (ENDOMECHANICALS) ×2 IMPLANT
SCISSORS METZENBAUM CVD 33 (INSTRUMENTS) IMPLANT
SLEEVE ENDOPATH XCEL 5M (ENDOMECHANICALS) ×4 IMPLANT
SOL ANTI-FOG 6CC FOG-OUT (MISCELLANEOUS) ×1 IMPLANT
SOL FOG-OUT ANTI-FOG 6CC (MISCELLANEOUS) ×1
SPONGE LAP 18X18 5 PK (GAUZE/BANDAGES/DRESSINGS) ×2 IMPLANT
STOPCOCK 4 WAY LG BORE MALE ST (IV SETS) IMPLANT
SUT ETHIBOND 0 MO6 C/R (SUTURE) IMPLANT
SUT MNCRL AB 4-0 PS2 18 (SUTURE) ×2 IMPLANT
SUT VICRYL 0 AB UR-6 (SUTURE) ×4 IMPLANT
SYR 20CC LL (SYRINGE) ×2 IMPLANT
TROCAR XCEL BLUNT TIP 100MML (ENDOMECHANICALS) ×2 IMPLANT
TROCAR XCEL NON-BLD 5MMX100MML (ENDOMECHANICALS) ×2 IMPLANT
TUBING INSUFFLATION (TUBING) ×2 IMPLANT
WATER STERILE IRR 1000ML POUR (IV SOLUTION) IMPLANT

## 2017-07-06 NOTE — Transfer of Care (Signed)
Immediate Anesthesia Transfer of Care Note  Patient: Mark Davidson  Procedure(s) Performed: LAPAROSCOPIC CHOLECYSTECTOMY (N/A )  Patient Location: PACU  Anesthesia Type:General  Level of Consciousness: awake and alert   Airway & Oxygen Therapy: Patient Spontanous Breathing  Post-op Assessment: Report given to RN and Post -op Vital signs reviewed and stable  Post vital signs: Reviewed and stable  Last Vitals:  Vitals:   07/06/17 1139 07/06/17 1559  BP: (!) 144/64 (!) 161/66  Pulse: 87 (!) 108  Resp: 16 12  Temp: 37.4 C 37.8 C  SpO2: 96% 95%    Last Pain:  Vitals:   07/06/17 1139  TempSrc: Oral  PainSc: 3          Complications: No apparent anesthesia complications

## 2017-07-06 NOTE — ED Notes (Signed)
Per MD, lactic acid not to be drawn until NaCl completed

## 2017-07-06 NOTE — Anesthesia Procedure Notes (Signed)
Procedure Name: Intubation Date/Time: 07/06/2017 2:55 PM Performed by: Johnna Acosta, CRNA Pre-anesthesia Checklist: Patient identified, Emergency Drugs available, Suction available, Patient being monitored and Timeout performed Patient Re-evaluated:Patient Re-evaluated prior to induction Oxygen Delivery Method: Circle system utilized Preoxygenation: Pre-oxygenation with 100% oxygen Induction Type: IV induction Ventilation: Mask ventilation with difficulty and Oral airway inserted - appropriate to patient size Laryngoscope Size: Sabra Heck, 2, McGraph and 3 Grade View: Grade III Tube type: Oral Tube size: 7.0 mm Number of attempts: 3 Airway Equipment and Method: Stylet,  Video-laryngoscopy,  Bougie stylet and Oral airway Placement Confirmation: ETT inserted through vocal cords under direct vision,  positive ETCO2 and breath sounds checked- equal and bilateral Secured at: 22 cm Tube secured with: Tape Dental Injury: Teeth and Oropharynx as per pre-operative assessment and Injury to lip  Difficulty Due To: Difficulty was anticipated, Difficult Airway- due to reduced neck mobility and Difficult Airway- due to anterior larynx Future Recommendations: Recommend- induction with short-acting agent, and alternative techniques readily available

## 2017-07-06 NOTE — ED Notes (Signed)
Verbal report given to Eliezer Lofts, RN and Dr Karma Greaser

## 2017-07-06 NOTE — Anesthesia Post-op Follow-up Note (Signed)
Anesthesia QCDR form completed.        

## 2017-07-06 NOTE — H&P (Signed)
Patient ID: Mark Davidson, male   DOB: 26-Sep-1950, 67 y.o.   MRN: [id number]  HPI Mark Davidson is a 67 y.o. male center to the ER complaining of moderate to severe abdominal pain that started last night after supper.  He had some gravy steak and fries.  Pain is in the epigastric area and right upper quadrant with some radiation to the back.  Pain is sharp moderate to severe in intensity and worsening when he takes a deep breath. Did develop some nausea and some fevers.  Her episode 6 months ago but no images were done other than the chest x-ray and EKG.  Is diabetic but is otherwise healthy.  Is able to perform more than 4 metastases of activity without any shortness of breath or chest pain. On arrival his lactic level was elevated as well as his white count.  LFTs are normal.  CT scan and ultrasound have been personally reviewed there is evidence of thickening of the gallbladder with pericholecystic fluid.  No stones seen on ultrasound however on CT I may see some sludge.  Normal common bile duct  HPI  Past Medical History:  Diagnosis Date  . Arthritis   . Carotid stenosis   . Colon polyp   . Diabetes mellitus type 2, uncontrolled, without complications ([place])   . ED (erectile dysfunction)   . Hyperlipemia   . Hypertension     Past Surgical History:  Procedure Laterality Date  . CARDIAC CATHETERIZATION  2009   normal per pt  . carotid US  03/2012   bilateral 40-59% stenosis, rec rpt 1 yr  . TONSILLECTOMY AND ADENOIDECTOMY      Family History  Problem Relation Age of Onset  . Rheum arthritis Father   . Hyperlipidemia Father   . Heart disease Father   . Stroke Father   . Diabetes Father   . Arthritis Father   . Coronary artery disease Father   . Breast cancer Mother   . Cancer Mother        breast  . Heart disease Brother   . Coronary artery disease Brother   . Hyperlipidemia Brother   . Diabetes Brother     Social History Social History   Tobacco Use  .  Smoking status: Never Smoker  . Smokeless tobacco: Current User    Types: Snuff  Substance Use Topics  . Alcohol use: No  . Drug use: No    Allergies  Allergen Reactions  . Ace Inhibitors Cough    Current Facility-Administered Medications  Medication Dose Route Frequency Provider Last Rate Last Dose  . piperacillin-tazobactam (ZOSYN) IVPB 3.375 g  3.375 g Intravenous Once Hinda Kehr, MD 100 mL/hr at 07/06/17 0848 3.375 g at 07/06/17 0848   Current Outpatient Medications  Medication Sig Dispense Refill  . atorvastatin (LIPITOR) 40 MG tablet Take 1 tablet (40 mg total) by mouth daily. 90 tablet 3  . diphenhydramine-acetaminophen (TYLENOL PM) 25-500 MG TABS Take 1 tablet by mouth at bedtime.     Marland Kitchen losartan-hydrochlorothiazide (HYZAAR) 50-12.5 MG tablet Take 1 tablet by mouth daily. 90 tablet 3  . metFORMIN (GLUCOPHAGE) 500 MG tablet Take 1 tablet (500 mg total) by mouth daily with breakfast. 90 tablet 3  . omeprazole (PRILOSEC) 20 MG capsule Take 1 capsule (20 mg total) by mouth daily. 90 capsule 3     Review of Systems Full ROS  was asked and was negative except for the information on the HPI  Physical Exam Blood pressure Marland Kitchen)  153/66, pulse 83, temperature 98.6 F (37 C), temperature source Oral, resp. rate 11, height 5\' 10"  (1.778 m), weight 78.5 kg (173 lb), SpO2 99 %. CONSTITUTIONAL: NAD EYES: Pupils are equal, round, and reactive to light, Sclera are non-icteric. EARS, NOSE, MOUTH AND THROAT: The oropharynx is clear. The oral mucosa is pink and moist. Hearing is intact to voice. LYMPH NODES:  Lymph nodes in the neck are normal. RESPIRATORY:  Lungs are clear. There is normal respiratory effort, with equal breath sounds bilaterally, and without pathologic use of accessory muscles. CARDIOVASCULAR: Heart is regular without murmurs, gallops, or rubs. GI: The abdomen is  soft, tender to palpation in the right upper quadrant and epigastric area with positive Murphy sign.  No  peritonitis.  No masses  + BS GU: Rectal deferred.   MUSCULOSKELETAL: Normal muscle strength and tone. No cyanosis or edema.   SKIN: Turgor is good and there are no pathologic skin lesions or ulcers. NEUROLOGIC: Motor and sensation is grossly normal. Cranial nerves are grossly intact. PSYCH:  Oriented to person, place and time. Affect is normal.  Data Reviewed  I have personally reviewed the patient's imaging, laboratory findings and medical records.    Assessment/ Plan 67 year old male with classic signs and symptoms consistent with cholecystitis.  No evidence of stones on ultrasound but on CT I may see some sludge.  In any circumstances his clinical picture is highly suspicious for acute cholecystitis and as such should be treated.  Gust with the patient about admission to the hospital for aggressive fluid resuscitation and IV antibiotic and a prompt laparoscopic cholecystectomy.  We will talk to the OR and schedule a laparoscopic cholecystectomy hopefully for today and if we are unable to do so Dr. Rosana Hoes will do the tomorrow. The risks, benefits, complications, treatment options, and expected outcomes were discussed with the patient. The possibilities of bleeding, recurrent infection, finding a normal gallbladder, perforation of viscus organs, damage to surrounding structures, bile leak, abscess formation, needing a drain placed, the need for additional procedures, reaction to medication, pulmonary aspiration,  failure to diagnose a condition, the possible need to convert to an open procedure, and creating a complication requiring transfusion or operation were discussed with the patient. The patient and/or family concurred with the proposed plan, giving informed consent.    Caroleen Hamman, MD FACS General Surgeon 07/06/2017, 9:10 AM

## 2017-07-06 NOTE — ED Provider Notes (Addendum)
[facility] Emergency Department Provider Note  ____________________________________________   First MD Initiated Contact with Patient 07/06/17 [phone]     (approximate)  I have reviewed the triage vital signs and the nursing notes.   HISTORY  Chief Complaint Abdominal Cramping    HPI Mark Davidson is a 67 y.o. male with medical history as listed below who presents for evaluation of acute onset abdominal pain that occurred 45 minutes to an hour after eating dinner at a restaurant.  He has had more or less constant pain since that time, which was hours ago.  Nothing in particular makes it better or worse.  It was severe at its worst but he feels much better after getting some pain medicine while he was awaiting an exam room.  He had some nausea and only one episode of vomiting.  He has had no abnormal bowel movements including no diarrhea and no recent constipation.  The onset was acute.  He denies fever/chills, chest pain, shortness of breath, and dysuria.  He has no history of abdominal surgeries.  Past Medical History:  Diagnosis Date  . Arthritis   . Carotid stenosis   . Colon polyp   . Diabetes mellitus type 2, uncontrolled, without complications ([place])   . ED (erectile dysfunction)   . Hyperlipemia   . Hypertension     Patient Active Problem List   Diagnosis Date Noted  . Weight loss 01/28/2017  . Rigors 01/28/2017  . Diabetes mellitus type 2, controlled, without complications ([place])   . Health maintenance examination 03/14/2015  . Sinus congestion 03/14/2015  . Hematuria 03/14/2015  . Smokeless tobacco use 03/14/2015  . Left fibular fracture 01/17/2015  . Scalp pruritus 01/17/2015  . Bilateral carotid artery stenosis 03/23/2012  . Xerosis of skin 06/28/2011  . ED (erectile dysfunction)   . Vision problem 01/08/2011  . Constipation 01/08/2011  . Hypertension   . Hyperlipemia   . Colon polyp     Past Surgical History:  Procedure  Laterality Date  . CARDIAC CATHETERIZATION  2009   normal per pt  . carotid US  03/2012   bilateral 40-59% stenosis, rec rpt 1 yr  . TONSILLECTOMY AND ADENOIDECTOMY      Prior to Admission medications   Medication Sig Start Date End Date Taking? Authorizing Provider  atorvastatin (LIPITOR) 40 MG tablet Take 1 tablet (40 mg total) by mouth daily. 07/12/16   [name], MD  diphenhydramine-acetaminophen (TYLENOL PM) 25-500 MG TABS Take 1 tablet by mouth at bedtime as needed (sleep).     [provider]  losartan-hydrochlorothiazide (HYZAAR) 50-12.5 MG tablet Take 1 tablet by mouth daily. 07/12/16   [name], MD  metFORMIN (GLUCOPHAGE) 500 MG tablet Take 1 tablet (500 mg total) by mouth daily with breakfast. 08/20/16   [name], MD  omeprazole (PRILOSEC) 20 MG capsule Take 1 capsule (20 mg total) by mouth daily. 07/12/16   [name], MD    Allergies Ace inhibitors  Family History  Problem Relation Age of Onset  . Rheum arthritis Father   . Hyperlipidemia Father   . Heart disease Father   . Stroke Father   . Diabetes Father   . Arthritis Father   . Coronary artery disease Father   . Breast cancer Mother   . Cancer Mother        breast  . Heart disease Brother   . Coronary artery disease Brother   . Hyperlipidemia Brother   . Diabetes Brother  Social History Social History   Tobacco Use  . Smoking status: Never Smoker  . Smokeless tobacco: Current User    Types: Snuff  Substance Use Topics  . Alcohol use: No  . Drug use: No    Review of Systems Constitutional: No fever/chills Eyes: No visual changes. ENT: No sore throat. Cardiovascular: Denies chest pain. Respiratory: Denies shortness of breath. Gastrointestinal: Abdominal pain as described above with one episode of vomiting and some persistent nausea.  No bowel habit changes. Genitourinary: Negative for dysuria. Musculoskeletal: Negative for neck pain.  Negative for back  pain. Integumentary: Negative for rash. Neurological: Negative for headaches, focal weakness or numbness.   ____________________________________________   PHYSICAL EXAM:  VITAL SIGNS: ED Triage Vitals  Enc Vitals Group     BP 07/05/17 2340 (!) 173/69     Pulse Rate 07/05/17 2340 67     Resp 07/05/17 2340 18     Temp 07/05/17 2340 97.6 F (36.4 C)     Temp Source 07/05/17 2340 Oral     SpO2 07/05/17 2340 97 %     Weight 07/05/17 2340 78.5 kg (173 lb)     Height 07/05/17 2340 1.778 m (5\' 10" )     Head Circumference --      Peak Flow --      Pain Score 07/05/17 2350 9     Pain Loc --      Pain Edu? --      Excl. in [place]? --     Constitutional: Alert and oriented. Well appearing and in no acute distress. Eyes: Conjunctivae are normal.  Head: Atraumatic. Nose: No congestion/rhinnorhea. Mouth/Throat: Mucous membranes are moist. Neck: No stridor.  No meningeal signs.   Cardiovascular: Normal rate, regular rhythm. Good peripheral circulation. Grossly normal heart sounds. Respiratory: Normal respiratory effort.  No retractions. Lungs CTAB. Gastrointestinal: Soft, possible mild distention, mild to moderate generalized tenderness throughout the abdomen without any specific focal tenderness, no Murphy sign, no exquisite tenderness to the right lower quadrant. Musculoskeletal: No lower extremity tenderness nor edema. No gross deformities of extremities. Neurologic:  Normal speech and language. No gross focal neurologic deficits are appreciated.  Skin:  Skin is warm, dry and intact. No rash noted. Psychiatric: Mood and affect are normal. Speech and behavior are normal.  ____________________________________________   LABS (all labs ordered are listed, but only abnormal results are displayed)  Labs Reviewed  CBC WITH DIFFERENTIAL/PLATELET - Abnormal; Notable for the following components:      Result Value   WBC 12.3 (*)    Neutro Abs 9.7 (*)    All other components within normal  limits  BASIC METABOLIC PANEL - Abnormal; Notable for the following components:   Sodium 134 (*)    Chloride 100 (*)    Glucose, Bld 169 (*)    All other components within normal limits  URINALYSIS, COMPLETE (UACMP) WITH MICROSCOPIC - Abnormal; Notable for the following components:   Color, Urine YELLOW (*)    APPearance CLEAR (*)    Hgb urine dipstick SMALL (*)    All other components within normal limits  LACTIC ACID, PLASMA - Abnormal; Notable for the following components:   Lactic Acid, Venous 2.8 (*)    All other components within normal limits  HEPATIC FUNCTION PANEL - Abnormal; Notable for the following components:   Bilirubin, Direct <0.1 (*)    All other components within normal limits  TROPONIN I  LIPASE, BLOOD  LACTIC ACID, PLASMA   ____________________________________________  EKG  ED ECG REPORT I, Hinda Kehr, the attending physician, personally viewed and interpreted this ECG.  Date: 07/07/2017 EKG Time: 00:00 Rate: 68 Rhythm: normal sinus rhythm QRS Axis: normal Intervals: normal ST/T Wave abnormalities: normal Narrative Interpretation: no evidence of acute ischemia  ____________________________________________  RADIOLOGY   ED MD interpretation: Questionable findings of gallbladder wall thickening on CT scan, radiology recommended ultrasound.  Official radiology report(s): Ct Abdomen Pelvis W Contrast  Result Date: 07/06/2017 CLINICAL DATA:  Abdominal pain, nausea, vomiting EXAM: CT ABDOMEN AND PELVIS WITH CONTRAST TECHNIQUE: Multidetector CT imaging of the abdomen and pelvis was performed using the standard protocol following bolus administration of intravenous contrast. CONTRAST:  126mL ISOVUE-300 IOPAMIDOL (ISOVUE-300) INJECTION 61% COMPARISON:  None. FINDINGS: Lower chest: No acute findings Hepatobiliary: There is gallbladder wall thickening and/or pericholecystic fluid. Cannot exclude cholecystitis. Small low-density area within the inferior right  hepatic lobe measures 8 mm. Small low-density lesion in the right hepatic lobe on image 24 measures 5 mm. These are difficult to characterize due to their small size. Pancreas: No focal abnormality or ductal dilatation. Spleen: No focal abnormality.  Normal size. Adrenals/Urinary Tract: Parapelvic cysts bilaterally. No hydronephrosis. No adrenal mass. Urinary bladder unremarkable. Stomach/Bowel: Moderate stool burden throughout the colon. Appendix is visualized and is normal. Stomach, large and small bowel grossly unremarkable. Small amount of fluid in the right paracolic gutter. Underlying colon appears unremarkable. This may be related to the adjacent pericholecystic fluid. Vascular/Lymphatic: Aortic atherosclerosis. No enlarged abdominal or pelvic lymph nodes. Reproductive: Mild prostate enlargement Other: Trace right paracolic gutter fluid.  No free air. Musculoskeletal: No acute bony abnormality. IMPRESSION: Pericholecystic fluid and/or gallbladder wall thickening. Small amount of fluid in the adjacent right paracolic gutter. Cannot exclude cholecystitis. Consider further evaluation with right upper quadrant ultrasound if felt clinically indicated. Small low-density lesions in the right hepatic lobe, too small to characterize. Moderate stool burden throughout the colon. Mild prostate enlargement. Aortic atherosclerosis. Electronically Signed   By: Rolm Baptise M.D.   On: 07/06/2017 07:23   US Abdomen Limited Ruq  Result Date: 07/06/2017 CLINICAL DATA:  Abdominal pain for 12 hours. EXAM: ULTRASOUND ABDOMEN LIMITED RIGHT UPPER QUADRANT COMPARISON:  CT of earlier today FINDINGS: Gallbladder: No gallstones identified. Gallbladder wall thickening and edema. Example at 9 mm on image 6. Technologist describes gallbladder tenderness with palpation. Common bile duct: Diameter: Normal, 5 mm. Liver: No focal lesion identified. Within normal limits in parenchymal echogenicity. Portal vein is patent on color Doppler  imaging with normal direction of blood flow towards the liver. IMPRESSION: Gallbladder wall thickening with tenderness to gallbladder palpation. No gallstones. Findings are suspicious for acalculous cholecystitis. Depending on clinical symptomatology, nuclear medicine hepatobiliary study could be confirmative. No biliary duct dilatation. Electronically Signed   By: Abigail Miyamoto M.D.   On: 07/06/2017 08:13    ____________________________________________   PROCEDURES  Critical Care performed: No   Procedure(s) performed:   Procedures   ____________________________________________   INITIAL IMPRESSION / ASSESSMENT AND PLAN / ED COURSE  As part of my medical decision making, I reviewed the following data within the [place] notes reviewed and incorporated, Labs reviewed  and Patient signed out to     Differential diagnosis includes, but is not limited to, biliary disease (biliary colic, acute cholecystitis, cholangitis, choledocholithiasis, etc), intrathoracic causes for epigastric abdominal pain including ACS, gastritis, duodenitis, pancreatitis, diverticulitis, small bowel or large bowel obstruction, abdominal aortic aneurysm, hernia, and gastritis.  Initially my thought was concern for biliary colic  but on physical exam this is all throughout his abdomen and not localized to the right upper quadrant.  He has no significant epigastric tenderness either.  He reports that his pain is starting around his umbilicus and radiating outward.  His presentation is not strongly consistent with a viral gastroenteritis given that he had only one episode of nausea and vomiting.  It occurred within about an hour of eating which suggests either a foodborne pathogen or biliary colic.  However, given the possibility of diverticulitis, appendicitis, or other acute intra-abdominal emergency, and given the inconsistency with physical exam with biliary colic, I will proceed with a CT  scan of his abdomen.  I explained that if the CT is unremarkable or suggestive of biliary disease, he may benefit from an ultrasound as well.  He understands and agrees with the plan.  He still has mild persistent pain but it is much improved from prior.  Initial lactic acid is elevated but I do not believe this represents severe sepsis.  I will give fluids and recheck a lactic acid.  He has a mild leukocytosis of 49.6, normal metabolic panel, normal hepatic function panel, and negative urinalysis.   Clinical Course as of Jul 06 849  Sun Jul 06, 2017  0733 Improved lactic acid after 1L NS Lactic Acid, Venous: 1.9 [CF]  0811 CT scan was suggestive of gallbladder disease but radiology recommended an ultrasound.  I updated the patient and have ordered the ultrasound.  [CF]  [phone] The patient's ultrasound is concerning for gallbladder wall thickening, edema, and pericholecystic fluid, but there is no evidence of stones.  This is most consistent with acalculus cholecystitis.  I called and spoke with phone with Dr. Dahlia Byes with surgery who will come see the patient in person and provide additional recommendations.  We discussed antibiotics and he did agree with my plan to administer empiric Zosyn and he recommended another liter of fluids.  I let him know that the patient's lactic acid has come down since the initial elevation.  He will see the patient shortly.  I updated the patient and his wife.  [CF]  [phone] Discussed at bedside with Dr. Dahlia Byes and he will admit for surgery.  [CF]    Clinical Course User Index [CF] Hinda Kehr, MD    ____________________________________________  FINAL CLINICAL IMPRESSION(S) / ED DIAGNOSES  Final diagnoses:  Acute acalculous cholecystitis     MEDICATIONS GIVEN DURING THIS VISIT:  Medications  piperacillin-tazobactam (ZOSYN) IVPB 3.375 g (3.375 g Intravenous New Bag/Given 07/06/17 0848)  oxyCODONE-acetaminophen (PERCOCET/ROXICET) 5-325 MG per tablet 1 tablet (1  tablet Oral Given 07/06/17 0147)  ondansetron (ZOFRAN-ODT) disintegrating tablet 4 mg (4 mg Oral Given 07/06/17 0148)  morphine 4 MG/ML injection 4 mg (4 mg Intravenous Given 07/06/17 0422)  ondansetron (ZOFRAN) injection 4 mg (4 mg Intravenous Given 07/06/17 0422)  sodium chloride 0.9 % bolus 1,000 mL (0 mLs Intravenous Stopped 07/06/17 0650)  iopamidol (ISOVUE-300) 61 % injection 100 mL (100 mLs Intravenous Contrast Given 07/06/17 0706)  sodium chloride 0.9 % bolus 1,000 mL (1,000 mLs Intravenous New Bag/Given 07/06/17 0849)     ED Discharge Orders    None       Note:  This document was prepared using Dragon voice recognition software and may include unintentional dictation errors.    Hinda Kehr, MD 07/06/17 6659    Hinda Kehr, MD 07/14/17 1447

## 2017-07-06 NOTE — Op Note (Signed)
Laparoscopic Cholecystectomy  Pre-operative Diagnosis: Acute cholecystitis  Post-operative Diagnosis: Same  Procedure: Lap cholecystectomy  Surgeon: Caroleen Hamman, MD FACS  Anesthesia: Gen. with endotracheal tube    Findings:  Cholecystitis   Estimated Blood Loss: 25cc                  Specimens: Gallbladder           Complications: none   Procedure Details  The patient was seen again in the Holding Room. The benefits, complications, treatment options, and expected outcomes were discussed with the patient. The risks of bleeding, infection, recurrence of symptoms, failure to resolve symptoms, bile duct damage, bile duct leak, retained common bile duct stone, bowel injury, any of which could require further surgery and/or ERCP, stent, or papillotomy were reviewed with the patient. The likelihood of improving the patient's symptoms with return to their baseline status is good.  The patient and/or family concurred with the proposed plan, giving informed consent.  The patient was taken to Operating Room, identified as Mark Davidson and the procedure verified as Laparoscopic Cholecystectomy.  A Time Out was held and the above information confirmed.  Prior to the induction of general anesthesia, antibiotic prophylaxis was administered. VTE prophylaxis was in place. General endotracheal anesthesia was then administered and tolerated well. After the induction, the abdomen was prepped with Chloraprep and draped in the sterile fashion. The patient was positioned in the supine position.  Cut down technique was used to enter the abdominal cavity and a Hasson trochar was placed after two vicryl stitches were anchored to the fascia. Pneumoperitoneum was then created with CO2 and tolerated well without any adverse changes in the patient's vital signs.  Three 5-mm ports were placed in the right upper quadrant all under direct vision. All skin incisions  were infiltrated with a local anesthetic  agent before making the incision and placing the trocars.   The patient was positioned  in reverse Trendelenburg, tilted slightly to the patient's left.  The gallbladder was identified, the fundus grasped and retracted cephalad. Adhesions were lysed bluntly. The infundibulum was grasped and retracted laterally, exposing the peritoneum overlying the triangle of Calot. This was then divided and exposed in a blunt fashion. An extended critical view of the cystic duct and cystic artery was obtained.  The cystic duct was clearly identified and bluntly dissected.   Artery and duct were double clipped and divided.  The gallbladder was taken from the gallbladder fossa in a retrograde fashion with the electrocautery. The gallbladder was removed and placed in an Endocatch bag. The liver bed was irrigated and inspected. Hemostasis was achieved with the electrocautery. Copious irrigation was utilized and was repeatedly aspirated until clear.  The gallbladder and Endocatch sac were then removed through a port site.   Inspection of the right upper quadrant was performed. No bleeding, bile duct injury or leak, or bowel injury was noted. Pneumoperitoneum was released.  The periumbilical port site was closed with interrumpted 0 Vicryl sutures. 4-0 subcuticular Monocryl was used to close the skin. Dermabond was  applied.  The patient was then extubated and brought to the recovery room in stable condition. Sponge, lap, and needle counts were correct at closure and at the conclusion of the case.               Caroleen Hamman, MD, FACS

## 2017-07-06 NOTE — ED Notes (Signed)
Patient reports eating at approx 1500-1600 today. Patient c/o mid/medial abdominal pain beginning at 1800. Patient reports N/V with 1 small emesis. Patient reports normal BM yesterday morning.

## 2017-07-06 NOTE — Anesthesia Preprocedure Evaluation (Addendum)
Anesthesia Evaluation  Patient identified by MRN, date of birth, ID band Patient awake    Reviewed: Allergy & Precautions, H&P , NPO status , Patient's Chart, lab work & pertinent test results, reviewed documented beta blocker date and time   History of Anesthesia Complications (+) DIFFICULT AIRWAY and history of anesthetic complications  Airway Mallampati: II  TM Distance: >3 FB Neck ROM: full    Dental  (+) Teeth Intact   Pulmonary neg pulmonary ROS,    Pulmonary exam normal        Cardiovascular Exercise Tolerance: Good hypertension, On Medications + Peripheral Vascular Disease  negative cardio ROS Normal cardiovascular exam Rhythm:regular Rate:Normal     Neuro/Psych negative neurological ROS  negative psych ROS   GI/Hepatic negative GI ROS, Neg liver ROS,   Endo/Other  negative endocrine ROSdiabetes, Well Controlled  Renal/GU negative Renal ROS  negative genitourinary   Musculoskeletal   Abdominal   Peds  Hematology negative hematology ROS (+)   Anesthesia Other Findings Past Medical History: No date: Arthritis No date: Carotid stenosis No date: Colon polyp No date: Diabetes mellitus type 2, uncontrolled, without  complications ([facility]) No date: ED (erectile dysfunction) No date: Hyperlipemia No date: Hypertension Past Surgical History: 2009: CARDIAC CATHETERIZATION     Comment:  normal per pt 03/2012: carotid US     Comment:  bilateral 40-59% stenosis, rec rpt 1 yr No date: TONSILLECTOMY AND ADENOIDECTOMY BMI    Body Mass Index:  24.82 kg/m     Reproductive/Obstetrics negative OB ROS                            Anesthesia Physical Anesthesia Plan  ASA: III and emergent  Anesthesia Plan: General ETT   Post-op Pain Management:    Induction:   PONV Risk Score and Plan:   Airway Management Planned:   Additional Equipment:   Intra-op Plan:   Post-operative Plan:    Informed Consent: I have reviewed the patients History and Physical, chart, labs and discussed the procedure including the risks, benefits and alternatives for the proposed anesthesia with the patient or authorized representative who has indicated his/her understanding and acceptance.   Dental Advisory Given  Plan Discussed with: CRNA  Anesthesia Plan Comments:         Anesthesia Quick Evaluation

## 2017-07-06 NOTE — ED Notes (Signed)
Pt's wife up to stat registration asking about wait time; says her husband's pain is getting worse and he can't take it much more; pt says he just vomited before coming in, liquid with food; pt says there is a sharp pain in his abd that started a few hours after eating out; pt says last BM was this am and it was normal;

## 2017-07-06 NOTE — ED Notes (Signed)
Called report to Gwynneth Munson RN

## 2017-07-07 ENCOUNTER — Encounter: Payer: Self-pay | Admitting: Surgery

## 2017-07-07 LAB — COMPREHENSIVE METABOLIC PANEL
ALBUMIN: 3.3 g/dL — AB (ref 3.5–5.0)
ALT: 69 U/L — ABNORMAL HIGH (ref 17–63)
AST: 57 U/L — ABNORMAL HIGH (ref 15–41)
Alkaline Phosphatase: 48 U/L (ref 38–126)
Anion gap: 10 (ref 5–15)
BUN: 15 mg/dL (ref 6–20)
CHLORIDE: 105 mmol/L (ref 101–111)
CO2: 23 mmol/L (ref 22–32)
Calcium: 8.6 mg/dL — ABNORMAL LOW (ref 8.9–10.3)
Creatinine, Ser: 0.92 mg/dL (ref 0.61–1.24)
GFR calc Af Amer: >60 mL/min (ref >60)
Glucose, Bld: 158 mg/dL — ABNORMAL HIGH (ref 65–99)
POTASSIUM: 3.6 mmol/L (ref 3.5–5.1)
Sodium: 138 mmol/L (ref 135–145)
Total Bilirubin: 1.1 mg/dL (ref 0.3–1.2)
Total Protein: 6.3 g/dL — ABNORMAL LOW (ref 6.5–8.1)

## 2017-07-07 LAB — CBC
HCT: 36.8 % — ABNORMAL LOW (ref 40.0–52.0)
Hemoglobin: 12.3 g/dL — ABNORMAL LOW (ref 13.0–18.0)
MCH: 30.4 pg (ref 26.0–34.0)
MCHC: 33.4 g/dL (ref 32.0–36.0)
MCV: 90.9 fL (ref 80.0–100.0)
PLATELETS: 151 10*3/uL (ref 150–440)
RBC: 4.05 MIL/uL — ABNORMAL LOW (ref 4.40–5.90)
RDW: 13.3 % (ref 11.5–14.5)
WBC: 15.9 10*3/uL — ABNORMAL HIGH (ref 3.8–10.6)

## 2017-07-07 LAB — GLUCOSE, CAPILLARY: Glucose-Capillary: 132 mg/dL — ABNORMAL HIGH (ref 65–99)

## 2017-07-07 MED ORDER — OXYCODONE-ACETAMINOPHEN 5-325 MG PO TABS: 1.0000 | ORAL_TABLET | ORAL | 0 refills | Status: DC | PRN

## 2017-07-07 NOTE — Discharge Instructions (Signed)
In addition to included general post-operative instructions for Laparoscopic Cholecystectomy,  Diet: Resume home heart healthy diet (as discussed).  Activity: No heavy lifting >20 pounds (children, pets, laundry, garbage) or strenuous activity until follow-up, but light activity and walking are encouraged. Do not drive or drink alcohol if taking narcotic pain medications.  Wound care: 2 days after surgery (Tuesday, 3/12), may shower/get incision wet with soapy water and pat dry (do not rub incisions), but no baths or submerging incision underwater until follow-up.   Medications: Resume all home medications. For mild to moderate pain: acetaminophen (Tylenol) or ibuprofen/naproxen (if no kidney disease). Combining Tylenol with alcohol can substantially increase your risk of causing liver disease. Narcotic pain medications, if prescribed, can be used for severe pain, though may cause nausea, constipation, and drowsiness. Do not combine Tylenol and Percocet (or similar) within a 6 hour period as Percocet (and similar) contain(s) Tylenol. If you do not need the narcotic pain medication, you do not need to fill the prescription.  Call office [phone]) at any time if any questions, worsening pain, fevers/chills, bleeding, drainage from incision site, or other concerns.

## 2017-07-07 NOTE — Progress Notes (Signed)
Pt discharged per MD order. Prescription given to pt. Discharge instructions reviewed with pt. Pt verbalized understanding and all questions answered to pt satisfaction. IV removed. Pt declined wheelchair and chose to walk to car.

## 2017-07-08 LAB — HIV ANTIBODY (ROUTINE TESTING W REFLEX): HIV Screen 4th Generation wRfx: NONREACTIVE

## 2017-07-09 ENCOUNTER — Telehealth: Payer: Self-pay

## 2017-07-09 LAB — SURGICAL PATHOLOGY

## 2017-07-09 NOTE — Telephone Encounter (Signed)
He doesn't need hosp f/u with me - but he is overdue for physical - can we call and schedule this? Thanks!

## 2017-07-09 NOTE — Telephone Encounter (Signed)
Dr. Darnell Level,  Patient is on hospital d/c list s/p urgent cholecystectomy without complication.  He has a hospital follow up with General Surgery on 07/21/17.  There is no request for PCP follow up and he does not qualify for TCM.  Does he also need hospital follow up with you?    Thanks.

## 2017-07-10 NOTE — Discharge Summary (Signed)
Physician Discharge Summary  Patient ID: Mark Davidson MRN: [id number] DOB/AGE: [birth year] 65 y.o.  Admit date: 07/06/2017 Discharge date: 07/10/2017  Admission Diagnoses:  Discharge Diagnoses:  Active Problems:   Cholecystitis, acute   Discharged Condition: good  Hospital Course: 67 y.o. male presented to [facility] ED for abdominal pain. Workup was found to be significant for CT and ultrasound imaging demonstrating cholecystitis with biliary "sludge". Informed consent was obtained and documented, and patient underwent uneventful laparoscopic cholecystectomy ([place], 07/06/2017).  Post-operatively, patient's pain improved/resolved and advancement of patient's diet and ambulation were well-tolerated. The remainder of patient's hospital course was essentially unremarkable, and discharge planning was initiated accordingly with patient safely able to be discharged home with appropriate discharge instructions, pain control, and outpatient surgical follow-up after all of his and family's questions were answered to their expressed satisfaction.  Consults: None  Significant Diagnostic Studies: labs: WBC - 15.9 and radiology: CT scan: cholecystitis and Ultrasound: cholecystitis  Treatments: IV hydration and surgery: laparoscopic cholecystectomy  Discharge Exam: Blood pressure 125/60, pulse 72, temperature (!) 97.4 F (36.3 C), temperature source Oral, resp. rate 18, height 5\' 10"  (1.778 m), weight 173 lb (78.5 kg), SpO2 97 %. General appearance: alert, cooperative and no distress GI: abdomen soft and non-distended with mild peri-incisional tenderness to palpation with incisions well-approximated without erythema or drainage  Disposition: 01-Home or Self Care   Allergies as of 07/07/2017      Reactions   Ace Inhibitors Cough      Medication List    TAKE these medications   atorvastatin 40 MG tablet Commonly known as:  LIPITOR Take 1 tablet (40 mg total) by mouth daily.    diphenhydramine-acetaminophen 25-500 MG Tabs tablet Commonly known as:  TYLENOL PM Take 1 tablet by mouth at bedtime.   losartan-hydrochlorothiazide 50-12.5 MG tablet Commonly known as:  HYZAAR Take 1 tablet by mouth daily.   metFORMIN 500 MG tablet Commonly known as:  GLUCOPHAGE Take 1 tablet (500 mg total) by mouth daily with breakfast.   omeprazole 20 MG capsule Commonly known as:  PRILOSEC Take 1 capsule (20 mg total) by mouth daily.   oxyCODONE-acetaminophen 5-325 MG tablet Commonly known as:  PERCOCET/ROXICET Take 1-2 tablets by mouth every 4 (four) hours as needed for severe pain.      Follow-up Information    Jules Husbands, MD. Go on 07/21/2017.   Specialty:  General Surgery Why:  Monday at 9:00am for hospital follow-up Contact information: [place] [address] [phone]           Signed: Vickie Epley 07/10/2017, 5:30 PM

## 2017-07-11 NOTE — Telephone Encounter (Signed)
LM at patient's home number to please have him call back.  I did not leave details as do not have DPR permission to leave a detailed message.

## 2017-07-14 ENCOUNTER — Telehealth: Payer: Self-pay

## 2017-07-14 NOTE — Telephone Encounter (Signed)
Copied from [place]. Topic: Quick Communication - Office Called Patient >> Jul 11, 2017  2:23 PM Diamond Nickel, RN wrote: Reason for CRM: [facility] for patient to call back.  Details were not left; however, if patient calls back, please have him schedule a CPE w/lab appt 1 week before with Dr. Danise Mina.  OKAY for CPE appointment to be at least 4-6 weeks out as patient has a lot going on at the present.  Thanks.   >> Jul 11, 2017  2:50 PM Wynetta Emery, [place] C wrote: Pt called in returning call. Pt says that he would like to call back in to schedule his CPE.

## 2017-07-14 NOTE — Telephone Encounter (Signed)
Dr. Danise Mina requested that we get appointment made for patient for CPE.  Please note patient has refused to set up appointment at this time and states that he will call back to schedule.    Will forward to PCP as an FYI.

## 2017-07-14 NOTE — Telephone Encounter (Signed)
Patient called and refused to schedule CPE at this time.  States he will call back.  Have forwarded this response to Dr. Darnell Level as an Juluis Rainier.

## 2017-07-15 NOTE — Anesthesia Postprocedure Evaluation (Signed)
Anesthesia Post Note  Patient: Mark Davidson  Procedure(s) Performed: LAPAROSCOPIC CHOLECYSTECTOMY (N/A Abdomen)  Patient location during evaluation: PACU Anesthesia Type: General Level of consciousness: awake and alert Pain management: pain level controlled Vital Signs Assessment: post-procedure vital signs reviewed and stable Respiratory status: spontaneous breathing, nonlabored ventilation, respiratory function stable and patient connected to nasal cannula oxygen Cardiovascular status: blood pressure returned to baseline and stable Postop Assessment: no apparent nausea or vomiting Anesthetic complications: no     Last Vitals:  Vitals:   07/06/17 1912 07/07/17 0414  BP: (!) 126/53 125/60  Pulse: 78 72  Resp: 18 18  Temp: 36.8 C (!) 36.3 C  SpO2: 96% 97%    Last Pain:  Vitals:   07/07/17 0824  TempSrc:   PainSc: 2                  Molli Barrows

## 2017-07-21 ENCOUNTER — Encounter: Payer: Self-pay | Admitting: Surgery

## 2017-07-21 ENCOUNTER — Ambulatory Visit (INDEPENDENT_AMBULATORY_CARE_PROVIDER_SITE_OTHER): Payer: BLUE CROSS/BLUE SHIELD | Admitting: Surgery

## 2017-07-21 VITALS — BP 172/76 | HR 65 | Temp 97.5°F | Ht 70.0 in | Wt 169.4 lb

## 2017-07-21 DIAGNOSIS — Z09 Encounter for follow-up examination after completed treatment for conditions other than malignant neoplasm: Secondary | ICD-10-CM

## 2017-07-21 NOTE — Patient Instructions (Signed)

## 2017-07-22 ENCOUNTER — Encounter: Payer: Self-pay | Admitting: Surgery

## 2017-07-22 ENCOUNTER — Other Ambulatory Visit: Payer: Self-pay | Admitting: Family Medicine

## 2017-07-22 NOTE — Progress Notes (Signed)
S/p lap chole Path d/w pt  Doing well no complaints  PE NAD Abd: soft, nt, incisions c/d/i  A/p Doing well No heavy lifting total 6 weeks from the date of surgery Work excuse will be given RTC prn

## 2017-07-28 ENCOUNTER — Other Ambulatory Visit: Payer: Self-pay | Admitting: Family Medicine

## 2017-08-04 ENCOUNTER — Other Ambulatory Visit: Payer: Self-pay | Admitting: Family Medicine

## 2017-08-06 ENCOUNTER — Other Ambulatory Visit: Payer: Self-pay | Admitting: Family Medicine

## 2017-08-06 NOTE — Telephone Encounter (Signed)
Copied from [place] [id number]. Topic: Quick Communication - Rx Refill/Question >> Aug 06, 2017  9:26 AM Synthia Innocent wrote: Medication: losartan-hydrochlorothiazide (HYZAAR) 50-12.5 MG tablet  Has the patient contacted their pharmacy? Yes.   (Agent: If no, request that the patient contact the pharmacy for the refill.) [facility] (with phone number [facility] [address]  Agent: Please be advised that RX refills may take up to 3 business days. We ask that you follow-up with your pharmacy.

## 2017-08-06 NOTE — Telephone Encounter (Signed)
[facility]ill showing received on 07/28/17.  Told by the pharmacist it has been recalled and is suggested separate rxs be sent in.

## 2017-08-07 ENCOUNTER — Telehealth: Payer: Self-pay | Admitting: Family Medicine

## 2017-08-07 MED ORDER — VALSARTAN-HYDROCHLOROTHIAZIDE 80-12.5 MG PO TABS: 1.0000 | ORAL_TABLET | Freq: Every day | ORAL | 1 refills | Status: DC

## 2017-08-07 MED ORDER — AZILSARTAN-CHLORTHALIDONE 40-12.5 MG PO TABS: 1.0000 | ORAL_TABLET | Freq: Every day | ORAL | 1 refills | Status: DC

## 2017-08-07 NOTE — Telephone Encounter (Signed)
Left message for pt to call back.  Need to relay Dr. G's message.  

## 2017-08-07 NOTE — Telephone Encounter (Signed)
Pt called in to make provider aware of the 2 medications that his insurance will cover.   1. Olmesartan 2. Balsargan   Please assist further.

## 2017-08-07 NOTE — Telephone Encounter (Signed)
New medicine sent in - will try this combo pill. Thanks.

## 2017-08-07 NOTE — Telephone Encounter (Signed)
Mark Davidson with [facility] called; Mark Davidson will notify pt of Dr Synthia Innocent message and pt will ck with pharmacy to see if new med is affordable. If med is not affordable pt will ck with ins co to see what substitute is approved by ins that would be affordable for the pt. Kelly voiced understanding and will relay to pt.FYI to [place] CMA.

## 2017-08-07 NOTE — Telephone Encounter (Signed)
Copied from [place]. Topic: Quick Communication - See Telephone Encounter >> Aug 07, 2017  8:28 AM Brenton Grills, CMA wrote: CRM for notification. See Refill encounter for: 08/06/17.  [facility] may relay Dr. Synthia Innocent message.

## 2017-08-07 NOTE — Addendum Note (Signed)
Addended by: [name] on: 08/07/2017 07:41 AM   Modules accepted: Orders

## 2017-08-07 NOTE — Addendum Note (Signed)
Addended by: [name] on: 08/07/2017 11:06 AM   Modules accepted: Orders

## 2017-08-07 NOTE — Telephone Encounter (Signed)
Due to recall plz notify pt I've sent in azilsartan chlorthalidone combo pill for pt to price out.

## 2017-08-07 NOTE — Telephone Encounter (Signed)
Patient calling because he has been in contact with his pharmacy and was told that his insurance will not cover his new medication Azilsartan Chlorthalidone. Stated that he took his last BP Medication this morning. Patient [facility] [address] [facility] [place]

## 2017-08-08 NOTE — Telephone Encounter (Signed)
New rx sent to pharmacy

## 2017-08-13 ENCOUNTER — Other Ambulatory Visit: Payer: Self-pay | Admitting: Family Medicine

## 2017-08-20 ENCOUNTER — Other Ambulatory Visit: Payer: Self-pay | Admitting: Family Medicine

## 2017-09-04 ENCOUNTER — Other Ambulatory Visit: Payer: Self-pay | Admitting: Family Medicine

## 2017-09-04 DIAGNOSIS — E785 Hyperlipidemia, unspecified: Secondary | ICD-10-CM

## 2017-09-04 DIAGNOSIS — Z125 Encounter for screening for malignant neoplasm of prostate: Secondary | ICD-10-CM

## 2017-09-04 DIAGNOSIS — E119 Type 2 diabetes mellitus without complications: Secondary | ICD-10-CM

## 2017-09-08 ENCOUNTER — Other Ambulatory Visit (INDEPENDENT_AMBULATORY_CARE_PROVIDER_SITE_OTHER): Payer: BLUE CROSS/BLUE SHIELD

## 2017-09-08 DIAGNOSIS — E119 Type 2 diabetes mellitus without complications: Secondary | ICD-10-CM

## 2017-09-08 DIAGNOSIS — Z125 Encounter for screening for malignant neoplasm of prostate: Secondary | ICD-10-CM

## 2017-09-08 DIAGNOSIS — E785 Hyperlipidemia, unspecified: Secondary | ICD-10-CM

## 2017-09-08 LAB — COMPREHENSIVE METABOLIC PANEL
ALK PHOS: 83 U/L (ref 39–117)
ALT: 20 U/L (ref 0–53)
AST: 12 U/L (ref 0–37)
Albumin: 4.2 g/dL (ref 3.5–5.2)
BUN: 15 mg/dL (ref 6–23)
CO2: 29 mEq/L (ref 19–32)
Calcium: 9.7 mg/dL (ref 8.4–10.5)
Chloride: 103 mEq/L (ref 96–112)
Creatinine, Ser: 0.87 mg/dL (ref 0.40–1.50)
GFR: 93.7 mL/min (ref >60.00)
GLUCOSE: 120 mg/dL — AB (ref 70–99)
POTASSIUM: 4.1 meq/L (ref 3.5–5.1)
SODIUM: 140 meq/L (ref 135–145)
TOTAL PROTEIN: 6.9 g/dL (ref 6.0–8.3)
Total Bilirubin: 0.7 mg/dL (ref 0.2–1.2)

## 2017-09-08 LAB — LIPID PANEL
CHOL/HDL RATIO: 3
Cholesterol: 143 mg/dL (ref 0–200)
HDL: 53.2 mg/dL (ref >39.00)
LDL Cholesterol: 73 mg/dL (ref 0–99)
NONHDL: 90.03
Triglycerides: 86 mg/dL (ref 0.0–149.0)
VLDL: 17.2 mg/dL (ref 0.0–40.0)

## 2017-09-08 LAB — PSA: PSA: 2.17 ng/mL (ref 0.10–4.00)

## 2017-09-08 LAB — HEMOGLOBIN A1C: HEMOGLOBIN A1C: 6 % (ref 4.6–6.5)

## 2017-09-10 ENCOUNTER — Ambulatory Visit (INDEPENDENT_AMBULATORY_CARE_PROVIDER_SITE_OTHER): Payer: BLUE CROSS/BLUE SHIELD | Admitting: Family Medicine

## 2017-09-10 ENCOUNTER — Encounter: Payer: Self-pay | Admitting: Family Medicine

## 2017-09-10 VITALS — BP 120/68 | HR 64 | Temp 97.9°F | Ht 68.5 in | Wt 172.0 lb

## 2017-09-10 DIAGNOSIS — I6523 Occlusion and stenosis of bilateral carotid arteries: Secondary | ICD-10-CM | POA: Diagnosis not present

## 2017-09-10 DIAGNOSIS — M7071 Other bursitis of hip, right hip: Secondary | ICD-10-CM | POA: Insufficient documentation

## 2017-09-10 DIAGNOSIS — I10 Essential (primary) hypertension: Secondary | ICD-10-CM | POA: Diagnosis not present

## 2017-09-10 DIAGNOSIS — Z1211 Encounter for screening for malignant neoplasm of colon: Secondary | ICD-10-CM

## 2017-09-10 DIAGNOSIS — Z23 Encounter for immunization: Secondary | ICD-10-CM

## 2017-09-10 DIAGNOSIS — E119 Type 2 diabetes mellitus without complications: Secondary | ICD-10-CM

## 2017-09-10 DIAGNOSIS — E785 Hyperlipidemia, unspecified: Secondary | ICD-10-CM

## 2017-09-10 DIAGNOSIS — Z0000 Encounter for general adult medical examination without abnormal findings: Secondary | ICD-10-CM | POA: Diagnosis not present

## 2017-09-10 DIAGNOSIS — M5431 Sciatica, right side: Secondary | ICD-10-CM

## 2017-09-10 MED ORDER — OMEPRAZOLE 20 MG PO CPDR: DELAYED_RELEASE_CAPSULE | ORAL | 3 refills | Status: DC

## 2017-09-10 MED ORDER — VALSARTAN-HYDROCHLOROTHIAZIDE 80-12.5 MG PO TABS: 1.0000 | ORAL_TABLET | Freq: Every day | ORAL | 1 refills | Status: DC

## 2017-09-10 MED ORDER — METFORMIN HCL 500 MG PO TABS: 500.0000 mg | ORAL_TABLET | Freq: Every day | ORAL | 3 refills | Status: DC

## 2017-09-10 MED ORDER — ATORVASTATIN CALCIUM 40 MG PO TABS: 40.0000 mg | ORAL_TABLET | Freq: Every day | ORAL | 3 refills | Status: DC

## 2017-09-10 NOTE — Progress Notes (Signed)
BP 120/68 (BP Location: Left Arm, Patient Position: Sitting, Cuff Size: Normal)   Pulse 64   Temp 97.9 F (36.6 C) (Oral)   Ht 5' 8.5" (1.74 m)   Wt 172 lb (78 kg)   SpO2 98%   BMI 25.77 kg/m    CC: CPE Subjective:    Patient ID: Mark Davidson, male    DOB: 12/15/1950, 67 y.o.   MRN: [id number]  HPI: Mark Davidson is a 67 y.o. male presenting on 09/10/2017 for Annual Exam   Emergent cholecystectomy 06/2017 for acute cholecystitis. This healed well.   Over the past week, noticing some R sciatic discomfort with paresthesia down R leg. Some residual L calf discomfort when he elevates L leg. No calf pain with walking. Worse with prolonged sitting such as mowing lawn  Preventative: Colon cancer screening - trouble completing stool kits - only has BM at work. Endorses remote colonoscopy with polyps. No records available. Will refer for repeat colonoscopy.  Prostate cancer screening - discussed. Requests checking today.  Flu shot yearly Tetanus - 2012 Pneumovax today shingrix discussed - interested Seat belt use discussed Sunscreen use discussed. No changing moles on skin Non smoker Alcohol - 1 shot of whiskey nightly (mixed drink) Eye exam - due Dentist - hasn't seen  Caffeine: 4-5 sodas/day, 1 cup coffee/day Lives with wife, 1 dog. 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 Diet: poor fruits and vegetables, good water intake. Lots of red meat   Relevant past medical, surgical, family and social history reviewed and updated as indicated. Interim medical history since our last visit reviewed. Allergies and medications reviewed and updated. Outpatient Medications Prior to Visit  Medication Sig Dispense Refill  . atorvastatin (LIPITOR) 40 MG tablet TAKE 1 TABLET BY MOUTH EVERY DAY 90 tablet 1  . losartan-hydrochlorothiazide (HYZAAR) 50-12.5 MG tablet TAKE 1 TABLET BY MOUTH EVERY  DAY 90 tablet 1  . metFORMIN (GLUCOPHAGE) 500 MG tablet TAKE 1 TABLET (500 MG TOTAL) BY MOUTH DAILY WITH BREAKFAST. 90 tablet 0  . omeprazole (PRILOSEC) 20 MG capsule TAKE 1 CAPSULE BY MOUTH EVERY DAY. NEEDS OV FOR REFILLS 30 capsule 0  . valsartan-hydrochlorothiazide (DIOVAN HCT) 80-12.5 MG tablet Take 1 tablet by mouth daily. 90 tablet 1  . diphenhydramine-acetaminophen (TYLENOL PM) 25-500 MG TABS Take 1 tablet by mouth at bedtime.      No facility-administered medications prior to visit.      Per HPI unless specifically indicated in ROS section below Review of Systems  Constitutional: Negative for activity change, appetite change, chills, fatigue, fever and unexpected weight change.  HENT: Negative for hearing loss.   Eyes: Negative for visual disturbance.  Respiratory: Positive for cough (mild). Negative for chest tightness, shortness of breath and wheezing.   Cardiovascular: Negative for chest pain, palpitations and leg swelling.  Gastrointestinal: Negative for abdominal distention, abdominal pain, blood in stool, constipation, diarrhea, nausea and vomiting.  Genitourinary: Negative for difficulty urinating and hematuria.  Musculoskeletal: Negative for arthralgias, myalgias and neck pain.  Skin: Negative for rash.  Neurological: Negative for dizziness, seizures, syncope and headaches.  Hematological: Negative for adenopathy. Does not bruise/bleed easily.  Psychiatric/Behavioral: Negative for dysphoric mood. The patient is not nervous/anxious.        Objective:    BP 120/68 (BP Location: Left Arm, Patient Position: Sitting, Cuff Size: Normal)   Pulse 64   Temp 97.9 F (36.6 C) (Oral)   Ht 5'  8.5" (1.74 m)   Wt 172 lb (78 kg)   SpO2 98%   BMI 25.77 kg/m   Wt Readings from Last 3 Encounters:  09/10/17 172 lb (78 kg)  07/21/17 169 lb 6.4 oz (76.8 kg)  07/05/17 173 lb (78.5 kg)    Physical Exam  Constitutional: He is oriented to person, place, and time. He appears  well-developed and well-nourished. No distress.  HENT:  Head: Normocephalic and atraumatic.  Right Ear: Hearing, tympanic membrane, external ear and ear canal normal.  Left Ear: Hearing, tympanic membrane, external ear and ear canal normal.  Nose: Nose normal.  Mouth/Throat: Uvula is midline, oropharynx is clear and moist and mucous membranes are normal. No oropharyngeal exudate, posterior oropharyngeal edema or posterior oropharyngeal erythema.  Eyes: Pupils are equal, round, and reactive to light. Conjunctivae and EOM are normal. No scleral icterus.  Neck: Normal range of motion. Neck supple. Carotid bruit is not present. No thyromegaly present.  Cardiovascular: Normal rate, regular rhythm, normal heart sounds and intact distal pulses.  No murmur heard. Pulses:      Radial pulses are 2+ on the right side, and 2+ on the left side.  Pulmonary/Chest: Effort normal and breath sounds normal. No respiratory distress. He has no wheezes. He has no rales.  Abdominal: Soft. Bowel sounds are normal. He exhibits no distension and no mass. There is no tenderness. There is no rebound and no guarding.  Musculoskeletal: Normal range of motion. He exhibits no edema.  See HPI for foot exam if done No pain midline spine No paraspinous mm tenderness + SLR on right  No pain with int/ext rotation at hip. Neg FABER. No pain at SIJ, GTB or sciatic notch bilaterally.   Lymphadenopathy:    He has no cervical adenopathy.  Neurological: He is alert and oriented to person, place, and time.  CN grossly intact, station and gait intact  Skin: Skin is warm and dry. No rash noted.  Psychiatric: He has a normal mood and affect. His behavior is normal. Judgment and thought content normal.  Nursing note and vitals reviewed.  Results for orders placed or performed in visit on 09/08/17  PSA  Result Value Ref Range   PSA 2.17 0.10 - 4.00 ng/mL  Hemoglobin A1c  Result Value Ref Range   Hgb A1c MFr Bld 6.0 4.6 - 6.5 %    Comprehensive metabolic panel  Result Value Ref Range   Sodium 140 135 - 145 mEq/L   Potassium 4.1 3.5 - 5.1 mEq/L   Chloride 103 96 - 112 mEq/L   CO2 29 19 - 32 mEq/L   Glucose, Bld 120 (H) 70 - 99 mg/dL   BUN 15 6 - 23 mg/dL   Creatinine, Ser 0.87 0.40 - 1.50 mg/dL   Total Bilirubin 0.7 0.2 - 1.2 mg/dL   Alkaline Phosphatase 83 39 - 117 U/L   AST 12 0 - 37 U/L   ALT 20 0 - 53 U/L   Total Protein 6.9 6.0 - 8.3 g/dL   Albumin 4.2 3.5 - 5.2 g/dL   Calcium 9.7 8.4 - 10.5 mg/dL   GFR 93.70 >60.00 mL/min  Lipid panel  Result Value Ref Range   Cholesterol 143 0 - 200 mg/dL   Triglycerides 86.0 0.0 - 149.0 mg/dL   HDL 53.20 >39.00 mg/dL   VLDL 17.2 0.0 - 40.0 mg/dL   LDL Cholesterol 73 0 - 99 mg/dL   Total CHOL/HDL Ratio 3    NonHDL 90.03  Assessment & Plan:   Problem List Items Addressed This Visit    Bilateral carotid artery stenosis    No bruits appreciated today - overdue for f/u. Financial concerns - requests to defer this.      Relevant Medications   atorvastatin (LIPITOR) 40 MG tablet   valsartan-hydrochlorothiazide (DIOVAN HCT) 80-12.5 MG tablet   Diabetes mellitus type 2, controlled, without complications (HCC)    Chronic, stable. Continue current regimen. Foot exam today. Pneumovax today. Consider DSME. Encouraged he schedule eye exam as due.       Relevant Medications   metFORMIN (GLUCOPHAGE) 500 MG tablet   atorvastatin (LIPITOR) 40 MG tablet   valsartan-hydrochlorothiazide (DIOVAN HCT) 80-12.5 MG tablet   Health maintenance examination - Primary    Preventative protocols reviewed and updated unless pt declined. Discussed healthy diet and lifestyle.       Hyperlipemia    Chronic, stable. Continue statin. The 10-year ASCVD risk score Mikey Bussing DC Brooke Bonito., et al., 2013) is: 17.1%   Values used to calculate the score:     Age: 60 years     Sex: Male     Is Non-Hispanic African American: No     Diabetic: Yes     Tobacco smoker: No     Systolic Blood  Pressure: 120 mmHg     Is BP treated: Yes     HDL Cholesterol: 53.2 mg/dL     Total Cholesterol: 143 mg/dL       Relevant Medications   atorvastatin (LIPITOR) 40 MG tablet   valsartan-hydrochlorothiazide (DIOVAN HCT) 80-12.5 MG tablet   Hypertension    Chronic, stable. Continue current regimen.       Relevant Medications   atorvastatin (LIPITOR) 40 MG tablet   valsartan-hydrochlorothiazide (DIOVAN HCT) 80-12.5 MG tablet   Right sided sciatica    Anticipate R piriformis syndrome - provided with hoe exercises, rec aleve BID x 1 wk then PRN, update if not improved for PT referral.        Other Visit Diagnoses    Special screening for malignant neoplasms, colon       Relevant Orders   Ambulatory referral to Gastroenterology       Meds ordered this encounter  Medications  . metFORMIN (GLUCOPHAGE) 500 MG tablet    Sig: Take 1 tablet (500 mg total) by mouth daily with breakfast.    Dispense:  90 tablet    Refill:  3  . atorvastatin (LIPITOR) 40 MG tablet    Sig: Take 1 tablet (40 mg total) by mouth daily.    Dispense:  90 tablet    Refill:  3  . omeprazole (PRILOSEC) 20 MG capsule    Sig: TAKE 1 CAPSULE BY MOUTH EVERY DAY    Dispense:  90 capsule    Refill:  3  . valsartan-hydrochlorothiazide (DIOVAN HCT) 80-12.5 MG tablet    Sig: Take 1 tablet by mouth daily.    Dispense:  90 tablet    Refill:  1    In place of losartan hctz   Orders Placed This Encounter  Procedures  . Ambulatory referral to Gastroenterology    Referral Priority:   Routine    Referral Type:   Consultation    Referral Reason:   Specialty Services Required    Number of Visits Requested:   1    Follow up plan: Return in about 6 months (around 03/13/2018) for follow up visit.  [name], MD

## 2017-09-10 NOTE — Assessment & Plan Note (Signed)
Chronic, stable. Continue statin. The 10-year ASCVD risk score Mikey Bussing DC Brooke Bonito., et al., 2013) is: 17.1%   Values used to calculate the score:     Age: 67 years     Sex: Male     Is Non-Hispanic African American: No     Diabetic: Yes     Tobacco smoker: No     Systolic Blood Pressure: 235 mmHg     Is BP treated: Yes     HDL Cholesterol: 53.2 mg/dL     Total Cholesterol: 143 mg/dL

## 2017-09-10 NOTE — Patient Instructions (Addendum)
We will refer you for colonoscopy.  Pneumovax today If interested, check with pharmacy about new 2 shot shingles series (shingrix).  Schedule eye exam.  I think you have some right sciatica causing discomfort. I recommend you buy aleve or aleve PM over the counter and take 1 pill nightly (try in place of tylenol PM). Do this for a week. Do exercises provided today.  Good to see you today, call us with questions. Return as needed or in 6 months for follow up diabetes.   Health Maintenance, Male A healthy lifestyle and preventive care is important for your health and wellness. Ask your health care provider about what schedule of regular examinations is right for you. What should I know about weight and diet? Eat a Healthy Diet  Eat plenty of vegetables, fruits, whole grains, low-fat dairy products, and lean protein.  Do not eat a lot of foods high in solid fats, added sugars, or salt.  Maintain a Healthy Weight Regular exercise can help you achieve or maintain a healthy weight. You should:  Do at least 150 minutes of exercise each week. The exercise should increase your heart rate and make you sweat (moderate-intensity exercise).  Do strength-training exercises at least twice a week.  Watch Your Levels of Cholesterol and Blood Lipids  Have your blood tested for lipids and cholesterol every 5 years starting at 65 years of age. If you are at high risk for heart disease, you should start having your blood tested when you are 65 years old. You may need to have your cholesterol levels checked more often if: ? Your lipid or cholesterol levels are high. ? You are older than 65 years of age. ? You are at high risk for heart disease.  What should I know about cancer screening? Many types of cancers can be detected early and may often be prevented. Lung Cancer  You should be screened every year for lung cancer if: ? You are a current smoker who has smoked for at least 30 years. ? You are a  former smoker who has quit within the past 15 years.  Talk to your health care provider about your screening options, when you should start screening, and how often you should be screened.  Colorectal Cancer  Routine colorectal cancer screening usually begins at 65 years of age and should be repeated every 5-10 years until you are 65 years old. You may need to be screened more often if early forms of precancerous polyps or small growths are found. Your health care provider may recommend screening at an earlier age if you have risk factors for colon cancer.  Your health care provider may recommend using home test kits to check for hidden blood in the stool.  A small camera at the end of a tube can be used to examine your colon (sigmoidoscopy or colonoscopy). This checks for the earliest forms of colorectal cancer.  Prostate and Testicular Cancer  Depending on your age and overall health, your health care provider may do certain tests to screen for prostate and testicular cancer.  Talk to your health care provider about any symptoms or concerns you have about testicular or prostate cancer.  Skin Cancer  Check your skin from head to toe regularly.  Tell your health care provider about any new moles or changes in moles, especially if: ? There is a change in a mole's size, shape, or color. ? You have a mole that is larger than a pencil eraser.  Always  use sunscreen. Apply sunscreen liberally and repeat throughout the day.  Protect yourself by wearing long sleeves, pants, a wide-brimmed hat, and sunglasses when outside.  What should I know about heart disease, diabetes, and high blood pressure?  If you are 63-44 years of age, have your blood pressure checked every 3-5 years. If you are 42 years of age or older, have your blood pressure checked every year. You should have your blood pressure measured twice-once when you are at a hospital or clinic, and once when you are not at a hospital or  clinic. Record the average of the two measurements. To check your blood pressure when you are not at a hospital or clinic, you can use: ? An automated blood pressure machine at a pharmacy. ? A home blood pressure monitor.  Talk to your health care provider about your target blood pressure.  If you are between 74-98 years old, ask your health care provider if you should take aspirin to prevent heart disease.  Have regular diabetes screenings by checking your fasting blood sugar level. ? If you are at a normal weight and have a low risk for diabetes, have this test once every three years after the age of 73. ? If you are overweight and have a high risk for diabetes, consider being tested at a younger age or more often.  A one-time screening for abdominal aortic aneurysm (AAA) by ultrasound is recommended for men aged 41-75 years who are current or former smokers. What should I know about preventing infection? Hepatitis B If you have a higher risk for hepatitis B, you should be screened for this virus. Talk with your health care provider to find out if you are at risk for hepatitis B infection. Hepatitis C Blood testing is recommended for:  Everyone born from [birth year] through 1963.  Anyone with known risk factors for hepatitis C.  Sexually Transmitted Diseases (STDs)  You should be screened each year for STDs including gonorrhea and chlamydia if: ? You are sexually active and are younger than 65 years of age. ? You are older than 65 years of age and your health care provider tells you that you are at risk for this type of infection. ? Your sexual activity has changed since you were last screened and you are at an increased risk for chlamydia or gonorrhea. Ask your health care provider if you are at risk.  Talk with your health care provider about whether you are at high risk of being infected with HIV. Your health care provider may recommend a prescription medicine to help prevent HIV  infection.  What else can I do?  Schedule regular health, dental, and eye exams.  Stay current with your vaccines (immunizations).  Do not use any tobacco products, such as cigarettes, chewing tobacco, and e-cigarettes. If you need help quitting, ask your health care provider.  Limit alcohol intake to no more than 2 drinks per day. One drink equals 12 ounces of beer, 5 ounces of wine, or 1 ounces of hard liquor.  Do not use street drugs.  Do not share needles.  Ask your health care provider for help if you need support or information about quitting drugs.  Tell your health care provider if you often feel depressed.  Tell your health care provider if you have ever been abused or do not feel safe at home. This information is not intended to replace advice given to you by your health care provider. Make sure you discuss any questions  you have with your health care provider. Document Released: 10/12/2007 Document Revised: 12/13/2015 Document Reviewed: 01/17/2015 Elsevier Interactive Patient Education  [employer].

## 2017-09-10 NOTE — Assessment & Plan Note (Signed)
Preventative protocols reviewed and updated unless pt declined. Discussed healthy diet and lifestyle.  

## 2017-09-10 NOTE — Assessment & Plan Note (Signed)
Anticipate R piriformis syndrome - provided with hoe exercises, rec aleve BID x 1 wk then PRN, update if not improved for PT referral.

## 2017-09-10 NOTE — Assessment & Plan Note (Signed)
Chronic, stable. Continue current regimen. 

## 2017-09-10 NOTE — Assessment & Plan Note (Addendum)
Chronic, stable. Continue current regimen. Foot exam today. Pneumovax today. Consider DSME. Encouraged he schedule eye exam as due.

## 2017-09-10 NOTE — Assessment & Plan Note (Signed)
No bruits appreciated today - overdue for f/u. Financial concerns - requests to defer this.

## 2017-09-11 ENCOUNTER — Telehealth: Payer: Self-pay

## 2017-09-11 NOTE — Telephone Encounter (Signed)
Left message for patient to call Anastasiya back in regards to a referral-Anastasiya V Hopkins, RMA   

## 2017-09-24 DIAGNOSIS — Z23 Encounter for immunization: Secondary | ICD-10-CM | POA: Diagnosis not present

## 2017-09-24 NOTE — Addendum Note (Signed)
Addended by: Brenton Grills on: 09/09/6045 99:87 PM   Modules accepted: Orders

## 2017-11-26 ENCOUNTER — Other Ambulatory Visit: Payer: Self-pay | Admitting: Primary Care

## 2017-11-26 ENCOUNTER — Ambulatory Visit: Payer: BLUE CROSS/BLUE SHIELD | Admitting: Primary Care

## 2017-11-26 ENCOUNTER — Ambulatory Visit (INDEPENDENT_AMBULATORY_CARE_PROVIDER_SITE_OTHER)
Admission: RE | Admit: 2017-11-26 | Discharge: 2017-11-26 | Disposition: A | Discharge status: Home / Self Care | Payer: BLUE CROSS/BLUE SHIELD | Source: Ambulatory Visit | Attending: Primary Care | Admitting: Primary Care

## 2017-11-26 ENCOUNTER — Telehealth: Payer: Self-pay

## 2017-11-26 ENCOUNTER — Encounter: Payer: Self-pay | Admitting: Primary Care

## 2017-11-26 ENCOUNTER — Ambulatory Visit
Admission: RE | Admit: 2017-11-26 | Discharge: 2017-11-26 | Disposition: A | Discharge status: Home / Self Care | Payer: BLUE CROSS/BLUE SHIELD | Source: Ambulatory Visit | Attending: Primary Care | Admitting: Primary Care

## 2017-11-26 ENCOUNTER — Encounter

## 2017-11-26 VITALS — BP 130/66 | HR 68 | Temp 98.6°F | Ht 68.5 in | Wt 174.2 lb

## 2017-11-26 DIAGNOSIS — M79661 Pain in right lower leg: Secondary | ICD-10-CM

## 2017-11-26 DIAGNOSIS — M5431 Sciatica, right side: Secondary | ICD-10-CM

## 2017-11-26 DIAGNOSIS — M25551 Pain in right hip: Secondary | ICD-10-CM

## 2017-11-26 DIAGNOSIS — G8929 Other chronic pain: Secondary | ICD-10-CM | POA: Diagnosis not present

## 2017-11-26 DIAGNOSIS — R200 Anesthesia of skin: Secondary | ICD-10-CM

## 2017-11-26 DIAGNOSIS — R202 Paresthesia of skin: Secondary | ICD-10-CM

## 2017-11-26 MED ORDER — PREDNISONE 20 MG PO TABS: ORAL_TABLET | ORAL | 0 refills | Status: DC

## 2017-11-26 NOTE — Assessment & Plan Note (Signed)
Suspect osteoarthritis. Check plain films today. Dicussed stretching, Aleve PRN (hold for now with prednisone).

## 2017-11-26 NOTE — Progress Notes (Signed)
Subjective:    Patient ID: Mark Davidson, male    DOB: 01-Apr-1951, 67 y.o.   MRN: [id number]  HPI  Mr. Adan is a 67 year old male with a history of type 2 diabetes, sciatica, left fibular fracture, hypertension, carotid artery stenosis who presents today with a chief complaint of extremity pain.  His pain is located to the right lateral and posterior hip that has been present intermittently since May 2019. Symptoms are worse in the morning when he first wakes then improves with movement during the day. He's take Aleve with some improvement. He does have some numbness/tingling from right hip to mid anterior thigh.   His main concern today is posterior/lateral calf pain which is distal to knee and proximal ankle. This is constant, slightly improved with movement. He also reports numbness/tingling to right plantar foot with difficulty with ambulation. This has been noticeable since Friday last week. 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 He denies redness/swelling.   He is compliant to his Metformin daily. Last A1C in May 2019 of 6.0. He is a non smoker. He is also compliant to his atorvastatin 40 mg.   BP Readings from Last 3 Encounters:  11/26/17 130/66  09/10/17 120/68  07/21/17 (!) 172/76     Review of Systems  Constitutional: Negative for fever.  Musculoskeletal: Positive for arthralgias. Negative for joint swelling.       Right calf pain  Skin: Negative for color change.  Neurological: Positive for numbness.       Past Medical History:  Diagnosis Date  . Arthritis   . Carotid stenosis   . Cholecystitis, acute 07/06/2017  . Colon polyp   . Diabetes mellitus type 2, uncontrolled, without complications ([place])   . ED (erectile dysfunction)   . Hyperlipemia   . Hypertension      Social History   Socioeconomic History  . Marital status: Married    Spouse name: Not on file  . Number of children: 0  . Years of education: Not on file  .  Highest education level: Not on file  Occupational History  . Occupation: Truck Education administrator: OTHER    Comment: [employer]  . Financial resource strain: Not on file  . Food insecurity:    Worry: Not on file    Inability: Not on file  . Transportation needs:    Medical: Not on file    Non-medical: Not on file  Tobacco Use  . Smoking status: Never Smoker  . Smokeless tobacco: Current User    Types: Snuff  Substance and Sexual Activity  . Alcohol use: No  . Drug use: No  . Sexual activity: Not on file  Lifestyle  . Physical activity:    Days per week: Not on file    Minutes per session: Not on file  . Stress: Not on file  Relationships  . Social connections:    Talks on phone: Not on file    Gets together: Not on file    Attends religious service: Not on file    Active member of club or organization: Not on file    Attends meetings of clubs or organizations: Not on file    Relationship status: Not on file  . Intimate partner violence:    Fear of current or ex partner: Not on file    Emotionally abused: Not on file    Physically abused: Not on file  Forced sexual activity: Not on file  Other Topics Concern  . Not on file  Social History Narrative   Caffeine: 4-5 sodas/day, 1 cup coffee/day   Lives with wife, 1 dog.  No children   Occupation: Administrator at [employer]: [employer]   Activity: stays active, mowing grass, walking and lifting weights.   Diet: poor fruits and vegetables, good water intake.  Lots of red meat    Past Surgical History:  Procedure Laterality Date  . CARDIAC CATHETERIZATION  2009   normal per pt  . carotid US  03/2012   bilateral 40-59% stenosis, rec rpt 1 yr  . CHOLECYSTECTOMY N/A 07/06/2017   path showed acute and chronic cholecystitis - [place], [place], [place]  . TONSILLECTOMY AND ADENOIDECTOMY      Family History  Problem Relation Age of Onset  . Rheum arthritis Father   . Hyperlipidemia Father   . Heart disease  Father   . Stroke Father   . Diabetes Father   . Arthritis Father   . Coronary artery disease Father   . Breast cancer Mother   . Cancer Mother        breast  . Heart disease Brother   . Coronary artery disease Brother   . Hyperlipidemia Brother   . Diabetes Brother     Allergies  Allergen Reactions  . Ace Inhibitors Cough    Current Outpatient Medications on File Prior to Visit  Medication Sig Dispense Refill  . atorvastatin (LIPITOR) 40 MG tablet Take 1 tablet (40 mg total) by mouth daily. 90 tablet 3  . diphenhydramine-acetaminophen (TYLENOL PM) 25-500 MG TABS Take 1 tablet by mouth at bedtime.     . metFORMIN (GLUCOPHAGE) 500 MG tablet Take 1 tablet (500 mg total) by mouth daily with breakfast. 90 tablet 3  . omeprazole (PRILOSEC) 20 MG capsule TAKE 1 CAPSULE BY MOUTH EVERY DAY 90 capsule 3  . valsartan-hydrochlorothiazide (DIOVAN HCT) 80-12.5 MG tablet Take 1 tablet by mouth daily. 90 tablet 1   No current facility-administered medications on file prior to visit.     BP 130/66   Pulse 68   Temp 98.6 F (37 C) (Oral)   Ht 5' 8.5" (1.74 m)   Wt 174 lb 4 oz (79 kg)   SpO2 98%   BMI 26.11 kg/m    Objective:   Physical Exam  Cardiovascular:  Pulses:      Dorsalis pedis pulses are 2+ on the right side.       Posterior tibial pulses are 2+ on the right side.  Musculoskeletal:       Right hip: He exhibits normal strength and no bony tenderness.       Lumbar back: He exhibits normal range of motion, no tenderness and no pain.       Legs: Mild decrease in ROM with flexion   Skin: Skin is warm and dry. No erythema.  Right calf measuring 12.5 cm left calf measuring 12 cm           Assessment & Plan:

## 2017-11-26 NOTE — Addendum Note (Signed)
Addended by: Lurlean Nanny on: 11/26/2017 10:05 AM   Modules accepted: Orders

## 2017-11-26 NOTE — Addendum Note (Signed)
Addended by: Lurlean Nanny on: 11/26/2017 10:09 AM   Modules accepted: Orders

## 2017-11-26 NOTE — Assessment & Plan Note (Addendum)
No obvious DVT, but given severity of pain and presentation will check Venous dopplers to rule out. Also check plain films of lower extremity given history of fibular fracture to left side without prior trauma. Doesn't fit picture of claudication.

## 2017-11-26 NOTE — Telephone Encounter (Signed)
Call Report for Venous U/S: Negative for DVT. Pt is already aware was sent home.

## 2017-11-26 NOTE — Assessment & Plan Note (Signed)
From right hip to anterior mid thigh, also with sensation to calf and plantar foot. No improvement with Aleve so will treat with prednisone course.

## 2017-11-26 NOTE — Addendum Note (Signed)
Addended by: Lurlean Nanny on: 11/26/2017 10:10 AM   Modules accepted: Orders

## 2017-11-26 NOTE — Patient Instructions (Signed)
Complete xray(s) prior to leaving today. I will notify you of your results once received.  Start prednisone tablets for numbness/tingling. Take 2 tablets daily for 4 days then 1 tablet daily for 4 days.  Stop by the front desk and speak with either [facility] regarding your ultrasound.   It was a pleasure meeting you!

## 2017-11-26 NOTE — Telephone Encounter (Signed)
Spoken and notified patient of Kate Clark's comments. Patient verbalized understanding.  

## 2017-11-26 NOTE — Telephone Encounter (Signed)
Noted. Please have patient try the prednisone, elevate,ice, rest and update [place] if no improvement.  Likely MSK related.  May need to consider ABI's, B12. BMP from May 2019 unremarkable.

## 2017-11-28 ENCOUNTER — Encounter: Payer: Self-pay | Admitting: Primary Care

## 2017-12-01 ENCOUNTER — Encounter: Payer: Self-pay | Admitting: Family Medicine

## 2017-12-08 ENCOUNTER — Encounter: Payer: Self-pay | Admitting: Family Medicine

## 2017-12-08 ENCOUNTER — Ambulatory Visit: Payer: BLUE CROSS/BLUE SHIELD | Admitting: Family Medicine

## 2017-12-08 VITALS — BP 118/62 | HR 74 | Temp 97.9°F | Ht 68.5 in | Wt 176.5 lb

## 2017-12-08 DIAGNOSIS — R059 Cough, unspecified: Secondary | ICD-10-CM

## 2017-12-08 DIAGNOSIS — R05 Cough: Secondary | ICD-10-CM

## 2017-12-08 DIAGNOSIS — M5431 Sciatica, right side: Secondary | ICD-10-CM

## 2017-12-08 MED ORDER — FLUTICASONE PROPIONATE 50 MCG/ACT NA SUSP: 2.0000 | Freq: Every day | NASAL | 6 refills | Status: DC

## 2017-12-08 MED ORDER — GUAIFENESIN-CODEINE 100-10 MG/5ML PO SYRP: 5.0000 mL | ORAL_SOLUTION | Freq: Two times a day (BID) | ORAL | 0 refills | Status: DC | PRN

## 2017-12-08 NOTE — Patient Instructions (Addendum)
I think you have persistent sinus congestion leading to cough. Treat with continued allegra, add flonase nasal steroid spray, add cheratussin cough syrup for night time.  If fever >101, worsening productive cough, or worsening facial sinus pain let us know for antibiotic.

## 2017-12-08 NOTE — Assessment & Plan Note (Signed)
New over last 2 weeks associated with sinus congestion, however no fevers, no unilateral facial pain symptoms - I don't see obvious bacterial cause at this time. Recommended flonase, cheratussin for night time cough suppression, rest, fluids, continued allegra, and supportive care.  Update if not improving with treatment to consider abx course to cover developing sinusitis given duration of symptoms.  Pt wonders if he is reacting to something in recent paint he used. H/o similar coughing episode when he last used this paint.

## 2017-12-08 NOTE — Assessment & Plan Note (Signed)
Overall improved. Consider further evaluation if recurrent symptoms.

## 2017-12-08 NOTE — Progress Notes (Signed)
BP 118/62   Pulse 74   Temp 97.9 F (36.6 C) (Oral)   Ht 5' 8.5" (1.74 m)   Wt 176 lb 8 oz (80.1 kg)   SpO2 97%   BMI 26.45 kg/m    CC: R calf pain, sinus congestion Subjective:    Patient ID: Mark Davidson, male    DOB: 09/10/1950, 67 y.o.   MRN: [id number]  HPI: Amahd Davidson is a 67 y.o. male presenting on 12/08/2017 for Leg Pain (Here for right leg pain f/u. States pain is better. ) and Sinus Problem (C/o cough, watery eyes, runny nose and sinus pressure. Started last week. Tried Allegra. )   Seen 2 wks ago by NP Anda Kraft with R calf pain - hip and leg xrays were normal, LE [place] returned negative for DVT. Treated with prednisone course - with resolution of pain. ?sciatica. He notes persistent R leg pain worse with prolonged walking. Also endorses some paresthesias and numbness of right leg and sole of foot.   2 wk h/o rhinorrhea, watery eyes, progressing to dry cough. Also notes sore throat with PNDrainage. Ongoing sinus pressure. Chest is clear.  Treating with allegra with benefit. Has also tried robitussin without benefit.  No fevers/chills, ear or tooth pain, dyspnea or wheezing.  No h/o allergic rhinitis.  He has been painting recently, wonders if this triggered symptoms.   Relevant past medical, surgical, family and social history reviewed and updated as indicated. Interim medical history since our last visit reviewed. Allergies and medications reviewed and updated. Outpatient Medications Prior to Visit  Medication Sig Dispense Refill  . atorvastatin (LIPITOR) 40 MG tablet Take 1 tablet (40 mg total) by mouth daily. 90 tablet 3  . diphenhydramine-acetaminophen (TYLENOL PM) 25-500 MG TABS Take 1 tablet by mouth at bedtime.     . metFORMIN (GLUCOPHAGE) 500 MG tablet Take 1 tablet (500 mg total) by mouth daily with breakfast. 90 tablet 3  . omeprazole (PRILOSEC) 20 MG capsule TAKE 1 CAPSULE BY MOUTH EVERY DAY 90 capsule 3  . valsartan-hydrochlorothiazide (DIOVAN HCT)  80-12.5 MG tablet Take 1 tablet by mouth daily. 90 tablet 1  . predniSONE (DELTASONE) 20 MG tablet Take 2 tablets for 4 days, then 1 tablet for 4 days. 12 tablet 0   No facility-administered medications prior to visit.      Per HPI unless specifically indicated in ROS section below Review of Systems     Objective:    BP 118/62   Pulse 74   Temp 97.9 F (36.6 C) (Oral)   Ht 5' 8.5" (1.74 m)   Wt 176 lb 8 oz (80.1 kg)   SpO2 97%   BMI 26.45 kg/m   Wt Readings from Last 3 Encounters:  12/08/17 176 lb 8 oz (80.1 kg)  11/26/17 174 lb 4 oz (79 kg)  09/10/17 172 lb (78 kg)    Physical Exam  Constitutional: He appears well-developed and well-nourished. No distress.  HENT:  Head: Normocephalic and atraumatic.  Right Ear: Hearing, tympanic membrane, external ear and ear canal normal.  Left Ear: Hearing, tympanic membrane, external ear and ear canal normal.  Nose: Rhinorrhea present. No mucosal edema. Right sinus exhibits no maxillary sinus tenderness and no frontal sinus tenderness. Left sinus exhibits no maxillary sinus tenderness and no frontal sinus tenderness.  Mouth/Throat: Uvula is midline and mucous membranes are normal. Posterior oropharyngeal erythema present. No oropharyngeal exudate, posterior oropharyngeal edema or tonsillar abscesses.  Eyes: Pupils are equal, round, and reactive to  light. Conjunctivae and EOM are normal. No scleral icterus.  Neck: Normal range of motion. Neck supple.  Cardiovascular: Normal rate, regular rhythm, normal heart sounds and intact distal pulses.  No murmur heard. Pulmonary/Chest: Effort normal and breath sounds normal. No respiratory distress. He has no wheezes. He has no rales.  Lungs clear  Lymphadenopathy:    He has no cervical adenopathy.  Skin: Skin is warm and dry. No rash noted.  Nursing note and vitals reviewed.     Assessment & Plan:   Problem List Items Addressed This Visit    Right sided sciatica    Overall improved.  Consider further evaluation if recurrent symptoms.       Cough - Primary    New over last 2 weeks associated with sinus congestion, however no fevers, no unilateral facial pain symptoms - I don't see obvious bacterial cause at this time. Recommended flonase, cheratussin for night time cough suppression, rest, fluids, continued allegra, and supportive care.  Update if not improving with treatment to consider abx course to cover developing sinusitis given duration of symptoms.  Pt wonders if he is reacting to something in recent paint he used. H/o similar coughing episode when he last used this paint.          Meds ordered this encounter  Medications  . fluticasone (FLONASE) 50 MCG/ACT nasal spray    Sig: Place 2 sprays into both nostrils daily.    Dispense:  16 g    Refill:  6  . guaiFENesin-codeine (CHERATUSSIN AC) 100-10 MG/5ML syrup    Sig: Take 5 mLs by mouth 2 (two) times daily as needed.    Dispense:  120 mL    Refill:  0   No orders of the defined types were placed in this encounter.   Follow up plan: Return if symptoms worsen or fail to improve.  [name], MD

## 2017-12-10 ENCOUNTER — Encounter: Payer: Self-pay | Admitting: Family Medicine

## 2017-12-15 MED ORDER — AMOXICILLIN-POT CLAVULANATE 875-125 MG PO TABS: 1.0000 | ORAL_TABLET | Freq: Two times a day (BID) | ORAL | 0 refills | Status: AC

## 2017-12-15 NOTE — Addendum Note (Signed)
Addended by: [name] on: 12/15/2017 11:38 PM   Modules accepted: Orders

## 2018-01-09 ENCOUNTER — Encounter: Payer: Self-pay | Admitting: Family Medicine

## 2018-01-19 DIAGNOSIS — R69 Illness, unspecified: Secondary | ICD-10-CM | POA: Diagnosis not present

## 2018-04-30 ENCOUNTER — Other Ambulatory Visit: Payer: Self-pay | Admitting: Family Medicine

## 2018-07-27 ENCOUNTER — Encounter: Payer: Self-pay | Admitting: Family Medicine

## 2018-07-28 NOTE — Telephone Encounter (Signed)
plz schedule webex visit.

## 2018-07-29 ENCOUNTER — Telehealth: Payer: Self-pay | Admitting: Family Medicine

## 2018-07-29 ENCOUNTER — Ambulatory Visit (INDEPENDENT_AMBULATORY_CARE_PROVIDER_SITE_OTHER): Payer: Medicare Other | Admitting: Family Medicine

## 2018-07-29 ENCOUNTER — Encounter: Payer: Self-pay | Admitting: Family Medicine

## 2018-07-29 ENCOUNTER — Other Ambulatory Visit: Payer: Self-pay

## 2018-07-29 VITALS — Ht 68.5 in

## 2018-07-29 DIAGNOSIS — R51 Headache: Secondary | ICD-10-CM | POA: Diagnosis not present

## 2018-07-29 DIAGNOSIS — J309 Allergic rhinitis, unspecified: Secondary | ICD-10-CM | POA: Insufficient documentation

## 2018-07-29 DIAGNOSIS — R519 Headache, unspecified: Secondary | ICD-10-CM

## 2018-07-29 DIAGNOSIS — J301 Allergic rhinitis due to pollen: Secondary | ICD-10-CM

## 2018-07-29 MED ORDER — PREDNISONE 10 MG PO TABS: ORAL_TABLET | ORAL | 0 refills | Status: DC

## 2018-07-29 MED ORDER — FLUTICASONE PROPIONATE 50 MCG/ACT NA SUSP: 2.0000 | Freq: Every day | NASAL | 6 refills | Status: DC

## 2018-07-29 NOTE — Telephone Encounter (Signed)
Spoke with pt scheduling Webex visit today at 10:00 AM. Instructed pt to download [employer] app.

## 2018-07-29 NOTE — Telephone Encounter (Signed)
Sent meds in to [facility].

## 2018-07-29 NOTE — Telephone Encounter (Signed)
Spoke with him today

## 2018-07-29 NOTE — Progress Notes (Signed)
Virtual visit attempted through WebEx. Pt unable to navigate app so visit converted to Doxy.Me, but pt was unable to connect to this either. See below.  Patient location: home Provider location: [place] at [facility], office If any vitals were documented below, they were collected by patient at home.    Interactive audio and video telecommunications were attempted between myself and [name], however failed due to patient having technical difficulties. We continued and completed visit with audio only.  Time: 10:01 am - 10:18 am   Ht 5' 8.5" (1.74 m)   BMI 26.45 kg/m    CC: sinus problem Subjective:    Patient ID: Mark Davidson, male    DOB: [birth year], 68 y.o.   MRN: [id number]  HPI: Mark Davidson is a 68 y.o. male presenting on 07/29/2018 for Sinus Problem (C/o dull HA, postnasal drip, cough and nasal congestion. Tried Allegra and Flonase, helpful. )   2-3 wk h/o dull headache in center of forehead. Also with PNDrainage, mildly productive cough in am, nasal congestion. Blowing nose with clear thin mucous.   No fevers/chills, chest congestion, wheezing or dyspnea.   Has been treating with flonase and allegra OTC for the past week.   He has been doing outdoor painting.  Ho seasonal allergies.      Relevant past medical, surgical, family and social history reviewed and updated as indicated. Interim medical history since our last visit reviewed. Allergies and medications reviewed and updated. Outpatient Medications Prior to Visit  Medication Sig Dispense Refill  . atorvastatin (LIPITOR) 40 MG tablet Take 1 tablet (40 mg total) by mouth daily. 90 tablet 3  . diphenhydramine-acetaminophen (TYLENOL PM) 25-500 MG TABS Take 1 tablet by mouth at bedtime.     . metFORMIN (GLUCOPHAGE) 500 MG tablet Take 1 tablet (500 mg total) by mouth daily with breakfast. 90 tablet 3  . omeprazole (PRILOSEC) 20 MG capsule TAKE 1 CAPSULE BY MOUTH EVERY DAY 90 capsule 3  .  valsartan-hydrochlorothiazide (DIOVAN-HCT) 80-12.5 MG tablet TAKE 1 TABLET BY MOUTH ONCE A DAY 90 tablet 1  . fluticasone (FLONASE) 50 MCG/ACT nasal spray Place 2 sprays into both nostrils daily. 16 g 6  . guaiFENesin-codeine (CHERATUSSIN AC) 100-10 MG/5ML syrup Take 5 mLs by mouth 2 (two) times daily as needed. 120 mL 0   No facility-administered medications prior to visit.      Per HPI unless specifically indicated in ROS section below Review of Systems Objective:    Ht 5' 8.5" (1.74 m)   BMI 26.45 kg/m   Wt Readings from Last 3 Encounters:  12/08/17 176 lb 8 oz (80.1 kg)  11/26/17 174 lb 4 oz (79 kg)  09/10/17 172 lb (78 kg)    Physical Exam Vitals signs and nursing note reviewed.  Pulmonary:     Comments: Speaks in complete sentences, no increased work of breathing. Neurological:     Mental Status: He is alert.  Psychiatric:        Mood and Affect: Mood normal.        Thought Content: Thought content normal.       Results for orders placed or performed in visit on 09/08/17  PSA  Result Value Ref Range   PSA 2.17 0.10 - 4.00 ng/mL  Hemoglobin A1c  Result Value Ref Range   Hgb A1c MFr Bld 6.0 4.6 - 6.5 %  Comprehensive metabolic panel  Result Value Ref Range   Sodium 140 135 - 145 mEq/L   Potassium  4.1 3.5 - 5.1 mEq/L   Chloride 103 96 - 112 mEq/L   CO2 29 19 - 32 mEq/L   Glucose, Bld 120 (H) 70 - 99 mg/dL   BUN 15 6 - 23 mg/dL   Creatinine, Ser 0.87 0.40 - 1.50 mg/dL   Total Bilirubin 0.7 0.2 - 1.2 mg/dL   Alkaline Phosphatase 83 39 - 117 U/L   AST 12 0 - 37 U/L   ALT 20 0 - 53 U/L   Total Protein 6.9 6.0 - 8.3 g/dL   Albumin 4.2 3.5 - 5.2 g/dL   Calcium 9.7 8.4 - 10.5 mg/dL   GFR 93.70 >60.00 mL/min  Lipid panel  Result Value Ref Range   Cholesterol 143 0 - 200 mg/dL   Triglycerides 86.0 0.0 - 149.0 mg/dL   HDL 53.20 >39.00 mg/dL   VLDL 17.2 0.0 - 40.0 mg/dL   LDL Cholesterol 73 0 - 99 mg/dL   Total CHOL/HDL Ratio 3    NonHDL 90.03    Assessment &  Plan:   Problem List Items Addressed This Visit    Sinus headache - Primary    Dull pressure headache better since starting flonase. No symptoms of bacterial infection at this time. Anticipate exacerbation due to allergies. Supportive care reviewed. Continue flonase, allegra, start nasal saline irrigation. Reviewed allergen avoidance measures. Rx for low dose prednisone taper sent to pharmacy with indications when to fill. Red flags to notify us for abx course reviewed as well - fever, worsening unilateral facial pain, worsening productive cough or purulent nasal discharge. Pt agrees with plan.       Allergic rhinitis       Meds ordered this encounter  Medications  . fluticasone (FLONASE) 50 MCG/ACT nasal spray    Sig: Place 2 sprays into both nostrils daily.    Dispense:  16 g    Refill:  6  . predniSONE (DELTASONE) 10 MG tablet    Sig: Take three tablets for 3 days followed by two tablets for 3 days followed by one tablet for 3 days    Dispense:  18 tablet    Refill:  0   No orders of the defined types were placed in this encounter.   Follow up plan: No follow-ups on file.  [name], MD

## 2018-07-29 NOTE — Assessment & Plan Note (Signed)
Dull pressure headache better since starting flonase. No symptoms of bacterial infection at this time. Anticipate exacerbation due to allergies. Supportive care reviewed. Continue flonase, allegra, start nasal saline irrigation. Reviewed allergen avoidance measures. Rx for low dose prednisone taper sent to pharmacy with indications when to fill. Red flags to notify us for abx course reviewed as well - fever, worsening unilateral facial pain, worsening productive cough or purulent nasal discharge. Pt agrees with plan.

## 2018-07-29 NOTE — Telephone Encounter (Signed)
Pt stated he uses [facility]

## 2018-07-29 NOTE — Telephone Encounter (Signed)
Noted  

## 2018-09-01 ENCOUNTER — Other Ambulatory Visit: Payer: Self-pay | Admitting: Family Medicine

## 2018-09-29 ENCOUNTER — Telehealth: Payer: Self-pay

## 2018-09-29 NOTE — Telephone Encounter (Signed)
Pt was called and left a message b/c his appointment was 4pm on 09/30/18 and we close at that time. Pt called 09/29/18 and he was rescheduled for an In office visit on 10/02/18 @ 9am.

## 2018-09-29 NOTE — Telephone Encounter (Signed)
Patient called on call last night stating that he was returning a call.  I do not see anywhere in the chart where we reached out other than it appears we cancelled an appointment for him for tomorrow.  Will forward this to CMA and to front office staff to see if anyone knows who contacted patient.

## 2018-09-30 ENCOUNTER — Encounter: Payer: Medicare Other | Admitting: Family Medicine

## 2018-10-02 ENCOUNTER — Ambulatory Visit (INDEPENDENT_AMBULATORY_CARE_PROVIDER_SITE_OTHER): Payer: Medicare Other | Admitting: Family Medicine

## 2018-10-02 ENCOUNTER — Telehealth: Payer: Self-pay

## 2018-10-02 ENCOUNTER — Encounter: Payer: Self-pay | Admitting: Family Medicine

## 2018-10-02 VITALS — BP 158/70 | HR 75 | Temp 97.9°F | Ht 69.25 in | Wt 185.2 lb

## 2018-10-02 DIAGNOSIS — Z125 Encounter for screening for malignant neoplasm of prostate: Secondary | ICD-10-CM

## 2018-10-02 DIAGNOSIS — Z0000 Encounter for general adult medical examination without abnormal findings: Secondary | ICD-10-CM

## 2018-10-02 DIAGNOSIS — Z7189 Other specified counseling: Secondary | ICD-10-CM | POA: Diagnosis not present

## 2018-10-02 DIAGNOSIS — M25512 Pain in left shoulder: Secondary | ICD-10-CM | POA: Diagnosis not present

## 2018-10-02 DIAGNOSIS — Z23 Encounter for immunization: Secondary | ICD-10-CM | POA: Diagnosis not present

## 2018-10-02 DIAGNOSIS — Z720 Tobacco use: Secondary | ICD-10-CM

## 2018-10-02 DIAGNOSIS — I6523 Occlusion and stenosis of bilateral carotid arteries: Secondary | ICD-10-CM

## 2018-10-02 DIAGNOSIS — E119 Type 2 diabetes mellitus without complications: Secondary | ICD-10-CM | POA: Diagnosis not present

## 2018-10-02 DIAGNOSIS — E785 Hyperlipidemia, unspecified: Secondary | ICD-10-CM | POA: Diagnosis not present

## 2018-10-02 DIAGNOSIS — Z1211 Encounter for screening for malignant neoplasm of colon: Secondary | ICD-10-CM | POA: Diagnosis not present

## 2018-10-02 DIAGNOSIS — I10 Essential (primary) hypertension: Secondary | ICD-10-CM

## 2018-10-02 DIAGNOSIS — R0789 Other chest pain: Secondary | ICD-10-CM | POA: Insufficient documentation

## 2018-10-02 DIAGNOSIS — K635 Polyp of colon: Secondary | ICD-10-CM

## 2018-10-02 LAB — COMPREHENSIVE METABOLIC PANEL
ALT: 44 U/L (ref 0–53)
AST: 23 U/L (ref 0–37)
Albumin: 4.4 g/dL (ref 3.5–5.2)
Alkaline Phosphatase: 94 U/L (ref 39–117)
BUN: 14 mg/dL (ref 6–23)
CO2: 29 mEq/L (ref 19–32)
Calcium: 9.9 mg/dL (ref 8.4–10.5)
Chloride: 101 mEq/L (ref 96–112)
Creatinine, Ser: 1.03 mg/dL (ref 0.40–1.50)
GFR: 72.31 mL/min (ref >60.00)
Glucose, Bld: 141 mg/dL — ABNORMAL HIGH (ref 70–99)
Potassium: 4.2 mEq/L (ref 3.5–5.1)
Sodium: 139 mEq/L (ref 135–145)
Total Bilirubin: 0.6 mg/dL (ref 0.2–1.2)
Total Protein: 7 g/dL (ref 6.0–8.3)

## 2018-10-02 LAB — LIPID PANEL
Cholesterol: 160 mg/dL (ref 0–200)
HDL: 44.2 mg/dL (ref >39.00)
LDL Cholesterol: 86 mg/dL (ref 0–99)
NonHDL: 116.07
Total CHOL/HDL Ratio: 4
Triglycerides: 150 mg/dL — ABNORMAL HIGH (ref 0.0–149.0)
VLDL: 30 mg/dL (ref 0.0–40.0)

## 2018-10-02 LAB — PSA: PSA: 1.01 ng/mL (ref 0.10–4.00)

## 2018-10-02 LAB — HEMOGLOBIN A1C: Hgb A1c MFr Bld: 7.7 % — ABNORMAL HIGH (ref 4.6–6.5)

## 2018-10-02 MED ORDER — VALSARTAN-HYDROCHLOROTHIAZIDE 160-12.5 MG PO TABS: 1.0000 | ORAL_TABLET | Freq: Every day | ORAL | 1 refills | Status: DC

## 2018-10-02 MED ORDER — LOSARTAN POTASSIUM-HCTZ 100-12.5 MG PO TABS: 1.0000 | ORAL_TABLET | Freq: Every day | ORAL | 1 refills | Status: DC

## 2018-10-02 MED ORDER — OMEPRAZOLE 20 MG PO CPDR: DELAYED_RELEASE_CAPSULE | ORAL | 3 refills | Status: DC

## 2018-10-02 MED ORDER — METFORMIN HCL 500 MG PO TABS: 500.0000 mg | ORAL_TABLET | Freq: Every day | ORAL | 3 refills | Status: DC

## 2018-10-02 MED ORDER — ATORVASTATIN CALCIUM 40 MG PO TABS: 40.0000 mg | ORAL_TABLET | Freq: Every day | ORAL | 3 refills | Status: DC

## 2018-10-02 NOTE — Progress Notes (Signed)
This visit was conducted in person.  BP (!) 158/70 (BP Location: Right Arm, Patient Position: Sitting, Cuff Size: Normal)   Pulse 75   Temp 97.9 F (36.6 C) (Oral)   Ht 5' 9.25" (1.759 m)   Wt 185 lb 4 oz (84 kg)   SpO2 98%   BMI 27.16 kg/m   BP 180/88 on repeat  CC: welcome to medicare visit Subjective:    Patient ID: Mark Davidson, male    DOB: January 27, 1951, 68 y.o.   MRN: [id number]  HPI: Mark Davidson is a 67 y.o. male presenting on 10/02/2018 for Welcome to Medicare   Fully retired.   Screening:  Passes depression, fall screens.   Hearing Screening   125Hz  250Hz  500Hz  1000Hz  2000Hz  3000Hz  4000Hz  6000Hz  8000Hz   Right ear:   20 25 40  0    Left ear:   20 25 20   40      Visual Acuity Screening   Right eye Left eye Both eyes  Without correction:     With correction: 20/20 20/20 20/15    BP elevated today - compliant with diovan hct 80/12.5mg  daily. Has tried allegra for sinuses as well as ibuprofen/naproxen. Has been eating more salty foods (bacon, bbq). Drinking 2-3 regular pepsi/day.   Preventative: Colon cancer screening - has not completed stool kits - only has BM at work. Endorses remote colonoscopy with polyps. No records available. Will refer for repeat colonoscopy - did not complete last year. Agrees to iFOB this year. Prostate cancer screening - discussed. Requests checking today.  No fmhx AAA.  Flu shot yearly  Tetanus - 2012  Pneumovax 08/2017, prevnar today  Shingrix - interested  Advanced planning - does not have at home. Would want wife to be HCPOA. Packet provided today.  Seat belt use discussed Sunscreen use discussed. No changing moles on skin Non smoker, he does dip  Alcohol - 1 shot of whiskey nightly (mixed drink)  Eye exam - upcoming next week  Dentist - recently saw, planning to have teeth pulled  Bowels - no constipation Bladder - no incontinence  Caffeine: 4-5 sodas/day, 1 cup coffee/day Lives with wife, 1 dog. 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 Diet: poor fruits and vegetables, good water intake. Lots of red meat     Relevant past medical, surgical, family and social history reviewed and updated as indicated. Interim medical history since our last visit reviewed. Allergies and medications reviewed and updated. Outpatient Medications Prior to Visit  Medication Sig Dispense Refill  . diphenhydramine-acetaminophen (TYLENOL PM) 25-500 MG TABS Take 1 tablet by mouth at bedtime.     . fluticasone (FLONASE) 50 MCG/ACT nasal spray Place 2 sprays into both nostrils daily. 16 g 6  . atorvastatin (LIPITOR) 40 MG tablet Take 1 tablet (40 mg total) by mouth daily. 90 tablet 3  . metFORMIN (GLUCOPHAGE) 500 MG tablet Take 1 tablet (500 mg total) by mouth daily with breakfast. 90 tablet 3  . omeprazole (PRILOSEC) 20 MG capsule TAKE 1 CAPSULE BY MOUTH ONCE DAILY 90 capsule 0  . valsartan-hydrochlorothiazide (DIOVAN-HCT) 80-12.5 MG tablet TAKE 1 TABLET BY MOUTH ONCE A DAY 90 tablet 1  . predniSONE (DELTASONE) 10 MG tablet Take three tablets for 3 days followed by two tablets for 3 days followed by one tablet for 3 days 18 tablet 0   No facility-administered medications prior to visit.      Per  HPI unless specifically indicated in ROS section below Review of Systems Objective:    BP (!) 158/70 (BP Location: Right Arm, Patient Position: Sitting, Cuff Size: Normal)   Pulse 75   Temp 97.9 F (36.6 C) (Oral)   Ht 5' 9.25" (1.759 m)   Wt 185 lb 4 oz (84 kg)   SpO2 98%   BMI 27.16 kg/m   Wt Readings from Last 3 Encounters:  10/02/18 185 lb 4 oz (84 kg)  12/08/17 176 lb 8 oz (80.1 kg)  11/26/17 174 lb 4 oz (79 kg)    Physical Exam Vitals signs and nursing note reviewed.  Constitutional:      General: He is not in acute distress.    Appearance: He is well-developed.  HENT:     Head: Normocephalic and atraumatic.     Right  Ear: Hearing, tympanic membrane, ear canal and external ear normal.     Left Ear: Hearing, tympanic membrane, ear canal and external ear normal.     Nose: Nose normal.     Mouth/Throat:     Mouth: Mucous membranes are moist.     Pharynx: Uvula midline. No oropharyngeal exudate or posterior oropharyngeal erythema.  Eyes:     General: No scleral icterus.    Conjunctiva/sclera: Conjunctivae normal.     Pupils: Pupils are equal, round, and reactive to light.  Neck:     Musculoskeletal: Normal range of motion and neck supple.     Vascular: No carotid bruit.  Cardiovascular:     Rate and Rhythm: Normal rate and regular rhythm.     Pulses: Normal pulses.          Radial pulses are 2+ on the right side and 2+ on the left side.     Heart sounds: Normal heart sounds. No murmur.  Pulmonary:     Effort: Pulmonary effort is normal. No respiratory distress.     Breath sounds: Normal breath sounds. No wheezing, rhonchi or rales.  Abdominal:     General: Bowel sounds are normal. There is no distension.     Palpations: Abdomen is soft. There is no mass.     Tenderness: There is no abdominal tenderness. There is no guarding or rebound.     Hernia: No hernia is present.  Musculoskeletal: Normal range of motion.  Lymphadenopathy:     Cervical: No cervical adenopathy.  Skin:    General: Skin is warm and dry.     Findings: No rash.  Neurological:     General: No focal deficit present.     Mental Status: He is alert and oriented to person, place, and time.     Comments: CN grossly intact, station and gait intact Recall 3/3 Calculation 5/5 serial 3s  Psychiatric:        Mood and Affect: Mood normal.        Behavior: Behavior normal.        Thought Content: Thought content normal.        Judgment: Judgment normal.       Results for orders placed or performed in visit on 09/08/17  PSA  Result Value Ref Range   PSA 2.17 0.10 - 4.00 ng/mL  Hemoglobin A1c  Result Value Ref Range   Hgb A1c MFr  Bld 6.0 4.6 - 6.5 %  Comprehensive metabolic panel  Result Value Ref Range   Sodium 140 135 - 145 mEq/L   Potassium 4.1 3.5 - 5.1 mEq/L   Chloride 103 96 - 112 mEq/L  CO2 29 19 - 32 mEq/L   Glucose, Bld 120 (H) 70 - 99 mg/dL   BUN 15 6 - 23 mg/dL   Creatinine, Ser 0.87 0.40 - 1.50 mg/dL   Total Bilirubin 0.7 0.2 - 1.2 mg/dL   Alkaline Phosphatase 83 39 - 117 U/L   AST 12 0 - 37 U/L   ALT 20 0 - 53 U/L   Total Protein 6.9 6.0 - 8.3 g/dL   Albumin 4.2 3.5 - 5.2 g/dL   Calcium 9.7 8.4 - 10.5 mg/dL   GFR 93.70 >60.00 mL/min  Lipid panel  Result Value Ref Range   Cholesterol 143 0 - 200 mg/dL   Triglycerides 86.0 0.0 - 149.0 mg/dL   HDL 53.20 >39.00 mg/dL   VLDL 17.2 0.0 - 40.0 mg/dL   LDL Cholesterol 73 0 - 99 mg/dL   Total CHOL/HDL Ratio 3    NonHDL 90.03    EKG - NSR rate 60s, normal axis, intervals, no acute ST/T changes. Poor R wave progression Assessment & Plan:   Problem List Items Addressed This Visit    Welcome to Medicare preventive visit - Primary    I have personally reviewed the Medicare Annual Wellness questionnaire and have noted 1. The patient's medical and social history 2. Their use of alcohol, tobacco or illicit drugs 3. Their current medications and supplements 4. The patient's functional ability including ADL's, fall risks, home safety risks and hearing or visual impairment. Cognitive function has been assessed and addressed as indicated.  5. Diet and physical activity 6. Evidence for depression or mood disorders The patients weight, height, BMI have been recorded in the chart. I have made referrals, counseling and provided education to the patient based on review of the above and I have provided the pt with a written personalized care plan for preventive services. Provider list updated.. See scanned questionairre as needed for further documentation. Reviewed preventative protocols and updated unless pt declined.       Relevant Orders   EKG 12-Lead  (Completed)   Smokeless tobacco use   Hypertension    Chronic, deteriorated. Reviewed importance of return to low salt diet. Increase diovan to 180/12.5mg  daily. RTC 1 mo HTN/DM f/u visit.       Relevant Medications   atorvastatin (LIPITOR) 40 MG tablet   valsartan-hydrochlorothiazide (DIOVAN HCT) 160-12.5 MG tablet   Hyperlipemia    Chronic, stable. Update labs. Continue statin. The 10-year ASCVD risk score Mikey Bussing DC Brooke Bonito., et al., 2013) is: 28.8%   Values used to calculate the score:     Age: 88 years     Sex: Male     Is Non-Hispanic African American: No     Diabetic: Yes     Tobacco smoker: No     Systolic Blood Pressure: 038 mmHg     Is BP treated: Yes     HDL Cholesterol: 53.2 mg/dL     Total Cholesterol: 143 mg/dL       Relevant Medications   atorvastatin (LIPITOR) 40 MG tablet   valsartan-hydrochlorothiazide (DIOVAN HCT) 160-12.5 MG tablet   Other Relevant Orders   Lipid panel   Comprehensive metabolic panel   Diabetes mellitus type 2, controlled, without complications (HCC)    Chronic. Overdue for f/u - check labs today. Upcoming eye exam.       Relevant Medications   atorvastatin (LIPITOR) 40 MG tablet   metFORMIN (GLUCOPHAGE) 500 MG tablet   valsartan-hydrochlorothiazide (DIOVAN HCT) 160-12.5 MG tablet   Other Relevant Orders  Hemoglobin A1c   Colon polyp    Declines colonoscopy today - agrees to iFOB. Stressed importance of f/u.       Bilateral carotid artery stenosis    Update carotid US.       Relevant Medications   atorvastatin (LIPITOR) 40 MG tablet   valsartan-hydrochlorothiazide (DIOVAN HCT) 160-12.5 MG tablet   Other Relevant Orders   VAS US CAROTID   Advanced care planning/counseling discussion    Advanced planning - does not have at home. Would want wife to be HCPOA. Packet provided today.       Acute pain of left shoulder    Anticipate RTC injury. No pain on palpation to shoulder bursa. Provided with exercises from [facility] pt advisor as well as  resistance band. Discussed OTC NSAID use. Update if not improving with this.        Other Visit Diagnoses    Special screening for malignant neoplasms, colon       Relevant Orders   Fecal occult blood, imunochemical   Need for vaccination with 13-polyvalent pneumococcal conjugate vaccine       Relevant Orders   Pneumococcal conjugate vaccine 13-valent IM (Completed)   Special screening for malignant neoplasm of prostate       Relevant Orders   PSA       Meds ordered this encounter  Medications  . atorvastatin (LIPITOR) 40 MG tablet    Sig: Take 1 tablet (40 mg total) by mouth daily.    Dispense:  90 tablet    Refill:  3  . metFORMIN (GLUCOPHAGE) 500 MG tablet    Sig: Take 1 tablet (500 mg total) by mouth daily with breakfast.    Dispense:  90 tablet    Refill:  3  . omeprazole (PRILOSEC) 20 MG capsule    Sig: TAKE 1 CAPSULE BY MOUTH ONCE DAILY    Dispense:  90 capsule    Refill:  3  . valsartan-hydrochlorothiazide (DIOVAN HCT) 160-12.5 MG tablet    Sig: Take 1 tablet by mouth daily.    Dispense:  90 tablet    Refill:  1   Orders Placed This Encounter  Procedures  . Fecal occult blood, imunochemical    Standing Status:   Future    Standing Expiration Date:   10/02/2019  . Pneumococcal conjugate vaccine 13-valent IM  . Lipid panel  . Comprehensive metabolic panel  . Hemoglobin A1c  . PSA  . EKG 12-Lead    Follow up plan: Return in about 4 weeks (around 10/30/2018) for follow up visit.  [name], MD

## 2018-10-02 NOTE — Assessment & Plan Note (Signed)
Update carotid US.  

## 2018-10-02 NOTE — Telephone Encounter (Signed)
Spoke with pt relaying Dr. G's message. Pt verbalizes understanding.  

## 2018-10-02 NOTE — Assessment & Plan Note (Addendum)
Chronic, deteriorated. Reviewed importance of return to low salt diet. Increase diovan to 180/12.5mg  daily. RTC 1 mo HTN/DM f/u visit.

## 2018-10-02 NOTE — Telephone Encounter (Signed)
I have sent in losartan hctz 100/12.5mg  to take daily in place of diovan hctz 160/12.5mg  due to back order.  plz notify patient.

## 2018-10-02 NOTE — Telephone Encounter (Signed)
Mark Davidson from [place] left v/m; valsartan HCTZ 160-12.5 mg is on manufacturer back order until middle of July; Pharmacist request substitution that would be equivalent is losartan-HCTZ  100/4.5 mg.Please advise.

## 2018-10-02 NOTE — Assessment & Plan Note (Signed)

## 2018-10-02 NOTE — Assessment & Plan Note (Addendum)
Chronic. Overdue for f/u - check labs today. Upcoming eye exam.

## 2018-10-02 NOTE — Assessment & Plan Note (Addendum)
Declines colonoscopy today - agrees to iFOB. Stressed importance of f/u.

## 2018-10-02 NOTE — Assessment & Plan Note (Signed)
Advanced planning - does not have at home. Would want wife to be HCPOA. Packet provided today.

## 2018-10-02 NOTE — Patient Instructions (Addendum)
Prevnar today Labs today.  Advanced directive packet provided today.  Pass by lab to pick up stool kit. We will update carotid artery neck ultrasound.  Blood pressure is staying too high - return in 1 month for follow up. Increase dose of diovan hctz.   Health Maintenance After Age 66 After age 2, you are at a higher risk for certain long-term diseases and infections as well as injuries from falls. Falls are a major cause of broken bones and head injuries in people who are older than age 16. Getting regular preventive care can help to keep you healthy and well. Preventive care includes getting regular testing and making lifestyle changes as recommended by your health care provider. Talk with your health care provider about:  Which screenings and tests you should have. A screening is a test that checks for a disease when you have no symptoms.  A diet and exercise plan that is right for you. What should I know about screenings and tests to prevent falls? Screening and testing are the best ways to find a health problem early. Early diagnosis and treatment give you the best chance of managing medical conditions that are common after age 83. Certain conditions and lifestyle choices may make you more likely to have a fall. Your health care provider may recommend:  Regular vision checks. Poor vision and conditions such as cataracts can make you more likely to have a fall. If you wear glasses, make sure to get your prescription updated if your vision changes.  Medicine review. Work with your health care provider to regularly review all of the medicines you are taking, including over-the-counter medicines. Ask your health care provider about any side effects that may make you more likely to have a fall. Tell your health care provider if any medicines that you take make you feel dizzy or sleepy.  Osteoporosis screening. Osteoporosis is a condition that causes the bones to get weaker. This can make the  bones weak and cause them to break more easily.  Blood pressure screening. Blood pressure changes and medicines to control blood pressure can make you feel dizzy.  Strength and balance checks. Your health care provider may recommend certain tests to check your strength and balance while standing, walking, or changing positions.  Foot health exam. Foot pain and numbness, as well as not wearing proper footwear, can make you more likely to have a fall.  Depression screening. You may be more likely to have a fall if you have a fear of falling, feel emotionally low, or feel unable to do activities that you used to do.  Alcohol use screening. Using too much alcohol can affect your balance and may make you more likely to have a fall. What actions can I take to lower my risk of falls? General instructions  Talk with your health care provider about your risks for falling. Tell your health care provider if: ? You fall. Be sure to tell your health care provider about all falls, even ones that seem minor. ? You feel dizzy, sleepy, or off-balance.  Take over-the-counter and prescription medicines only as told by your health care provider. These include any supplements.  Eat a healthy diet and maintain a healthy weight. A healthy diet includes low-fat dairy products, low-fat (lean) meats, and fiber from whole grains, beans, and lots of fruits and vegetables. Home safety  Remove any tripping hazards, such as rugs, cords, and clutter.  Install safety equipment such as grab bars in bathrooms and  safety rails on stairs.  Keep rooms and walkways well-lit. Activity   Follow a regular exercise program to stay fit. This will help you maintain your balance. Ask your health care provider what types of exercise are appropriate for you.  If you need a cane or walker, use it as recommended by your health care provider.  Wear supportive shoes that have nonskid soles. Lifestyle  Do not drink alcohol if your  health care provider tells you not to drink.  If you drink alcohol, limit how much you have: ? 0-1 drink a day for women. ? 0-2 drinks a day for men.  Be aware of how much alcohol is in your drink. In the [place] one drink equals one typical bottle of beer (12 oz), one-half glass of wine (5 oz), or one shot of hard liquor (1 oz).  Do not use any products that contain nicotine or tobacco, such as cigarettes and e-cigarettes. If you need help quitting, ask your health care provider. Summary  Having a healthy lifestyle and getting preventive care can help to protect your health and wellness after age 62.  Screening and testing are the best way to find a health problem early and help you avoid having a fall. Early diagnosis and treatment give you the best chance for managing medical conditions that are more common for people who are older than age 10.  Falls are a major cause of broken bones and head injuries in people who are older than age 55. Take precautions to prevent a fall at home.  Work with your health care provider to learn what changes you can make to improve your health and wellness and to prevent falls. This information is not intended to replace advice given to you by your health care provider. Make sure you discuss any questions you have with your health care provider. Document Released: 02/26/2017 Document Revised: 02/26/2017 Document Reviewed: 02/26/2017 Elsevier Interactive Patient Education  2019 [employer].

## 2018-10-02 NOTE — Assessment & Plan Note (Signed)
Anticipate RTC injury. No pain on palpation to shoulder bursa. Provided with exercises from [facility] pt advisor as well as resistance band. Discussed OTC NSAID use. Update if not improving with this.

## 2018-10-02 NOTE — Assessment & Plan Note (Signed)
Chronic, stable. Update labs. Continue statin. The 10-year ASCVD risk score Mikey Bussing DC Brooke Bonito., et al., 2013) is: 28.8%   Values used to calculate the score:     Age: 68 years     Sex: Male     Is Non-Hispanic African American: No     Diabetic: Yes     Tobacco smoker: No     Systolic Blood Pressure: 758 mmHg     Is BP treated: Yes     HDL Cholesterol: 53.2 mg/dL     Total Cholesterol: 143 mg/dL

## 2018-10-06 ENCOUNTER — Other Ambulatory Visit (INDEPENDENT_AMBULATORY_CARE_PROVIDER_SITE_OTHER): Payer: Medicare Other

## 2018-10-06 DIAGNOSIS — Z1211 Encounter for screening for malignant neoplasm of colon: Secondary | ICD-10-CM

## 2018-10-06 LAB — FECAL OCCULT BLOOD, GUAIAC: Fecal Occult Blood: NEGATIVE

## 2018-10-06 LAB — FECAL OCCULT BLOOD, IMMUNOCHEMICAL: Fecal Occult Bld: NEGATIVE

## 2018-10-08 ENCOUNTER — Encounter: Payer: Self-pay | Admitting: Family Medicine

## 2018-10-13 ENCOUNTER — Encounter: Payer: Self-pay | Admitting: Family Medicine

## 2018-10-13 LAB — HM DIABETES EYE EXAM

## 2018-10-23 ENCOUNTER — Encounter: Payer: Self-pay | Admitting: Family Medicine

## 2018-11-02 ENCOUNTER — Ambulatory Visit (INDEPENDENT_AMBULATORY_CARE_PROVIDER_SITE_OTHER): Payer: Medicare Other | Admitting: Family Medicine

## 2018-11-02 ENCOUNTER — Encounter: Payer: Self-pay | Admitting: Family Medicine

## 2018-11-02 ENCOUNTER — Other Ambulatory Visit: Payer: Self-pay

## 2018-11-02 VITALS — BP 152/70 | HR 68 | Temp 98.0°F | Ht 69.25 in | Wt 180.6 lb

## 2018-11-02 DIAGNOSIS — E119 Type 2 diabetes mellitus without complications: Secondary | ICD-10-CM | POA: Diagnosis not present

## 2018-11-02 DIAGNOSIS — I10 Essential (primary) hypertension: Secondary | ICD-10-CM | POA: Diagnosis not present

## 2018-11-02 MED ORDER — LOSARTAN POTASSIUM-HCTZ 100-25 MG PO TABS: 1.0000 | ORAL_TABLET | Freq: Every day | ORAL | 6 refills | Status: DC

## 2018-11-02 NOTE — Assessment & Plan Note (Addendum)
Chronic, will check on insurance preference for glucometer. Will consider DSME. RTC 3 mo f/u visit. Has lost 5 lbs since last visit. Reviewed low sugar diabetic diet.

## 2018-11-02 NOTE — Assessment & Plan Note (Signed)
Chronic, remains uncontrolled. Will increase losartan hctz to full dose 100/25mg . Reviewed low sodium diet, recommended restricting to 1.5gm. reassess at f/u visit.

## 2018-11-02 NOTE — Patient Instructions (Addendum)
Check on preferred insurance brand for glucose meter and let me know and I will send that in for you with strips.  Consider vitamin B12 1091mcg daily for tingling/numbness of feet. Return in 3-4 months for follow up visit with A1c. Consider multivitamin daily for several weeks to a month to see if any improvement in energy levels.   Increase losartan hctz to 100/25mg  daily. New dose at pharmacy.  Good to see you today, call us with questions.

## 2018-11-02 NOTE — Progress Notes (Signed)
This visit was conducted in person.  BP (!) 152/70 (BP Location: Right Arm, Patient Position: Sitting, Cuff Size: Normal)   Pulse 68   Temp 98 F (36.7 C) (Tympanic)   Ht 5' 9.25" (1.759 m)   Wt 180 lb 9 oz (81.9 kg)   SpO2 98%   BMI 26.47 kg/m   BP Readings from Last 3 Encounters:  11/02/18 (!) 152/70  10/02/18 (!) 158/70  12/08/17 118/62   on recheck bp 180/76  CC: 1 mo f/u visit Subjective:    Patient ID: Mark Davidson, male    DOB: [birth year], 68 y.o.   MRN: [id number]  HPI: Wilford Merryfield is a 68 y.o. male presenting on 11/02/2018 for Follow-up (Here for 1 mo DM and HTN f/u.)   See prior note for details. Seen here last month with elevated BP and sugars.  Taking omeprazole 20mg  daily for heartburn. He did recently start drinking more alcohol - 2-3 beers/week.   HTN - diovan hctz increased however was on back order so changed to losartan 100/12.5mg  daily. No HA, vision changes, CP/tightness, SOB, leg swelling. Ongoing dry cough a few times a day. Noticing increasing fatigue. Home BP readings with wrist cuff fluctuate as well.   DM - does not regularly check sugars - doesn't have glucose meter. Compliant with antihyperglycemic regimen which includes: metformin 500mg  daily. Denies low sugars or hypoglycemic symptoms. Occasional foot paresthesias. Last diabetic eye exam 09/2018. Pneumovax: 08/2017. Prevnar: 09/2018. Glucometer brand: does not have. DSME: will consider. Lab Results  Component Value Date   HGBA1C 7.7 (H) 10/02/2018   Diabetic Foot Exam - Simple   Simple Foot Form Diabetic Foot exam was performed with the following findings: Yes 11/02/2018  9:21 AM  Visual Inspection Sensation Testing Intact to touch and monofilament testing bilaterally: Yes Pulse Check Posterior Tibialis and Dorsalis pulse intact bilaterally: Yes Comments    Lab Results  Component Value Date   MICROALBUR <0.7 08/13/2016   No results found for: VITAMINB12        Relevant  past medical, surgical, family and social history reviewed and updated as indicated. Interim medical history since our last visit reviewed. Allergies and medications reviewed and updated. Outpatient Medications Prior to Visit  Medication Sig Dispense Refill  . atorvastatin (LIPITOR) 40 MG tablet Take 1 tablet (40 mg total) by mouth daily. 90 tablet 3  . diphenhydramine-acetaminophen (TYLENOL PM) 25-500 MG TABS Take 1 tablet by mouth at bedtime.     . fluticasone (FLONASE) 50 MCG/ACT nasal spray Place 2 sprays into both nostrils daily. 16 g 6  . metFORMIN (GLUCOPHAGE) 500 MG tablet Take 1 tablet (500 mg total) by mouth daily with breakfast. 90 tablet 3  . omeprazole (PRILOSEC) 20 MG capsule TAKE 1 CAPSULE BY MOUTH ONCE DAILY 90 capsule 3  . losartan-hydrochlorothiazide (HYZAAR) 100-12.5 MG tablet Take 1 tablet by mouth daily. 90 tablet 1   No facility-administered medications prior to visit.      Per HPI unless specifically indicated in ROS section below Review of Systems Objective:    BP (!) 152/70 (BP Location: Right Arm, Patient Position: Sitting, Cuff Size: Normal)   Pulse 68   Temp 98 F (36.7 C) (Tympanic)   Ht 5' 9.25" (1.759 m)   Wt 180 lb 9 oz (81.9 kg)   SpO2 98%   BMI 26.47 kg/m   Wt Readings from Last 3 Encounters:  11/02/18 180 lb 9 oz (81.9 kg)  10/02/18 185 lb 4 oz (84  kg)  12/08/17 176 lb 8 oz (80.1 kg)    Physical Exam Vitals signs and nursing note reviewed.  Constitutional:      General: He is not in acute distress.    Appearance: Normal appearance. He is well-developed.  HENT:     Head: Normocephalic and atraumatic.     Right Ear: External ear normal.     Left Ear: External ear normal.     Nose: Nose normal.     Mouth/Throat:     Pharynx: No oropharyngeal exudate.  Eyes:     General: No scleral icterus.    Conjunctiva/sclera: Conjunctivae normal.     Pupils: Pupils are equal, round, and reactive to light.  Neck:     Musculoskeletal: Normal range of  motion and neck supple.  Cardiovascular:     Rate and Rhythm: Normal rate and regular rhythm.     Pulses: Normal pulses.     Heart sounds: Normal heart sounds. No murmur.  Pulmonary:     Effort: Pulmonary effort is normal. No respiratory distress.     Breath sounds: Normal breath sounds. No wheezing, rhonchi or rales.  Musculoskeletal:     Right lower leg: No edema.     Left lower leg: No edema.     Comments: See HPI for foot exam if done  Lymphadenopathy:     Cervical: No cervical adenopathy.  Skin:    General: Skin is warm and dry.     Findings: No rash.  Neurological:     Mental Status: He is alert.       Results for orders placed or performed in visit on 10/23/18  HM DIABETES EYE EXAM  Result Value Ref Range   HM Diabetic Eye Exam No Retinopathy No Retinopathy   Lab Results  Component Value Date   CREATININE 1.03 10/02/2018   BUN 14 10/02/2018   NA 139 10/02/2018   K 4.2 10/02/2018   CL 101 10/02/2018   CO2 29 10/02/2018    Assessment & Plan:   Problem List Items Addressed This Visit    Hypertension    Chronic, remains uncontrolled. Will increase losartan hctz to full dose 100/25mg . Reviewed low sodium diet, recommended restricting to 1.5gm. reassess at f/u visit.       Relevant Medications   losartan-hydrochlorothiazide (HYZAAR) 100-25 MG tablet   Diabetes mellitus type 2, controlled, without complications (HCC) - Primary    Chronic, will check on insurance preference for glucometer. Will consider DSME. RTC 3 mo f/u visit. Has lost 5 lbs since last visit. Reviewed low sugar diabetic diet.       Relevant Medications   losartan-hydrochlorothiazide (HYZAAR) 100-25 MG tablet       Meds ordered this encounter  Medications  . losartan-hydrochlorothiazide (HYZAAR) 100-25 MG tablet    Sig: Take 1 tablet by mouth daily.    Dispense:  30 tablet    Refill:  6    Note new dosage   No orders of the defined types were placed in this encounter.   Follow up plan:  Return in about 3 months (around 02/02/2019), or if symptoms worsen or fail to improve, for follow up visit.  [name], MD

## 2018-12-17 ENCOUNTER — Other Ambulatory Visit: Payer: Self-pay | Admitting: Family Medicine

## 2018-12-17 DIAGNOSIS — K219 Gastro-esophageal reflux disease without esophagitis: Secondary | ICD-10-CM

## 2018-12-17 NOTE — Telephone Encounter (Signed)
Patient called and said his rx is for 1 pill a day and it should be for 2 pills a day.  Patient said he has 3 pills left.

## 2018-12-18 DIAGNOSIS — K219 Gastro-esophageal reflux disease without esophagitis: Secondary | ICD-10-CM | POA: Insufficient documentation

## 2018-12-18 NOTE — Telephone Encounter (Signed)
Looking back directions were changed to 1 tablet once daily in March of 2018. Please review

## 2018-12-18 NOTE — Telephone Encounter (Signed)
Will refill BID and review at next appt.

## 2019-02-16 ENCOUNTER — Encounter: Payer: Self-pay | Admitting: Family Medicine

## 2019-02-16 ENCOUNTER — Other Ambulatory Visit: Payer: Self-pay

## 2019-02-16 ENCOUNTER — Ambulatory Visit (INDEPENDENT_AMBULATORY_CARE_PROVIDER_SITE_OTHER): Payer: Medicare Other | Admitting: Family Medicine

## 2019-02-16 VITALS — BP 172/78 | HR 76 | Temp 97.9°F | Ht 69.25 in | Wt 177.2 lb

## 2019-02-16 DIAGNOSIS — I10 Essential (primary) hypertension: Secondary | ICD-10-CM | POA: Diagnosis not present

## 2019-02-16 DIAGNOSIS — E119 Type 2 diabetes mellitus without complications: Secondary | ICD-10-CM | POA: Diagnosis not present

## 2019-02-16 LAB — POCT GLYCOSYLATED HEMOGLOBIN (HGB A1C): Hemoglobin A1C: 6.6 % — AB (ref 4.0–5.6)

## 2019-02-16 MED ORDER — FLUTICASONE PROPIONATE 50 MCG/ACT NA SUSP: 2.0000 | Freq: Every day | NASAL | 6 refills | Status: DC

## 2019-02-16 MED ORDER — AMLODIPINE BESYLATE 5 MG PO TABS: 5.0000 mg | ORAL_TABLET | Freq: Every day | ORAL | 6 refills | Status: DC

## 2019-02-16 NOTE — Assessment & Plan Note (Signed)
Chronic, improved. Congratulated on improved A1c readings. Continue metformin. He will consider DSME.

## 2019-02-16 NOTE — Progress Notes (Signed)
This visit was conducted in person.  BP (!) 172/78 (BP Location: Right Arm, Patient Position: Sitting, Cuff Size: Normal)   Pulse 76   Temp 97.9 F (36.6 C) (Temporal)   Ht 5' 9.25" (1.759 m)   Wt 177 lb 3 oz (80.4 kg)   SpO2 99%   BMI 25.98 kg/m   On repeat 190/80  CC: 3-4 mo f/u visit Subjective:    Patient ID: Mark Davidson, male    DOB: 05-24-1950, 68 y.o.   MRN: [id number]  HPI: Mark Davidson is a 68 y.o. male presenting on 02/16/2019 for Follow-up (Here for 3-4 mo f/u.)   HTN - Compliant with current antihypertensive regimen of losartan hctz 100/25mg  daily (diovan was on back order). Does check blood pressures at home: 120-130/70s (wrist cuff). He's been limiting salt intake. No low blood pressure readings or symptoms of dizziness/syncope. Some dull headache. Denies vision changes, CP/tightness, SOB, leg swelling. Stopped alcohol for several days now. Was previously drinking 2 beers/day.   DM - does not regularly check sugars. Compliant with antihyperglycemic regimen which includes: metformin 500mg  daily. Already limits sweets. Denies low sugars or hypoglycemic symptoms. Denies paresthesias. Last diabetic eye exam 09/2018. Pneumovax: 08/2017. Prevnar: 09/2018. Glucometer brand: does not have. DSME: declines. Lab Results  Component Value Date   HGBA1C 6.6 (A) 02/16/2019   Diabetic Foot Exam - Simple   No data filed     Lab Results  Component Value Date   MICROALBUR <0.7 08/13/2016        Relevant past medical, surgical, family and social history reviewed and updated as indicated. Interim medical history since our last visit reviewed. Allergies and medications reviewed and updated. Outpatient Medications Prior to Visit  Medication Sig Dispense Refill  . atorvastatin (LIPITOR) 40 MG tablet Take 1 tablet (40 mg total) by mouth daily. 90 tablet 3  . diphenhydramine-acetaminophen (TYLENOL PM) 25-500 MG TABS Take 1 tablet by mouth at bedtime.     Marland Kitchen  losartan-hydrochlorothiazide (HYZAAR) 100-25 MG tablet Take 1 tablet by mouth daily. 30 tablet 6  . metFORMIN (GLUCOPHAGE) 500 MG tablet Take 1 tablet (500 mg total) by mouth daily with breakfast. 90 tablet 3  . omeprazole (PRILOSEC) 20 MG capsule Take 1 capsule (20 mg total) by mouth 2 (two) times daily before a meal. 180 capsule 1  . fluticasone (FLONASE) 50 MCG/ACT nasal spray Place 2 sprays into both nostrils daily. 16 g 6   No facility-administered medications prior to visit.      Per HPI unless specifically indicated in ROS section below Review of Systems Objective:    BP (!) 172/78 (BP Location: Right Arm, Patient Position: Sitting, Cuff Size: Normal)   Pulse 76   Temp 97.9 F (36.6 C) (Temporal)   Ht 5' 9.25" (1.759 m)   Wt 177 lb 3 oz (80.4 kg)   SpO2 99%   BMI 25.98 kg/m   Wt Readings from Last 3 Encounters:  02/16/19 177 lb 3 oz (80.4 kg)  11/02/18 180 lb 9 oz (81.9 kg)  10/02/18 185 lb 4 oz (84 kg)    Physical Exam Vitals signs and nursing note reviewed.  Constitutional:      General: He is not in acute distress.    Appearance: Normal appearance. He is well-developed and normal weight. He is not ill-appearing.  HENT:     Head: Normocephalic and atraumatic.     Right Ear: External ear normal.     Left Ear: External ear normal.  Nose: Nose normal.     Mouth/Throat:     Mouth: Mucous membranes are moist.     Pharynx: Oropharynx is clear. No posterior oropharyngeal erythema.  Eyes:     General: No scleral icterus.    Conjunctiva/sclera: Conjunctivae normal.     Pupils: Pupils are equal, round, and reactive to light.  Neck:     Musculoskeletal: Normal range of motion and neck supple.  Cardiovascular:     Rate and Rhythm: Normal rate and regular rhythm.     Pulses: Normal pulses.     Heart sounds: Normal heart sounds. No murmur.  Pulmonary:     Effort: Pulmonary effort is normal. No respiratory distress.     Breath sounds: Normal breath sounds. No wheezing,  rhonchi or rales.  Musculoskeletal:     Right lower leg: No edema.     Left lower leg: No edema.     Comments: See HPI for foot exam if done  Lymphadenopathy:     Cervical: No cervical adenopathy.  Skin:    General: Skin is warm and dry.     Findings: No rash.  Neurological:     Mental Status: He is alert.  Psychiatric:        Behavior: Behavior normal.       Results for orders placed or performed in visit on 02/16/19  POCT glycosylated hemoglobin (Hb A1C)  Result Value Ref Range   Hemoglobin A1C 6.6 (A) 4.0 - 5.6 %   HbA1c POC (<> result, manual entry)     HbA1c, POC (prediabetic range)     HbA1c, POC (controlled diabetic range)     Assessment & Plan:   Problem List Items Addressed This Visit    Hypertension    Progressive. Discussed with patient. Encouraged potassium rich diet. Continue losartan hctz, start amlodipine 5mg  daily sent to pharmacy. Suggested he buy new BP cuff given concerns over accuracy of current wrist cuff. Pt agrees with plan. RTC 3 mo f/u visit.       Relevant Medications   amLODipine (NORVASC) 5 MG tablet   Diabetes mellitus type 2, controlled, without complications (HCC) - Primary    Chronic, improved. Congratulated on improved A1c readings. Continue metformin. He will consider DSME.       Relevant Orders   POCT glycosylated hemoglobin (Hb A1C) (Completed)       Meds ordered this encounter  Medications  . fluticasone (FLONASE) 50 MCG/ACT nasal spray    Sig: Place 2 sprays into both nostrils daily.    Dispense:  16 g    Refill:  6  . amLODipine (NORVASC) 5 MG tablet    Sig: Take 1 tablet (5 mg total) by mouth daily.    Dispense:  30 tablet    Refill:  6   Orders Placed This Encounter  Procedures  . POCT glycosylated hemoglobin (Hb A1C)    Patient Instructions  Keep trying to see what glucose meter your insurance prefers.  Sugar is doing much better!  Start amlodipine 5mg  daily in addition to losartan hctz.  Return in another 3  months for follow up visit.    Follow up plan: Return in about 3 months (around 05/19/2019) for follow up visit.  [name], MD

## 2019-02-16 NOTE — Patient Instructions (Addendum)
Keep trying to see what glucose meter your insurance prefers.  Sugar is doing much better!  Start amlodipine 5mg  daily in addition to losartan hctz.  Return in another 3 months for follow up visit.

## 2019-02-16 NOTE — Assessment & Plan Note (Signed)
Progressive. Discussed with patient. Encouraged potassium rich diet. Continue losartan hctz, start amlodipine 5mg  daily sent to pharmacy. Suggested he buy new BP cuff given concerns over accuracy of current wrist cuff. Pt agrees with plan. RTC 3 mo f/u visit.

## 2019-03-04 ENCOUNTER — Telehealth: Payer: Self-pay

## 2019-03-04 MED ORDER — CARVEDILOL 6.25 MG PO TABS: 6.2500 mg | ORAL_TABLET | Freq: Two times a day (BID) | ORAL | 3 refills | Status: DC

## 2019-03-04 NOTE — Telephone Encounter (Signed)
Spoke with patient. Patient advised of everything. Patient already picked up the new medication. Patient states he has a wrist B/P monitor that he discussed with Dr Darnell Level before how it is not very accurate and gives 2 different readings 1 minute apart or so.  He does not have a meter to check his sugars.  What should patient do about his b/p readings, as far as how to get this checked. I asked patient if he is able to get a new machine bit he did not answer for sure.  Also patient wants to know  if it is ok to take some Benadryl for the itching and burning sensation? It is not as bad as it was yesterday but still present.

## 2019-03-04 NOTE — Telephone Encounter (Signed)
On 02/17/19 pt started norvasc 5 mg daily;on 03/03/19 pt noticed uncomfortable burning and itching sensation to skin on upper body. No rash,no redness or blisters. Pharmacist advised could be side effect of Norvasc. No difficulty breathing,no swelling of lips, tongue,mouth or throat; no difficulty swallowing. Pt has already taken norvasc today. Pt will hold norvasc until further instruction from Dr Darnell Level. UC & ED precautions given.office visit on 02/16/19. [facility].

## 2019-03-04 NOTE — Telephone Encounter (Signed)
Attempted to contact pt.  No answer.  No vm.  Need to relay Dr. G's message.  

## 2019-03-04 NOTE — Telephone Encounter (Signed)
Stop amlodipine due to possible allergy.  Start BP med carvedilol 6.25mg  bid in its place sent to pharmacy.  Monitor blood sugars and heart rate closely as this medicine can drop both.  Update [place] with BP readings in 2 wks.

## 2019-03-05 NOTE — Telephone Encounter (Addendum)
Ok to take benadryl PRN.  Recommend he stop by local pharmacy and use pharmacy cuff to check bp if able. Otherwise recommend he buy new upper arm BP cuff.

## 2019-03-05 NOTE — Telephone Encounter (Signed)
Notified pt as instructed by phone.  Verbalizes understanding.

## 2019-03-07 ENCOUNTER — Encounter: Payer: Self-pay | Admitting: Family Medicine

## 2019-03-09 ENCOUNTER — Encounter: Payer: Self-pay | Admitting: Family Medicine

## 2019-03-11 MED ORDER — CARVEDILOL 3.125 MG PO TABS: 3.1250 mg | ORAL_TABLET | Freq: Two times a day (BID) | ORAL | 3 refills | Status: DC

## 2019-03-22 ENCOUNTER — Encounter: Payer: Self-pay | Admitting: Family Medicine

## 2019-05-21 ENCOUNTER — Telehealth: Payer: Self-pay

## 2019-05-21 NOTE — Telephone Encounter (Signed)
Patient called back checking on this. He needs to hear back today please.

## 2019-05-21 NOTE — Telephone Encounter (Signed)
Spoke with patient.  That is not a common side effect of carvedilol.  Recommend try gas X or simethicone over the counter for these symptoms, and decrease carvedilol to QAM only until appt next week. If worsening GI symptoms over weekend, seen [facility] eval. He agrees.

## 2019-05-21 NOTE — Telephone Encounter (Signed)
Pt said since pt started the carvedilol 3.125 mg taking one tab bid 03/11/19 pt has had uncomfortable feelin in upper stomach like a bloating sensation. Pt said difficult to describe; pt said few times have passed gas and felt better for short period and then the discomfort comes back. Pt usually cannot burp or pass gas. Pt last normal BM 05/20/19. Last night had worse discomfort at level of 5.  Omeprazole helps reflux but not this uncomfortable feeling. Pt wonders if could be the carvedilol. Pt has dull H/A that comes and goes; prod cough with cloudy phlegm on and off  for years, runny nose; no other covid symptoms, no travel and no known exposure to covid. Pt request cb after Dr Darnell Level reviews. UC & ED precautions given and pt voiced understanding. [place].

## 2019-05-27 ENCOUNTER — Other Ambulatory Visit: Payer: Self-pay

## 2019-05-27 ENCOUNTER — Ambulatory Visit (INDEPENDENT_AMBULATORY_CARE_PROVIDER_SITE_OTHER): Payer: Medicare Other | Admitting: Family Medicine

## 2019-05-27 ENCOUNTER — Encounter: Payer: Self-pay | Admitting: Family Medicine

## 2019-05-27 VITALS — BP 140/62 | HR 70 | Temp 96.5°F | Ht 69.0 in | Wt 176.2 lb

## 2019-05-27 DIAGNOSIS — K219 Gastro-esophageal reflux disease without esophagitis: Secondary | ICD-10-CM

## 2019-05-27 DIAGNOSIS — E119 Type 2 diabetes mellitus without complications: Secondary | ICD-10-CM

## 2019-05-27 DIAGNOSIS — I10 Essential (primary) hypertension: Secondary | ICD-10-CM

## 2019-05-27 LAB — POCT GLYCOSYLATED HEMOGLOBIN (HGB A1C): Hemoglobin A1C: 6.8 % — AB (ref 4.0–5.6)

## 2019-05-27 MED ORDER — LOSARTAN POTASSIUM-HCTZ 100-25 MG PO TABS: 1.0000 | ORAL_TABLET | Freq: Every day | ORAL | 3 refills | Status: DC

## 2019-05-27 NOTE — Patient Instructions (Addendum)
Ok to change omeprazole to once daily as needed.  Trial off metformin for 2 weeks to see if improvement in GI issues. Call me or send me message in 2 weeks with an update on GI symptoms off metformin.  Check on preferred glucometer brand by insurance.  Return in 3-4 months for diabetes follow up visit.

## 2019-05-27 NOTE — Assessment & Plan Note (Signed)
Chronic, stable. Ideally want better control. He is taking carvedilol once daily due to concerns over GI side effects. See below.

## 2019-05-27 NOTE — Assessment & Plan Note (Signed)
Stable period - will change PPI to PRN.

## 2019-05-27 NOTE — Assessment & Plan Note (Signed)
Chronic, stable. Given GI concerns will hold metformin for 2 wks then will have him call us or Mychart [place] with an update. Discussed possible XR formulation as option.

## 2019-05-27 NOTE — Progress Notes (Addendum)
This visit was conducted in person.  BP 140/62   Pulse 70   Temp (!) 96.5 F (35.8 C)   Ht 5\' 9"  (1.753 m)   Wt 176 lb 3.2 oz (79.9 kg)   SpO2 99%   BMI 26.02 kg/m    CC: 3 mo f/u visit Subjective:    Patient ID: Mark Davidson, male    DOB: 07-05-1950, 69 y.o.   MRN: [id number]  HPI: Nitin Holverson is a 69 y.o. male presenting on 05/27/2019 for 3 month follow up and Diabetes   Ongoing GI upset worse at night since he started carvedilol - improvement noted with GasX. Not having GERD symptoms but continues omeprazole 20mg  daily - would like to change to PRN. Did not tolerate amlodipine.   DM - does not regularly check sugars - doesn't have glucometer. Compliant with antihyperglycemic regimen which includes: metformin 500mg  daily. Has been eating more bread. Uses sugar free sweeteners, coke zero, good water and propel water. Denies low sugars or hypoglycemic symptoms. Denies paresthesias. Last diabetic eye exam 09/2018. Pneumovax: 08/2017. Prevnar: due. Glucometer brand: unsure what insurance will approve. DSME: will consider.  Lab Results  Component Value Date   HGBA1C 6.8 (A) 05/27/2019   Diabetic Foot Exam - Simple   Simple Foot Form Diabetic Foot exam was performed with the following findings: Yes 05/27/2019  9:46 AM  Visual Inspection No deformities, no ulcerations, no other skin breakdown bilaterally: Yes Sensation Testing Intact to touch and monofilament testing bilaterally: Yes Pulse Check Posterior Tibialis and Dorsalis pulse intact bilaterally: Yes Comments    Lab Results  Component Value Date   MICROALBUR <0.7 08/13/2016         Relevant past medical, surgical, family and social history reviewed and updated as indicated. Interim medical history since our last visit reviewed. Allergies and medications reviewed and updated. Outpatient Medications Prior to Visit  Medication Sig Dispense Refill  . atorvastatin (LIPITOR) 40 MG tablet Take 1 tablet (40 mg  total) by mouth daily. 90 tablet 3  . carvedilol (COREG) 3.125 MG tablet Take 1 tablet (3.125 mg total) by mouth 2 (two) times daily with a meal. (Patient taking differently: Take 3.125 mg by mouth daily. ) 60 tablet 3  . diphenhydramine-acetaminophen (TYLENOL PM) 25-500 MG TABS Take 1 tablet by mouth at bedtime.     . fluticasone (FLONASE) 50 MCG/ACT nasal spray Place 2 sprays into both nostrils daily. (Patient taking differently: Place 2 sprays into both nostrils as needed. ) 16 g 6  . metFORMIN (GLUCOPHAGE) 500 MG tablet Take 1 tablet (500 mg total) by mouth daily with breakfast. 90 tablet 3  . omeprazole (PRILOSEC) 20 MG capsule Take 1 capsule (20 mg total) by mouth daily as needed (GERD).    Marland Kitchen losartan-hydrochlorothiazide (HYZAAR) 100-25 MG tablet Take 1 tablet by mouth daily. 30 tablet 6  . omeprazole (PRILOSEC) 20 MG capsule Take 1 capsule (20 mg total) by mouth 2 (two) times daily before a meal. (Patient taking differently: Take 20 mg by mouth daily. ) 180 capsule 1   No facility-administered medications prior to visit.     Per HPI unless specifically indicated in ROS section below Review of Systems Objective:    BP 140/62   Pulse 70   Temp (!) 96.5 F (35.8 C)   Ht 5\' 9"  (1.753 m)   Wt 176 lb 3.2 oz (79.9 kg)   SpO2 99%   BMI 26.02 kg/m   Wt Readings from Last 3  Encounters:  05/27/19 176 lb 3.2 oz (79.9 kg)  02/16/19 177 lb 3 oz (80.4 kg)  11/02/18 180 lb 9 oz (81.9 kg)    Physical Exam Vitals and nursing note reviewed.  Constitutional:      General: He is not in acute distress.    Appearance: Normal appearance. He is well-developed. He is not ill-appearing.  HENT:     Nose: Nose normal. No congestion or rhinorrhea.     Mouth/Throat:     Mouth: Mucous membranes are moist.     Pharynx: Oropharynx is clear. No oropharyngeal exudate or posterior oropharyngeal erythema.  Eyes:     General: No scleral icterus.    Extraocular Movements: Extraocular movements intact.      Conjunctiva/sclera: Conjunctivae normal.     Pupils: Pupils are equal, round, and reactive to light.  Cardiovascular:     Rate and Rhythm: Normal rate and regular rhythm.     Pulses: Normal pulses.     Heart sounds: Normal heart sounds. No murmur.  Pulmonary:     Effort: Pulmonary effort is normal. No respiratory distress.     Breath sounds: Normal breath sounds. No wheezing, rhonchi or rales.  Musculoskeletal:     Cervical back: Normal range of motion and neck supple.     Right lower leg: No edema.     Left lower leg: No edema.     Comments: See HPI for foot exam if done  Lymphadenopathy:     Cervical: No cervical adenopathy.  Skin:    General: Skin is warm and dry.     Findings: No rash.  Neurological:     Mental Status: He is alert.  Psychiatric:        Mood and Affect: Mood normal.        Behavior: Behavior normal.       Results for orders placed or performed in visit on 05/27/19  POCT glycosylated hemoglobin (Hb A1C)  Result Value Ref Range   Hemoglobin A1C 6.8 (A) 4.0 - 5.6 %   HbA1c POC (<> result, manual entry)     HbA1c, POC (prediabetic range)     HbA1c, POC (controlled diabetic range)     Assessment & Plan:  This visit occurred during the SARS-CoV-2 public health emergency.  Safety protocols were in place, including screening questions prior to the visit, additional usage of staff PPE, and extensive cleaning of exam room while observing appropriate contact time as indicated for disinfecting solutions.   Problem List Items Addressed This Visit    Hypertension    Chronic, stable. Ideally want better control. He is taking carvedilol once daily due to concerns over GI side effects. See below.       Relevant Medications   losartan-hydrochlorothiazide (HYZAAR) 100-25 MG tablet   GERD (gastroesophageal reflux disease)    Stable period - will change PPI to PRN.       Relevant Medications   omeprazole (PRILOSEC) 20 MG capsule   Diabetes mellitus type 2,  controlled, without complications (HCC) - Primary    Chronic, stable. Given GI concerns will hold metformin for 2 wks then will have him call us or [facility] [place] with an update. Discussed possible XR formulation as option.       Relevant Medications   losartan-hydrochlorothiazide (HYZAAR) 100-25 MG tablet   Other Relevant Orders   POCT glycosylated hemoglobin (Hb A1C) (Completed)       Meds ordered this encounter  Medications  . losartan-hydrochlorothiazide (HYZAAR) 100-25 MG tablet  Sig: Take 1 tablet by mouth daily.    Dispense:  90 tablet    Refill:  3   Orders Placed This Encounter  Procedures  . POCT glycosylated hemoglobin (Hb A1C)    Patient Instructions  Ok to change omeprazole to once daily as needed.  Trial off metformin for 2 weeks to see if improvement in GI issues. Call me or send me message in 2 weeks with an update on GI symptoms off metformin.  Check on preferred glucometer brand by insurance.  Return in 3-4 months for diabetes follow up visit.    Follow up plan: Return in about 3 months (around 08/25/2019), or if symptoms worsen or fail to improve, for follow up visit.  [name], MD

## 2019-06-07 ENCOUNTER — Telehealth: Payer: Self-pay

## 2019-06-07 DIAGNOSIS — R109 Unspecified abdominal pain: Secondary | ICD-10-CM

## 2019-06-07 NOTE — Telephone Encounter (Signed)
Pt had visit on 05/27/19 and pt stopped metformin as instructed. Pt said is not helping; pt continues with every night having growling sounds and a lot of gas. Pt restarted metformin 500 mg taking 1 tab at breakfast on 06/06/19. Pt is taking omeprazole 20 mg taking one daily as needed only. Pt also said taking carvedilol 3.125 mg one tab daily and losartan HCTZ 100-25 mg taking one daily at the same time. BP averaging 130/70. Pt wanted Dr Darnell Level to have updates and pt wants to make sure it is OK to take carvedilol and losartan HCTZ at same time. Pt request cb with any other suggestions about the GI issue. [place].

## 2019-06-08 NOTE — Telephone Encounter (Signed)
Ok to take meds at same time.  Given ongoing symptoms, try off carvedilol for 2 weeks. Would also have him come in for labwork (plz schedule). Labs ordered.  If normal and no benefit off carvedilol, we may trial extended release metformin.

## 2019-06-09 NOTE — Telephone Encounter (Signed)
Pt returning call.  Relayed Dr. Synthia Innocent message.  Pt verbalizes understanding.  Will stop carvedilol tomorrow.  Scheduled lab visit on 06/25/19 at 3:45.

## 2019-06-09 NOTE — Telephone Encounter (Signed)
Lvm asking pt to call back.  Need to relay Dr. G's message.  

## 2019-06-25 ENCOUNTER — Other Ambulatory Visit (INDEPENDENT_AMBULATORY_CARE_PROVIDER_SITE_OTHER): Payer: Medicare Other

## 2019-06-25 ENCOUNTER — Other Ambulatory Visit: Payer: Self-pay

## 2019-06-25 DIAGNOSIS — R109 Unspecified abdominal pain: Secondary | ICD-10-CM | POA: Diagnosis not present

## 2019-06-25 LAB — CBC WITH DIFFERENTIAL/PLATELET
Absolute Monocytes: 828 cells/uL (ref 200–950)
Basophils Absolute: 72 cells/uL (ref 0–200)
Basophils Relative: 0.8 %
Eosinophils Absolute: 549 cells/uL — ABNORMAL HIGH (ref 15–500)
Eosinophils Relative: 6.1 %
HCT: 39.2 % (ref 38.5–50.0)
Hemoglobin: 12.7 g/dL — ABNORMAL LOW (ref 13.2–17.1)
Lymphs Abs: 2088 cells/uL (ref 850–3900)
MCH: 29.4 pg (ref 27.0–33.0)
MCHC: 32.4 g/dL (ref 32.0–36.0)
MCV: 90.7 fL (ref 80.0–100.0)
MPV: 12.2 fL (ref 7.5–12.5)
Monocytes Relative: 9.2 %
Neutro Abs: 5463 cells/uL (ref 1500–7800)
Neutrophils Relative %: 60.7 %
Platelets: 219 10*3/uL (ref 140–400)
RBC: 4.32 10*6/uL (ref 4.20–5.80)
RDW: 11.9 % (ref 11.0–15.0)
Total Lymphocyte: 23.2 %
WBC: 9 10*3/uL (ref 3.8–10.8)

## 2019-06-25 LAB — COMPREHENSIVE METABOLIC PANEL
AG Ratio: 2.2 (calc) (ref 1.0–2.5)
ALT: 22 U/L (ref 9–46)
AST: 15 U/L (ref 10–35)
Albumin: 4.4 g/dL (ref 3.6–5.1)
Alkaline phosphatase (APISO): 67 U/L (ref 35–144)
BUN: 21 mg/dL (ref 7–25)
CO2: 27 mmol/L (ref 20–32)
Calcium: 9.7 mg/dL (ref 8.6–10.3)
Chloride: 103 mmol/L (ref 98–110)
Creat: 1.13 mg/dL (ref 0.70–1.25)
Globulin: 2 g/dL (calc) (ref 1.9–3.7)
Glucose, Bld: 127 mg/dL — ABNORMAL HIGH (ref 65–99)
Potassium: 4.2 mmol/L (ref 3.5–5.3)
Sodium: 139 mmol/L (ref 135–146)
Total Bilirubin: 0.4 mg/dL (ref 0.2–1.2)
Total Protein: 6.4 g/dL (ref 6.1–8.1)

## 2019-06-25 LAB — LIPASE: Lipase: 29 U/L (ref 7–60)

## 2019-06-25 NOTE — Addendum Note (Signed)
Addended by: Cloyd Stagers on: 06/25/2019 03:23 PM   Modules accepted: Orders

## 2019-06-26 ENCOUNTER — Ambulatory Visit: Payer: Medicare Other | Attending: Internal Medicine

## 2019-06-26 ENCOUNTER — Encounter: Payer: Self-pay | Admitting: Family Medicine

## 2019-06-26 DIAGNOSIS — Z23 Encounter for immunization: Secondary | ICD-10-CM

## 2019-06-26 NOTE — Progress Notes (Signed)
   Covid-19 Vaccination Clinic  Name:  Koven Wolfgramm    MRN: [id number] DOB: [birth year]  06/26/2019  Mr. Ferns was observed post Covid-19 immunization for 15 minutes without incidence. He was provided with Vaccine Information Sheet and instruction to access the V-Safe system.   Mr. Dooling was instructed to call 911 with any severe reactions post vaccine: Marland Kitchen Difficulty breathing  . Swelling of your face and throat  . A fast heartbeat  . A bad rash all over your body  . Dizziness and weakness    Immunizations Administered    Name Date Dose VIS Date Route   Moderna COVID-19 Vaccine 06/26/2019  2:27 PM 0.5 mL 03/30/2019 Intramuscular   Manufacturer: Moderna   Lot: [id number]   [id number]

## 2019-07-24 ENCOUNTER — Ambulatory Visit: Payer: Medicare Other | Attending: Internal Medicine

## 2019-07-24 DIAGNOSIS — Z23 Encounter for immunization: Secondary | ICD-10-CM

## 2019-07-24 NOTE — Progress Notes (Signed)
   Covid-19 Vaccination Clinic  Name:  Mark Davidson    MRN: [id number] DOB: 1950-06-23  07/24/2019  Mr. Mark Davidson was observed post Covid-19 immunization for 15 minutes without incident. He was provided with Vaccine Information Sheet and instruction to access the V-Safe system.   Mr. Mark Davidson was instructed to call 911 with any severe reactions post vaccine: Marland Kitchen Difficulty breathing  . Swelling of face and throat  . A fast heartbeat  . A bad rash all over body  . Dizziness and weakness   Immunizations Administered    Name Date Dose VIS Date Route   Moderna COVID-19 Vaccine 07/24/2019 10:59 AM 0.5 mL 03/30/2019 Intramuscular   Manufacturer: Levan Hurst   LotUD:[id number]   [place][id number]

## 2019-08-27 ENCOUNTER — Ambulatory Visit (INDEPENDENT_AMBULATORY_CARE_PROVIDER_SITE_OTHER): Payer: Medicare Other | Admitting: Family Medicine

## 2019-08-27 ENCOUNTER — Encounter: Payer: Self-pay | Admitting: Family Medicine

## 2019-08-27 ENCOUNTER — Other Ambulatory Visit: Payer: Self-pay

## 2019-08-27 VITALS — BP 140/60 | HR 71 | Temp 97.5°F | Ht 69.0 in | Wt 180.4 lb

## 2019-08-27 DIAGNOSIS — E119 Type 2 diabetes mellitus without complications: Secondary | ICD-10-CM | POA: Diagnosis not present

## 2019-08-27 DIAGNOSIS — I10 Essential (primary) hypertension: Secondary | ICD-10-CM

## 2019-08-27 DIAGNOSIS — R05 Cough: Secondary | ICD-10-CM

## 2019-08-27 DIAGNOSIS — R519 Headache, unspecified: Secondary | ICD-10-CM

## 2019-08-27 DIAGNOSIS — E1169 Type 2 diabetes mellitus with other specified complication: Secondary | ICD-10-CM | POA: Diagnosis not present

## 2019-08-27 DIAGNOSIS — G8929 Other chronic pain: Secondary | ICD-10-CM

## 2019-08-27 DIAGNOSIS — J301 Allergic rhinitis due to pollen: Secondary | ICD-10-CM

## 2019-08-27 DIAGNOSIS — M7061 Trochanteric bursitis, right hip: Secondary | ICD-10-CM

## 2019-08-27 DIAGNOSIS — E785 Hyperlipidemia, unspecified: Secondary | ICD-10-CM

## 2019-08-27 DIAGNOSIS — M25512 Pain in left shoulder: Secondary | ICD-10-CM

## 2019-08-27 DIAGNOSIS — R059 Cough, unspecified: Secondary | ICD-10-CM

## 2019-08-27 DIAGNOSIS — L299 Pruritus, unspecified: Secondary | ICD-10-CM

## 2019-08-27 LAB — POCT GLYCOSYLATED HEMOGLOBIN (HGB A1C): Hemoglobin A1C: 6.7 % — AB (ref 4.0–5.6)

## 2019-08-27 MED ORDER — FLUTICASONE PROPIONATE 50 MCG/ACT NA SUSP: 2.0000 | Freq: Every day | NASAL | 6 refills | Status: DC | PRN

## 2019-08-27 NOTE — Progress Notes (Signed)
This visit was conducted in person.  BP 140/60 (BP Location: Left Arm, Patient Position: Sitting, Cuff Size: Normal)   Pulse 71   Temp (!) 97.5 F (36.4 C) (Temporal)   Ht 5\' 9"  (1.753 m)   Wt 180 lb 7 oz (81.8 kg)   SpO2 98%   BMI 26.65 kg/m    CC: 3 mo f/u visit  Subjective:    Patient ID: Mark Davidson, male    DOB: March 20, 1951, 69 y.o.   MRN: [id number]  HPI: Revere Wheeling is a 69 y.o. male presenting on 08/27/2019 for Follow-up (Here for 3-4 mo f/u.)   GI issues attributed to carvedilol - improved off med.  Continues itching and continued nagging cough. Allegra has helped this.  R hip starting to hurt more - also notes L shoulder discomfort.   HTN - Compliant with current antihypertensive regimen of losartan hctz 100/25 daily. Does check blood pressures at home: [id number] systolic. dull frontal headache at night time - stared with allergy season. Allergy relief medicine helps. No low blood pressure readings or symptoms of dizziness/syncope. Denies vision changes, CP/tightness, SOB, leg swelling.   DM - does not regularly check sugars - no glucometer. Compliant with antihyperglycemic regimen which includes: metformin 500mg  daily. Denies low sugars or hypoglycemic symptoms. Denies paresthesias. Last diabetic eye exam 09/2018. Pneumovax: 08/2017. DSME: declines today. Lab Results  Component Value Date   HGBA1C 6.7 (A) 08/27/2019   Diabetic Foot Exam - Simple   No data filed     Lab Results  Component Value Date   MICROALBUR <0.7 08/13/2016        Relevant past medical, surgical, family and social history reviewed and updated as indicated. Interim medical history since our last visit reviewed. Allergies and medications reviewed and updated. Outpatient Medications Prior to Visit  Medication Sig Dispense Refill  . atorvastatin (LIPITOR) 40 MG tablet Take 1 tablet (40 mg total) by mouth daily. 90 tablet 3  . Chlorpheniramine Maleate (ALLERGY RELIEF PO) Take by  mouth as needed. OTC    . diphenhydramine-acetaminophen (TYLENOL PM) 25-500 MG TABS Take 1 tablet by mouth at bedtime.     Marland Kitchen losartan-hydrochlorothiazide (HYZAAR) 100-25 MG tablet Take 1 tablet by mouth daily. 90 tablet 3  . metFORMIN (GLUCOPHAGE) 500 MG tablet Take 1 tablet (500 mg total) by mouth daily with breakfast. 90 tablet 3  . omeprazole (PRILOSEC) 20 MG capsule Take 1 capsule (20 mg total) by mouth daily as needed (GERD).    . fluticasone (FLONASE) 50 MCG/ACT nasal spray Place 2 sprays into both nostrils daily. (Patient taking differently: Place 2 sprays into both nostrils as needed. ) 16 g 6  . carvedilol (COREG) 3.125 MG tablet Take 1 tablet (3.125 mg total) by mouth 2 (two) times daily with a meal. (Patient taking differently: Take 3.125 mg by mouth daily. ) 60 tablet 3   No facility-administered medications prior to visit.     Per HPI unless specifically indicated in ROS section below Review of Systems Objective:  BP 140/60 (BP Location: Left Arm, Patient Position: Sitting, Cuff Size: Normal)   Pulse 71   Temp (!) 97.5 F (36.4 C) (Temporal)   Ht 5\' 9"  (1.753 m)   Wt 180 lb 7 oz (81.8 kg)   SpO2 98%   BMI 26.65 kg/m   Wt Readings from Last 3 Encounters:  08/27/19 180 lb 7 oz (81.8 kg)  05/27/19 176 lb 3.2 oz (79.9 kg)  02/16/19 177 lb 3 oz (  80.4 kg)      Physical Exam Vitals and nursing note reviewed.  Constitutional:      General: He is not in acute distress.    Appearance: He is well-developed.  HENT:     Nose: Nose normal. No congestion.     Right Turbinates: Pale.     Left Turbinates: Pale.     Mouth/Throat:     Lips: Pink.     Pharynx: Oropharynx is clear. No oropharyngeal exudate or posterior oropharyngeal erythema.  Eyes:     General: No scleral icterus.    Conjunctiva/sclera: Conjunctivae normal.     Pupils: Pupils are equal, round, and reactive to light.  Cardiovascular:     Rate and Rhythm: Normal rate and regular rhythm.     Heart sounds: Normal  heart sounds. No murmur.  Pulmonary:     Effort: Pulmonary effort is normal. No respiratory distress.     Breath sounds: Normal breath sounds. No wheezing or rales.  Musculoskeletal:     Cervical back: Normal range of motion and neck supple.     Right lower leg: No edema.     Left lower leg: No edema.     Comments:  See HPI for foot exam if done FROM cervical neck, without discomfort FROM shoulders with some discomfort on left. R shoulder WNL L shoulder exam: No deformity of shoulders on inspection. Discomfort to palpation inferior to [facility] joint as well as anterior shoulder/upper arm  No pain or weakness with testing SITS in ext/int rotation. No pain with empty can sign. Discomfort with Speed test. No impingement. No pain with crossover test. No pain with rotation of humeral head in Rich joint.  FROM at R hip, no pain at R sciatic notch or SIJ. Discomfort posterior to R trochanteric bursa  Lymphadenopathy:     Cervical: No cervical adenopathy.  Skin:    General: Skin is warm and dry.     Findings: No rash.  Neurological:     Comments: 5/5 strength BUE, BLE  Psychiatric:        Mood and Affect: Mood normal.        Behavior: Behavior normal.       Results for orders placed or performed in visit on 08/27/19  POCT glycosylated hemoglobin (Hb A1C)  Result Value Ref Range   Hemoglobin A1C 6.7 (A) 4.0 - 5.6 %   HbA1c POC (<> result, manual entry)     HbA1c, POC (prediabetic range)     HbA1c, POC (controlled diabetic range)     Assessment & Plan:  This visit occurred during the SARS-CoV-2 public health emergency.  Safety protocols were in place, including screening questions prior to the visit, additional usage of staff PPE, and extensive cleaning of exam room while observing appropriate contact time as indicated for disinfecting solutions.   Problem List Items Addressed This Visit    Sinus headache    Improved with allegra - rec restart flonase during allergy season.        Scalp pruritus    Discussed scalp care - rec hat use when in sun.      Hypertension    Chronic, stable. Intolerant to several antihypertensives in the past      Hyperlipidemia associated with type 2 diabetes mellitus ([place])    Tolerating statin - continue.       Diabetes mellitus type 2, controlled, without complications ([place]) - Primary    Chronic, stable. Continue current regimen.  I asked him to verify  preferred glucometer brand with insurance.       Relevant Orders   POCT glycosylated hemoglobin (Hb A1C) (Completed)   Cough    Lungs clear today.       Chronic left shoulder pain    1-2/10 discomfort. Possibly residual biceps tendinopathy. Rec turmeric, sparing ibuprofen. Update if not improving.       Bursitis of right hip    Exam most consistent with R hip bursitis - provided with exercises from [facility] pt advisor. Discussed turmeric as anti inflammatory supplement.       Allergic rhinitis    rec restart flonase during allergy season.          Meds ordered this encounter  Medications  . fluticasone (FLONASE) 50 MCG/ACT nasal spray    Sig: Place 2 sprays into both nostrils daily as needed for allergies or rhinitis.    Dispense:  16 g    Refill:  6   Orders Placed This Encounter  Procedures  . POCT glycosylated hemoglobin (Hb A1C)   Patient Instructions  Check with your insurance to see what their preferred brand is for glucose meter and let me know.  Try exercises for possible right hip bursitis  Try turmeric 1-2 times daily (herbal supplement which can help inflammation) - or try ibuprofen 400mg  with meals as needed for joint pain.  Return in 3 months for physical/wellness visit    Follow up plan: No follow-ups on file.  [name], MD

## 2019-08-27 NOTE — Assessment & Plan Note (Signed)
Improved with allegra - rec restart flonase during allergy season.

## 2019-08-27 NOTE — Assessment & Plan Note (Signed)
Exam most consistent with R hip bursitis - provided with exercises from [facility] pt advisor. Discussed turmeric as anti inflammatory supplement.

## 2019-08-27 NOTE — Patient Instructions (Addendum)
Check with your insurance to see what their preferred brand is for glucose meter and let me know.  Try exercises for possible right hip bursitis  Try turmeric 1-2 times daily (herbal supplement which can help inflammation) - or try ibuprofen 400mg  with meals as needed for joint pain.  Return in 3 months for physical/wellness visit

## 2019-08-27 NOTE — Assessment & Plan Note (Signed)
Chronic, stable. Intolerant to several antihypertensives in the past

## 2019-08-27 NOTE — Assessment & Plan Note (Signed)
Lungs clear today.

## 2019-08-27 NOTE — Assessment & Plan Note (Signed)
Tolerating statin - continue.

## 2019-08-27 NOTE — Assessment & Plan Note (Signed)
Chronic, stable. Continue current regimen.  I asked him to verify preferred glucometer brand with insurance.

## 2019-08-27 NOTE — Assessment & Plan Note (Signed)
Discussed scalp care - rec hat use when in sun.

## 2019-08-27 NOTE — Assessment & Plan Note (Addendum)
1-2/10 discomfort. Possibly residual biceps tendinopathy. Rec turmeric, sparing ibuprofen. Update if not improving.

## 2019-08-27 NOTE — Assessment & Plan Note (Signed)
rec restart flonase during allergy season.

## 2019-10-28 ENCOUNTER — Other Ambulatory Visit: Payer: Self-pay | Admitting: Family Medicine

## 2019-11-12 ENCOUNTER — Other Ambulatory Visit: Payer: Self-pay | Admitting: Family Medicine

## 2019-11-16 ENCOUNTER — Other Ambulatory Visit: Payer: Self-pay | Admitting: Family Medicine

## 2019-11-16 DIAGNOSIS — Z125 Encounter for screening for malignant neoplasm of prostate: Secondary | ICD-10-CM

## 2019-11-16 DIAGNOSIS — E1169 Type 2 diabetes mellitus with other specified complication: Secondary | ICD-10-CM

## 2019-11-17 ENCOUNTER — Other Ambulatory Visit (INDEPENDENT_AMBULATORY_CARE_PROVIDER_SITE_OTHER): Payer: Medicare Other

## 2019-11-17 ENCOUNTER — Telehealth: Payer: Self-pay

## 2019-11-17 ENCOUNTER — Other Ambulatory Visit: Payer: Self-pay

## 2019-11-17 ENCOUNTER — Ambulatory Visit: Payer: Medicare Other

## 2019-11-17 DIAGNOSIS — E785 Hyperlipidemia, unspecified: Secondary | ICD-10-CM

## 2019-11-17 DIAGNOSIS — E1169 Type 2 diabetes mellitus with other specified complication: Secondary | ICD-10-CM | POA: Diagnosis not present

## 2019-11-17 DIAGNOSIS — Z125 Encounter for screening for malignant neoplasm of prostate: Secondary | ICD-10-CM

## 2019-11-17 LAB — LIPID PANEL
Cholesterol: 130 mg/dL (ref 0–200)
HDL: 41.4 mg/dL (ref >39.00)
LDL Cholesterol: 68 mg/dL (ref 0–99)
NonHDL: 88.82
Total CHOL/HDL Ratio: 3
Triglycerides: 102 mg/dL (ref 0.0–149.0)
VLDL: 20.4 mg/dL (ref 0.0–40.0)

## 2019-11-17 LAB — HEMOGLOBIN A1C: Hgb A1c MFr Bld: 6.7 % — ABNORMAL HIGH (ref 4.6–6.5)

## 2019-11-17 LAB — COMPREHENSIVE METABOLIC PANEL
ALT: 22 U/L (ref 0–53)
AST: 14 U/L (ref 0–37)
Albumin: 4.2 g/dL (ref 3.5–5.2)
Alkaline Phosphatase: 75 U/L (ref 39–117)
BUN: 24 mg/dL — ABNORMAL HIGH (ref 6–23)
CO2: 28 mEq/L (ref 19–32)
Calcium: 9.5 mg/dL (ref 8.4–10.5)
Chloride: 102 mEq/L (ref 96–112)
Creatinine, Ser: 1.21 mg/dL (ref 0.40–1.50)
GFR: 59.84 mL/min — ABNORMAL LOW (ref >60.00)
Glucose, Bld: 149 mg/dL — ABNORMAL HIGH (ref 70–99)
Potassium: 4 mEq/L (ref 3.5–5.1)
Sodium: 139 mEq/L (ref 135–145)
Total Bilirubin: 0.7 mg/dL (ref 0.2–1.2)
Total Protein: 6.6 g/dL (ref 6.0–8.3)

## 2019-11-17 LAB — PSA, MEDICARE: PSA: 1.44 ng/ml (ref 0.10–4.00)

## 2019-11-17 NOTE — Telephone Encounter (Signed)
Called patient 3 times to complete his Medicare visit. Patient never answered. Left message asking patient to call and reschedule or complete at his upcoming physical. Appointment cancelled.

## 2019-11-24 ENCOUNTER — Ambulatory Visit (INDEPENDENT_AMBULATORY_CARE_PROVIDER_SITE_OTHER): Payer: Medicare Other | Admitting: Family Medicine

## 2019-11-24 ENCOUNTER — Encounter: Payer: Self-pay | Admitting: Family Medicine

## 2019-11-24 ENCOUNTER — Other Ambulatory Visit: Payer: Self-pay

## 2019-11-24 VITALS — BP 122/60 | HR 67 | Temp 96.9°F | Ht 69.25 in | Wt 172.4 lb

## 2019-11-24 DIAGNOSIS — E1169 Type 2 diabetes mellitus with other specified complication: Secondary | ICD-10-CM

## 2019-11-24 DIAGNOSIS — Z7189 Other specified counseling: Secondary | ICD-10-CM

## 2019-11-24 DIAGNOSIS — J301 Allergic rhinitis due to pollen: Secondary | ICD-10-CM

## 2019-11-24 DIAGNOSIS — E785 Hyperlipidemia, unspecified: Secondary | ICD-10-CM

## 2019-11-24 DIAGNOSIS — I10 Essential (primary) hypertension: Secondary | ICD-10-CM

## 2019-11-24 DIAGNOSIS — E119 Type 2 diabetes mellitus without complications: Secondary | ICD-10-CM

## 2019-11-24 DIAGNOSIS — K219 Gastro-esophageal reflux disease without esophagitis: Secondary | ICD-10-CM

## 2019-11-24 DIAGNOSIS — Z0000 Encounter for general adult medical examination without abnormal findings: Secondary | ICD-10-CM

## 2019-11-24 DIAGNOSIS — I6523 Occlusion and stenosis of bilateral carotid arteries: Secondary | ICD-10-CM

## 2019-11-24 DIAGNOSIS — R059 Cough, unspecified: Secondary | ICD-10-CM

## 2019-11-24 DIAGNOSIS — Z720 Tobacco use: Secondary | ICD-10-CM

## 2019-11-24 MED ORDER — LOSARTAN POTASSIUM-HCTZ 100-25 MG PO TABS: 1.0000 | ORAL_TABLET | Freq: Every day | ORAL | 3 refills | Status: DC

## 2019-11-24 MED ORDER — METFORMIN HCL 500 MG PO TABS: 500.0000 mg | ORAL_TABLET | Freq: Every day | ORAL | 3 refills | Status: DC

## 2019-11-24 MED ORDER — OMEPRAZOLE 20 MG PO CPDR: 20.0000 mg | DELAYED_RELEASE_CAPSULE | Freq: Every day | ORAL | 3 refills | Status: DC

## 2019-11-24 MED ORDER — ATORVASTATIN CALCIUM 40 MG PO TABS: 40.0000 mg | ORAL_TABLET | Freq: Every day | ORAL | 3 refills | Status: DC

## 2019-11-24 NOTE — Assessment & Plan Note (Signed)
Advanced planning - does not have at home. Would want wife to be HCPOA. Packet provided last year - encouraged he continue working on this.

## 2019-11-24 NOTE — Assessment & Plan Note (Signed)

## 2019-11-24 NOTE — Assessment & Plan Note (Signed)
Encouraged full cessation.  ?

## 2019-11-24 NOTE — Patient Instructions (Addendum)
We will sign you up for a cologuard for colon cancer screening - expect kit in the mail.  Try cutting omeprazole down to every other day - want to find least amount of medicine to do the job.  Consider shingrix vaccines.  Return as needed or in 6 months for diabetes follow up visit.  Increase water intake for kidneys.   Health Maintenance After Age 67 After age 52, you are at a higher risk for certain long-term diseases and infections as well as injuries from falls. Falls are a major cause of broken bones and head injuries in people who are older than age 20. Getting regular preventive care can help to keep you healthy and well. Preventive care includes getting regular testing and making lifestyle changes as recommended by your health care provider. Talk with your health care provider about:  Which screenings and tests you should have. A screening is a test that checks for a disease when you have no symptoms.  A diet and exercise plan that is right for you. What should I know about screenings and tests to prevent falls? Screening and testing are the best ways to find a health problem early. Early diagnosis and treatment give you the best chance of managing medical conditions that are common after age 67. Certain conditions and lifestyle choices may make you more likely to have a fall. Your health care provider may recommend:  Regular vision checks. Poor vision and conditions such as cataracts can make you more likely to have a fall. If you wear glasses, make sure to get your prescription updated if your vision changes.  Medicine review. Work with your health care provider to regularly review all of the medicines you are taking, including over-the-counter medicines. Ask your health care provider about any side effects that may make you more likely to have a fall. Tell your health care provider if any medicines that you take make you feel dizzy or sleepy.  Osteoporosis screening. Osteoporosis is a  condition that causes the bones to get weaker. This can make the bones weak and cause them to break more easily.  Blood pressure screening. Blood pressure changes and medicines to control blood pressure can make you feel dizzy.  Strength and balance checks. Your health care provider may recommend certain tests to check your strength and balance while standing, walking, or changing positions.  Foot health exam. Foot pain and numbness, as well as not wearing proper footwear, can make you more likely to have a fall.  Depression screening. You may be more likely to have a fall if you have a fear of falling, feel emotionally low, or feel unable to do activities that you used to do.  Alcohol use screening. Using too much alcohol can affect your balance and may make you more likely to have a fall. What actions can I take to lower my risk of falls? General instructions  Talk with your health care provider about your risks for falling. Tell your health care provider if: ? You fall. Be sure to tell your health care provider about all falls, even ones that seem minor. ? You feel dizzy, sleepy, or off-balance.  Take over-the-counter and prescription medicines only as told by your health care provider. These include any supplements.  Eat a healthy diet and maintain a healthy weight. A healthy diet includes low-fat dairy products, low-fat (lean) meats, and fiber from whole grains, beans, and lots of fruits and vegetables. Home safety  Remove any tripping hazards, such  as rugs, cords, and clutter.  Install safety equipment such as grab bars in bathrooms and safety rails on stairs.  Keep rooms and walkways well-lit. Activity   Follow a regular exercise program to stay fit. This will help you maintain your balance. Ask your health care provider what types of exercise are appropriate for you.  If you need a cane or walker, use it as recommended by your health care provider.  Wear supportive shoes  that have nonskid soles. Lifestyle  Do not drink alcohol if your health care provider tells you not to drink.  If you drink alcohol, limit how much you have: ? 0-1 drink a day for women. ? 0-2 drinks a day for men.  Be aware of how much alcohol is in your drink. In the [place] one drink equals one typical bottle of beer (12 oz), one-half glass of wine (5 oz), or one shot of hard liquor (1 oz).  Do not use any products that contain nicotine or tobacco, such as cigarettes and e-cigarettes. If you need help quitting, ask your health care provider. Summary  Having a healthy lifestyle and getting preventive care can help to protect your health and wellness after age 44.  Screening and testing are the best way to find a health problem early and help you avoid having a fall. Early diagnosis and treatment give you the best chance for managing medical conditions that are more common for people who are older than age 41.  Falls are a major cause of broken bones and head injuries in people who are older than age 85. Take precautions to prevent a fall at home.  Work with your health care provider to learn what changes you can make to improve your health and wellness and to prevent falls. This information is not intended to replace advice given to you by your health care provider. Make sure you discuss any questions you have with your health care provider. Document Revised: 08/06/2018 Document Reviewed: 02/26/2017 Elsevier Patient Education  2020 [employer].

## 2019-11-24 NOTE — Assessment & Plan Note (Signed)
Overdue for carotid US f/u - will order.

## 2019-11-24 NOTE — Assessment & Plan Note (Signed)
Chronic, improvement with regular antihistamine use.

## 2019-11-24 NOTE — Assessment & Plan Note (Signed)
No significant GER symptoms for several years - discussed dropping omeprazole 20mg  dosing to QOD

## 2019-11-24 NOTE — Assessment & Plan Note (Signed)
Preventative protocols reviewed and updated unless pt declined. Discussed healthy diet and lifestyle.  

## 2019-11-24 NOTE — Assessment & Plan Note (Signed)
Chronic, stable. Continue current regimen.  Monitor kidney function. Encouraged increased water intake for kidney health.

## 2019-11-24 NOTE — Assessment & Plan Note (Signed)
Chronic, stable on low dose metformin - continue. 

## 2019-11-24 NOTE — Progress Notes (Signed)
This visit was conducted in person.  BP (!) 122/60   Pulse 67   Temp (!) 96.9 F (36.1 C) (Temporal)   Ht 5' 9.25" (1.759 m)   Wt 172 lb 7 oz (78.2 kg)   SpO2 99%   BMI 25.28 kg/m   BP Readings from Last 3 Encounters:  11/24/19 (!) 122/60  08/27/19 140/60  05/27/19 140/62   CC: CPE Subjective:    Patient ID: Mark Davidson, male    DOB: 1950-10-12, 69 y.o.   MRN: [id number]  HPI: Mark Davidson is a 69 y.o. male presenting on 11/24/2019 for Medicare Wellness (part 2 )   Did not see health advisor.    Hearing Screening   Method: Audiometry   125Hz  250Hz  500Hz  1000Hz  2000Hz  3000Hz  4000Hz  6000Hz  8000Hz   Right ear:   0 40 40  0    Left ear:   20 20 20   40      Visual Acuity Screening   Right eye Left eye Both eyes  Without correction:     With correction: 20/25 20/20 20/13   Chronic R hearing loss   [facility] Visit from [facility] Total Score 0      Fall Risk  11/24/2019 10/02/2018  Falls in the past year? 0 0    GI upset to carvedilol, better off medication.   Preventative: Colon cancer screening -iFOB neg 09/2018, agrees to Cologuard.  Prostate cancer screening - discussed. Requests checking today. nocturia x3-4/night.  No fmhx AAA.  Flu shot yearly  Tetanus - 2012  Pneumovax 08/2017, prevnar 09/2018 COVID vaccine - completed Moderna 06/2019 Shingrix - interested  Advanced planning - does not have at home. Would want wife to be HCPOA. Packet provided last year - encouraged he continue working on this.  Seat belt use discussed Sunscreen use discussed. No changing moles on skin. Noted spot on right buttock - scheduled derm eval 01/2020  Non smoker, he does dip  Alcohol - none - quit 2021 Eye exam - yearly Dentist - seeing next month Bowels - no constipation Bladder - no incontinence  Caffeine: 4-5 sodas/day, 1 cup coffee/day Lives with wife, 1 dog. No children Occupation: Administrator at [employer]:  G[employer] Activity: stays active, mowing grass, walking and lifting weights. Diet: poor fruits and vegetables, good water intake. Lots of red meat     Relevant past medical, surgical, family and social history reviewed and updated as indicated. Interim medical history since our last visit reviewed. Allergies and medications reviewed and updated. Outpatient Medications Prior to Visit  Medication Sig Dispense Refill  . Chlorpheniramine Maleate (ALLERGY RELIEF PO) Take by mouth as needed. OTC    . diphenhydramine-acetaminophen (TYLENOL PM) 25-500 MG TABS Take 1 tablet by mouth at bedtime.     . fluticasone (FLONASE) 50 MCG/ACT nasal spray Place 2 sprays into both nostrils daily as needed for allergies or rhinitis. 16 g 6  . atorvastatin (LIPITOR) 40 MG tablet TAKE 1 TABLET BY MOUTH ONCE DAILY 90 tablet 0  . losartan-hydrochlorothiazide (HYZAAR) 100-25 MG tablet Take 1 tablet by mouth daily. 90 tablet 3  . metFORMIN (GLUCOPHAGE) 500 MG tablet TAKE ONE TABLET BY MOUTH DAILY WITH BREAKFAST 90 tablet 0  . omeprazole (PRILOSEC) 20 MG capsule Take 1 capsule (20 mg total) by mouth daily as needed (GERD).     No facility-administered medications prior to visit.     Per HPI unless specifically indicated in ROS section below  Review of Systems  Constitutional: Negative for activity change, appetite change, chills, fatigue, fever and unexpected weight change.  HENT: Negative for hearing loss.   Eyes: Negative for visual disturbance.  Respiratory: Positive for cough (allergy pill helps chronic cough). Negative for chest tightness, shortness of breath and wheezing.   Cardiovascular: Negative for chest pain, palpitations and leg swelling.  Gastrointestinal: Positive for nausea (intermittent, 2-3x/wk worse at night). Negative for abdominal distention, abdominal pain, blood in stool, constipation, diarrhea and vomiting.  Genitourinary: Negative for difficulty urinating and hematuria.  Musculoskeletal: Negative  for arthralgias, myalgias and neck pain.  Skin: Negative for rash.  Neurological: Negative for dizziness, seizures, syncope and headaches.  Hematological: Negative for adenopathy. Does not bruise/bleed easily.  Psychiatric/Behavioral: Negative for dysphoric mood. The patient is not nervous/anxious.    Objective:  BP (!) 122/60   Pulse 67   Temp (!) 96.9 F (36.1 C) (Temporal)   Ht 5' 9.25" (1.759 m)   Wt 172 lb 7 oz (78.2 kg)   SpO2 99%   BMI 25.28 kg/m   Wt Readings from Last 3 Encounters:  11/24/19 172 lb 7 oz (78.2 kg)  08/27/19 180 lb 7 oz (81.8 kg)  05/27/19 176 lb 3.2 oz (79.9 kg)      Physical Exam Vitals and nursing note reviewed.  Constitutional:      General: He is not in acute distress.    Appearance: Normal appearance. He is well-developed. He is not ill-appearing.  HENT:     Head: Normocephalic and atraumatic.     Right Ear: Hearing, tympanic membrane, ear canal and external ear normal.     Left Ear: Hearing, tympanic membrane, ear canal and external ear normal.  Eyes:     General: No scleral icterus.    Conjunctiva/sclera: Conjunctivae normal.     Pupils: Pupils are equal, round, and reactive to light.  Neck:     Thyroid: No thyroid mass, thyromegaly or thyroid tenderness.     Vascular: No carotid bruit.  Cardiovascular:     Rate and Rhythm: Normal rate and regular rhythm.     Pulses: Normal pulses.          Radial pulses are 2+ on the right side and 2+ on the left side.     Heart sounds: Normal heart sounds. No murmur heard.   Pulmonary:     Effort: Pulmonary effort is normal. No respiratory distress.     Breath sounds: Normal breath sounds. No wheezing, rhonchi or rales.  Abdominal:     General: Abdomen is flat. Bowel sounds are normal. There is no distension.     Palpations: Abdomen is soft. There is no mass.     Tenderness: There is no abdominal tenderness. There is no guarding or rebound.     Hernia: No hernia is present.  Musculoskeletal:         General: Normal range of motion.     Cervical back: Normal range of motion and neck supple.     Right lower leg: No edema.     Left lower leg: No edema.  Lymphadenopathy:     Cervical: No cervical adenopathy.  Skin:    General: Skin is warm and dry.     Findings: No rash.  Neurological:     General: No focal deficit present.     Mental Status: He is alert and oriented to person, place, and time.     Comments:  CN grossly intact, station and gait intact Recall 2/3,  2/3 with cue Calculation 3/5 DLORW  Psychiatric:        Mood and Affect: Mood normal.        Behavior: Behavior normal.        Thought Content: Thought content normal.        Judgment: Judgment normal.       Results for orders placed or performed in visit on 11/17/19  PSA, Medicare  Result Value Ref Range   PSA 1.44 0.10 - 4.00 ng/ml  Hemoglobin A1c  Result Value Ref Range   Hgb A1c MFr Bld 6.7 (H) 4.6 - 6.5 %  Comprehensive metabolic panel  Result Value Ref Range   Sodium 139 135 - 145 mEq/L   Potassium 4.0 3.5 - 5.1 mEq/L   Chloride 102 96 - 112 mEq/L   CO2 28 19 - 32 mEq/L   Glucose, Bld 149 (H) 70 - 99 mg/dL   BUN 24 (H) 6 - 23 mg/dL   Creatinine, Ser 1.21 0.40 - 1.50 mg/dL   Total Bilirubin 0.7 0.2 - 1.2 mg/dL   Alkaline Phosphatase 75 39 - 117 U/L   AST 14 0 - 37 U/L   ALT 22 0 - 53 U/L   Total Protein 6.6 6.0 - 8.3 g/dL   Albumin 4.2 3.5 - 5.2 g/dL   GFR 59.84 (L) >60.00 mL/min   Calcium 9.5 8.4 - 10.5 mg/dL  Lipid panel  Result Value Ref Range   Cholesterol 130 0 - 200 mg/dL   Triglycerides 102.0 0 - 149 mg/dL   HDL 41.40 >39.00 mg/dL   VLDL 20.4 0.0 - 40.0 mg/dL   LDL Cholesterol 68 0 - 99 mg/dL   Total CHOL/HDL Ratio 3    NonHDL 88.82    Assessment & Plan:  This visit occurred during the SARS-CoV-2 public health emergency.  Safety protocols were in place, including screening questions prior to the visit, additional usage of staff PPE, and extensive cleaning of exam room while observing  appropriate contact time as indicated for disinfecting solutions.   Problem List Items Addressed This Visit    Smokeless tobacco use    Encouraged full cessation.       Medicare annual wellness visit, initial - Primary    I have personally reviewed the Medicare Annual Wellness questionnaire and have noted 1. The patient's medical and social history 2. Their use of alcohol, tobacco or illicit drugs 3. Their current medications and supplements 4. The patient's functional ability including ADL's, fall risks, home safety risks and hearing or visual impairment. Cognitive function has been assessed and addressed as indicated.  5. Diet and physical activity 6. Evidence for depression or mood disorders The patients weight, height, BMI have been recorded in the chart. I have made referrals, counseling and provided education to the patient based on review of the above and I have provided the pt with a written personalized care plan for preventive services. Provider list updated.. See scanned questionairre as needed for further documentation. Reviewed preventative protocols and updated unless pt declined.       Hypertension    Chronic, stable. Continue current regimen.  Monitor kidney function. Encouraged increased water intake for kidney health.       Relevant Medications   atorvastatin (LIPITOR) 40 MG tablet   losartan-hydrochlorothiazide (HYZAAR) 100-25 MG tablet   Hyperlipidemia associated with type 2 diabetes mellitus (HCC)    Chronic, stable. Continue current regimen.  The 10-year ASCVD risk score Mikey Bussing DC Brooke Bonito., et al., 2013) is: 22.4%  Values used to calculate the score:     Age: 73 years     Sex: Male     Is Non-Hispanic African American: No     Diabetic: Yes     Tobacco smoker: No     Systolic Blood Pressure: 283 mmHg     Is BP treated: Yes     HDL Cholesterol: 41.4 mg/dL     Total Cholesterol: 130 mg/dL       Relevant Medications   atorvastatin (LIPITOR) 40 MG tablet    losartan-hydrochlorothiazide (HYZAAR) 100-25 MG tablet   metFORMIN (GLUCOPHAGE) 500 MG tablet   Health maintenance examination    Preventative protocols reviewed and updated unless pt declined. Discussed healthy diet and lifestyle.       GERD (gastroesophageal reflux disease)    No significant GER symptoms for several years - discussed dropping omeprazole 73m dosing to QOD       Relevant Medications   omeprazole (PRILOSEC) 20 MG capsule   Diabetes mellitus type 2, controlled, without complications (HCC)    Chronic, stable on low dose metformin - continue.      Relevant Medications   atorvastatin (LIPITOR) 40 MG tablet   losartan-hydrochlorothiazide (HYZAAR) 100-25 MG tablet   metFORMIN (GLUCOPHAGE) 500 MG tablet   Cough    Chronic, improvement with regular antihistamine use.       Bilateral carotid artery stenosis    Overdue for carotid [place]/u - will order.       Relevant Medications   atorvastatin (LIPITOR) 40 MG tablet   losartan-hydrochlorothiazide (HYZAAR) 100-25 MG tablet   Other Relevant Orders   VAS [place]OTID   Allergic rhinitis   Advanced care planning/counseling discussion    Advanced planning - does not have at home. Would want wife to be HCPOA. Packet provided last year - encouraged he continue working on this.           Meds ordered this encounter  Medications  . atorvastatin (LIPITOR) 40 MG tablet    Sig: Take 1 tablet (40 mg total) by mouth daily.    Dispense:  90 tablet    Refill:  3  . losartan-hydrochlorothiazide (HYZAAR) 100-25 MG tablet    Sig: Take 1 tablet by mouth daily.    Dispense:  90 tablet    Refill:  3  . metFORMIN (GLUCOPHAGE) 500 MG tablet    Sig: Take 1 tablet (500 mg total) by mouth daily with breakfast.    Dispense:  90 tablet    Refill:  3  . omeprazole (PRILOSEC) 20 MG capsule    Sig: Take 1 capsule (20 mg total) by mouth daily.    Dispense:  90 capsule    Refill:  3   No orders of the defined types were placed in this  encounter.   Patient instructions: We will sign you up for a cologuard for colon cancer screening - expect kit in the mail.  Try cutting omeprazole down to every other day - want to find least amount of medicine to do the job.  Consider shingrix vaccines.  Increase water intake for kidneys.  Return as needed or in 6 months for diabetes follow up visit.   Follow up plan: Return in about 6 months (around 05/26/2020) for follow up visit.  JRia Bush MD

## 2019-11-24 NOTE — Assessment & Plan Note (Signed)
Chronic, stable. Continue current regimen.  The 10-year ASCVD risk score Mikey Bussing DC Brooke Bonito., et al., 2013) is: 22.4%   Values used to calculate the score:     Age: 69 years     Sex: Male     Is Non-Hispanic African American: No     Diabetic: Yes     Tobacco smoker: No     Systolic Blood Pressure: 735 mmHg     Is BP treated: Yes     HDL Cholesterol: 41.4 mg/dL     Total Cholesterol: 130 mg/dL

## 2019-12-27 DIAGNOSIS — E119 Type 2 diabetes mellitus without complications: Secondary | ICD-10-CM | POA: Diagnosis not present

## 2019-12-27 LAB — HM DIABETES EYE EXAM

## 2019-12-31 ENCOUNTER — Encounter: Payer: Self-pay | Admitting: Family Medicine

## 2020-02-02 ENCOUNTER — Ambulatory Visit: Payer: Medicare Other | Admitting: Dermatology

## 2020-02-02 ENCOUNTER — Other Ambulatory Visit: Payer: Self-pay

## 2020-02-02 DIAGNOSIS — D692 Other nonthrombocytopenic purpura: Secondary | ICD-10-CM | POA: Diagnosis not present

## 2020-02-02 DIAGNOSIS — L578 Other skin changes due to chronic exposure to nonionizing radiation: Secondary | ICD-10-CM | POA: Diagnosis not present

## 2020-02-02 DIAGNOSIS — L219 Seborrheic dermatitis, unspecified: Secondary | ICD-10-CM

## 2020-02-02 DIAGNOSIS — L820 Inflamed seborrheic keratosis: Secondary | ICD-10-CM | POA: Diagnosis not present

## 2020-02-02 DIAGNOSIS — L570 Actinic keratosis: Secondary | ICD-10-CM

## 2020-02-02 DIAGNOSIS — L814 Other melanin hyperpigmentation: Secondary | ICD-10-CM

## 2020-02-02 DIAGNOSIS — L821 Other seborrheic keratosis: Secondary | ICD-10-CM

## 2020-02-02 MED ORDER — MOMETASONE FUROATE 0.1 % EX SOLN: Freq: Every day | CUTANEOUS | 3 refills | Status: DC

## 2020-02-02 MED ORDER — KETOCONAZOLE 2 % EX SHAM: 1.0000 "application " | MEDICATED_SHAMPOO | CUTANEOUS | 3 refills | Status: DC

## 2020-02-02 NOTE — Progress Notes (Signed)
   New Patient Visit  Subjective  Mark Davidson is a 69 y.o. male who presents for the following: Rash (Itching and sores of scalp x a couple months. He has tried tea tree oil but that did not help).  The following portions of the chart were reviewed this encounter and updated as appropriate:  Tobacco  Allergies  Meds  Problems  Med Hx  Surg Hx  Fam Hx     Review of Systems:  No other skin or systemic complaints except as noted in HPI or Assessment and Plan.  Objective  Well appearing patient in no apparent distress; mood and affect are within normal limits.  A focused examination was performed including scalp, face, arms. Relevant physical exam findings are noted in the Assessment and Plan.  Objective  Vertex scalp x 1, left ear x 1 (2): Erythematous thin papules/macules with gritty scale.   Objective  Scalp: No scale or erythema today.  Objective  Right Forearm - Posterior: Erythematous keratotic or waxy stuck-on papule or plaque.    Assessment & Plan  AK (actinic keratosis) (2) Vertex scalp x 1, left ear x 1  Destruction of lesion - Vertex scalp x 1, left ear x 1 Complexity: simple   Destruction method: cryotherapy   Informed consent: discussed and consent obtained   Timeout:  patient name, date of birth, surgical site, and procedure verified Lesion destroyed using liquid nitrogen: Yes   Region frozen until ice ball extended beyond lesion: Yes   Outcome: patient tolerated procedure well with no complications   Post-procedure details: wound care instructions given    Seborrheic dermatitis Scalp Seborrheic Dermatitis with Pruritus Recommend shampooing scalp 7 days per week.  Start Ketoconazole 2% shampoo 3 times per week. The other days use any shampoo. Start Mometasone solution qd 4 times per week.  ketoconazole (NIZORAL) 2 % shampoo - Scalp  mometasone (ELOCON) 0.1 % lotion - Scalp  Inflamed seborrheic keratosis Right Forearm -  Posterior  Destruction of lesion - Right Forearm - Posterior Complexity: simple   Destruction method: cryotherapy   Informed consent: discussed and consent obtained   Timeout:  patient name, date of birth, surgical site, and procedure verified Lesion destroyed using liquid nitrogen: Yes   Region frozen until ice ball extended beyond lesion: Yes   Outcome: patient tolerated procedure well with no complications   Post-procedure details: wound care instructions given     Actinic Damage - diffuse scaly erythematous macules with underlying dyspigmentation - Recommend daily broad spectrum sunscreen SPF 30+ to sun-exposed areas, reapply every 2 hours as needed.  - Call for new or changing lesions.  Purpura - Violaceous macules and patches - Benign - Related to age, sun damage and/or use of blood thinners - Observe - Can use OTC arnica containing moisturizer such as Dermend Bruise Formula if desired - Call for worsening or other concerns  Lentigines - Scattered tan macules - Discussed due to sun exposure - Benign, observe - Call for any changes  Seborrheic Keratoses - Stuck-on, waxy, tan-brown papules and plaques  - Discussed benign etiology and prognosis. - Observe - Call for any changes  Return in about 6 weeks (around 03/15/2020).  I, Ashok Cordia, CMA, am acting as scribe for Sarina Ser, MD .  Documentation: I have reviewed the above documentation for accuracy and completeness, and I agree with the above.  Sarina Ser, MD

## 2020-02-02 NOTE — Patient Instructions (Signed)

## 2020-02-03 ENCOUNTER — Encounter: Payer: Self-pay | Admitting: Dermatology

## 2020-03-16 ENCOUNTER — Ambulatory Visit: Payer: Medicare Other | Admitting: Dermatology

## 2020-03-20 ENCOUNTER — Other Ambulatory Visit: Payer: Self-pay

## 2020-03-20 ENCOUNTER — Ambulatory Visit: Payer: Medicare Other | Admitting: Dermatology

## 2020-03-20 DIAGNOSIS — L814 Other melanin hyperpigmentation: Secondary | ICD-10-CM

## 2020-03-20 DIAGNOSIS — L219 Seborrheic dermatitis, unspecified: Secondary | ICD-10-CM

## 2020-03-20 DIAGNOSIS — L821 Other seborrheic keratosis: Secondary | ICD-10-CM

## 2020-03-20 DIAGNOSIS — L570 Actinic keratosis: Secondary | ICD-10-CM | POA: Diagnosis not present

## 2020-03-20 DIAGNOSIS — Z1283 Encounter for screening for malignant neoplasm of skin: Secondary | ICD-10-CM

## 2020-03-20 DIAGNOSIS — L578 Other skin changes due to chronic exposure to nonionizing radiation: Secondary | ICD-10-CM | POA: Diagnosis not present

## 2020-03-20 DIAGNOSIS — L720 Epidermal cyst: Secondary | ICD-10-CM | POA: Diagnosis not present

## 2020-03-20 MED ORDER — KETOCONAZOLE 2 % EX SHAM: 1.0000 "application " | MEDICATED_SHAMPOO | CUTANEOUS | 3 refills | Status: DC

## 2020-03-20 MED ORDER — MOMETASONE FUROATE 0.1 % EX SOLN: Freq: Every day | CUTANEOUS | 3 refills | Status: DC

## 2020-03-20 NOTE — Patient Instructions (Signed)
Cryotherapy Aftercare  . Wash gently with soap and water everyday.   . Apply Vaseline and Band-Aid daily until healed.  

## 2020-03-20 NOTE — Progress Notes (Signed)
   Follow-Up Visit   Subjective  Mark Davidson is a 69 y.o. male who presents for the following: Dermatitis (6 weeks f/u dermatitis on the scalp, treating with Mometasone lotion, Ketoconazole shampoo ) and Actinic Keratosis (6 weeks f/u hx of Aks scalp, L ear ).  The following portions of the chart were reviewed this encounter and updated as appropriate:  Tobacco  Allergies  Meds  Problems  Med Hx  Surg Hx  Fam Hx     Review of Systems:  No other skin or systemic complaints except as noted in HPI or Assessment and Plan.  Objective  Well appearing patient in no apparent distress; mood and affect are within normal limits.  A focused examination was performed including scalp, L ear . Relevant physical exam findings are noted in the Assessment and Plan.  Objective  Head - Anterior (Face): Pink patches with greasy scale.   Objective  Right scapula: 1.0 cm Subcutaneous nodule.   Objective  Scalp x 1: Erythematous thin papules/macules with gritty scale.    Assessment & Plan  Seborrheic dermatitis Head - Anterior (Face)  Chronic condition, no cure, only control. Not currently at goal.   Seborrheic Dermatitis  -  is a chronic persistent rash characterized by pinkness and scaling most commonly of the mid face but also can occur on the scalp (dandruff); mid chest and mid back. It tends to be exacerbated by stress and cooler weather.  People who have neurologic disease may experience new onset or exacerbation of existing seborrheic dermatitis.  The condition is not curable but treatable and can be controlled.     Reordered Medications ketoconazole (NIZORAL) 2 % shampoo mometasone (ELOCON) 0.1 % lotion  Epidermal cyst Right scapula  Discussed surgically removal, pt decline  AK (actinic keratosis) Scalp x 1  Destruction of lesion - Scalp x 1 Complexity: simple   Destruction method: cryotherapy   Informed consent: discussed and consent obtained   Timeout:  patient  name, date of birth, surgical site, and procedure verified Lesion destroyed using liquid nitrogen: Yes   Region frozen until ice ball extended beyond lesion: Yes   Outcome: patient tolerated procedure well with no complications   Post-procedure details: wound care instructions given    Skin cancer screening   Actinic Damage - chronic, secondary to cumulative UV radiation exposure/sun exposure over time - diffuse scaly erythematous macules with underlying dyspigmentation - Recommend daily broad spectrum sunscreen SPF 30+ to sun-exposed areas, reapply every 2 hours as needed.  - Call for new or changing lesions.  Seborrheic Keratoses - Stuck-on, waxy, tan-brown papules and plaques  - Discussed benign etiology and prognosis. - Observe - Call for any changes  Lentigines - Scattered tan macules - Discussed due to sun exposure - Benign, observe - Call for any changes  Return in about 1 year (around 03/20/2021).  IMarye Round, CMA, am acting as scribe for Sarina Ser, MD .  Documentation: I have reviewed the above documentation for accuracy and completeness, and I agree with the above.  Sarina Ser, MD

## 2020-03-21 ENCOUNTER — Encounter: Payer: Self-pay | Admitting: Dermatology

## 2020-05-26 ENCOUNTER — Other Ambulatory Visit: Payer: Self-pay

## 2020-05-29 ENCOUNTER — Other Ambulatory Visit: Payer: Self-pay

## 2020-05-29 ENCOUNTER — Encounter: Payer: Self-pay | Admitting: Family Medicine

## 2020-05-29 ENCOUNTER — Ambulatory Visit (INDEPENDENT_AMBULATORY_CARE_PROVIDER_SITE_OTHER): Payer: Medicare Other | Admitting: Family Medicine

## 2020-05-29 VITALS — BP 120/62 | HR 67 | Temp 97.6°F | Ht 69.25 in | Wt 178.4 lb

## 2020-05-29 DIAGNOSIS — E118 Type 2 diabetes mellitus with unspecified complications: Secondary | ICD-10-CM

## 2020-05-29 DIAGNOSIS — I10 Essential (primary) hypertension: Secondary | ICD-10-CM | POA: Diagnosis not present

## 2020-05-29 LAB — POCT GLYCOSYLATED HEMOGLOBIN (HGB A1C): Hemoglobin A1C: 6.7 % — AB (ref 4.0–5.6)

## 2020-05-29 NOTE — Assessment & Plan Note (Addendum)
Chronic, stable. Continue current regimen. Declines DSME.  Foot exam today.

## 2020-05-29 NOTE — Progress Notes (Signed)
Patient ID: Mark Davidson, male    DOB: 1950-12-13, 70 y.o.   MRN: [id number]  This visit was conducted in person.  BP 120/62 (BP Location: Left Arm, Patient Position: Sitting, Cuff Size: Normal)   Pulse 67   Temp 97.6 F (36.4 C) (Temporal)   Ht 5' 9.25" (1.759 m)   Wt 178 lb 6 oz (80.9 kg)   SpO2 98%   BMI 26.15 kg/m    CC: 6 mo f/u visit  Subjective:   HPI: Mark Davidson is a 70 y.o. male presenting on 05/29/2020 for Diabetes (Here for 6 mo f/u.)   Wife broke her arm after fall on ice.  Mother broke her hip after fall at home.   DM - does not regularly check sugars. Compliant with antihyperglycemic regimen which includes: metformin 500mg  daily. Denies hypoglycemic symptoms. Denies paresthesias. Last diabetic eye exam 11/2019. Pneumovax: 2019. Prevnar: 2020. Glucometer brand: doesn't have one. DSME: declines. Lab Results  Component Value Date   HGBA1C 6.7 (A) 05/29/2020   Diabetic Foot Exam - Simple   Simple Foot Form Diabetic Foot exam was performed with the following findings: Yes 05/29/2020  4:19 PM  Visual Inspection No deformities, no ulcerations, no other skin breakdown bilaterally: Yes Sensation Testing Intact to touch and monofilament testing bilaterally: Yes Pulse Check Posterior Tibialis and Dorsalis pulse intact bilaterally: Yes Comments    Lab Results  Component Value Date   MICROALBUR <0.7 08/13/2016        Relevant past medical, surgical, family and social history reviewed and updated as indicated. Interim medical history since our last visit reviewed. Allergies and medications reviewed and updated. Outpatient Medications Prior to Visit  Medication Sig Dispense Refill  . atorvastatin (LIPITOR) 40 MG tablet Take 1 tablet (40 mg total) by mouth daily. 90 tablet 3  . Chlorpheniramine Maleate (ALLERGY RELIEF PO) Take by mouth as needed. OTC    . diphenhydramine-acetaminophen (TYLENOL PM) 25-500 MG TABS Take 1 tablet by mouth at bedtime.     . fluticasone (FLONASE) 50 MCG/ACT nasal spray Place 2 sprays into both nostrils daily as needed for allergies or rhinitis. 16 g 6  . losartan-hydrochlorothiazide (HYZAAR) 100-25 MG tablet Take 1 tablet by mouth daily. 90 tablet 3  . metFORMIN (GLUCOPHAGE) 500 MG tablet Take 1 tablet (500 mg total) by mouth daily with breakfast. 90 tablet 3  . omeprazole (PRILOSEC) 20 MG capsule Take 1 capsule (20 mg total) by mouth daily. 90 capsule 3  . ketoconazole (NIZORAL) 2 % shampoo Apply 1 application topically 3 (three) times a week. (Patient not taking: Reported on 05/29/2020) 120 mL 3  . mometasone (ELOCON) 0.1 % lotion Apply topically daily. 4 days per week as needed 60 mL 3   No facility-administered medications prior to visit.     Per HPI unless specifically indicated in ROS section below Review of Systems Objective:  BP 120/62 (BP Location: Left Arm, Patient Position: Sitting, Cuff Size: Normal)   Pulse 67   Temp 97.6 F (36.4 C) (Temporal)   Ht 5' 9.25" (1.759 m)   Wt 178 lb 6 oz (80.9 kg)   SpO2 98%   BMI 26.15 kg/m   Wt Readings from Last 3 Encounters:  05/29/20 178 lb 6 oz (80.9 kg)  11/24/19 172 lb 7 oz (78.2 kg)  08/27/19 180 lb 7 oz (81.8 kg)      Physical Exam Vitals and nursing note reviewed.  Constitutional:      General: He is  not in acute distress.    Appearance: Normal appearance. He is well-developed and well-nourished. He is not ill-appearing.  HENT:     Head: Normocephalic and atraumatic.     Mouth/Throat:     Mouth: Oropharynx is clear and moist.  Eyes:     General: No scleral icterus.    Extraocular Movements: Extraocular movements intact and EOM normal.     Conjunctiva/sclera: Conjunctivae normal.     Pupils: Pupils are equal, round, and reactive to light.  Cardiovascular:     Rate and Rhythm: Normal rate and regular rhythm.     Pulses: Normal pulses and intact distal pulses.     Heart sounds: Normal heart sounds. No murmur heard.   Pulmonary:      Effort: Pulmonary effort is normal. No respiratory distress.     Breath sounds: Normal breath sounds. No wheezing, rhonchi or rales.  Musculoskeletal:        General: No edema.     Cervical back: Normal range of motion and neck supple.     Right lower leg: No edema.     Left lower leg: No edema.     Comments: See HPI for foot exam if done  Lymphadenopathy:     Cervical: No cervical adenopathy.  Skin:    General: Skin is warm and dry.     Findings: No rash.  Neurological:     Mental Status: He is alert.  Psychiatric:        Mood and Affect: Mood and affect and mood normal.        Behavior: Behavior normal.       Results for orders placed or performed in visit on 05/29/20  POCT glycosylated hemoglobin (Hb A1C)  Result Value Ref Range   Hemoglobin A1C 6.7 (A) 4.0 - 5.6 %   HbA1c POC (<> result, manual entry)     HbA1c, POC (prediabetic range)     HbA1c, POC (controlled diabetic range)     Assessment & Plan:  This visit occurred during the SARS-CoV-2 public health emergency.  Safety protocols were in place, including screening questions prior to the visit, additional usage of staff PPE, and extensive cleaning of exam room while observing appropriate contact time as indicated for disinfecting solutions.   Problem List Items Addressed This Visit    Hypertension    Chronic, stable. Continue current regimen.       Controlled diabetes mellitus type 2 with complications (HCC) - Primary    Chronic, stable. Continue current regimen. Declines DSME.  Foot exam today.       Relevant Orders   POCT glycosylated hemoglobin (Hb A1C) (Completed)       No orders of the defined types were placed in this encounter.  Orders Placed This Encounter  Procedures  . POCT glycosylated hemoglobin (Hb A1C)    Follow up plan: Return in about 6 months (around 11/26/2020) for annual exam, prior fasting for blood work, medicare wellness visit.  [name], MD

## 2020-05-29 NOTE — Assessment & Plan Note (Signed)
Chronic, stable. Continue current regimen. 

## 2020-05-29 NOTE — Patient Instructions (Addendum)
You are doing well today! Continue current medicines.  Continue watching sugar/carbs in the diet.  Return as needed or in 6 months for physical/wellness visit.

## 2020-08-17 ENCOUNTER — Telehealth: Payer: Self-pay | Admitting: Family Medicine

## 2020-08-17 NOTE — Chronic Care Management (AMB) (Signed)
  Chronic Care Management   Note  08/17/2020 Name: Arvid Marengo MRN: [id number] DOB: [birth year]  Sanjit Mcmichael is a 70 y.o. year old male who is a primary care patient of [name], MD. I reached out to [name] by phone today in response to a referral sent by Mr. Cason Luffman Murdaugh's PCP, [name], MD.   Mr. Broadwell was given information about Chronic Care Management services today including:  1. CCM service includes personalized support from designated clinical staff supervised by his physician, including individualized plan of care and coordination with other care providers 2. 24/7 contact phone numbers for assistance for urgent and routine care needs. 3. Service will only be billed when office clinical staff spend 20 minutes or more in a month to coordinate care. 4. Only one practitioner may furnish and bill the service in a calendar month. 5. The patient may stop CCM services at any time (effective at the end of the month) by phone call to the office staff.   Patient did not agree to enrollment in care management services and does not wish to consider at this time.  Follow up plan:   Lauretta Grill [employer] Scheduler

## 2020-10-09 ENCOUNTER — Ambulatory Visit (INDEPENDENT_AMBULATORY_CARE_PROVIDER_SITE_OTHER): Payer: Medicare Other | Admitting: Family Medicine

## 2020-10-09 ENCOUNTER — Other Ambulatory Visit: Payer: Self-pay

## 2020-10-09 ENCOUNTER — Encounter: Payer: Self-pay | Admitting: Family Medicine

## 2020-10-09 VITALS — BP 158/72 | HR 75 | Temp 98.2°F | Ht 69.25 in | Wt 178.3 lb

## 2020-10-09 DIAGNOSIS — M25512 Pain in left shoulder: Secondary | ICD-10-CM

## 2020-10-09 DIAGNOSIS — I10 Essential (primary) hypertension: Secondary | ICD-10-CM

## 2020-10-09 MED ORDER — METOPROLOL SUCCINATE ER 25 MG PO TB24: 25.0000 mg | ORAL_TABLET | Freq: Every day | ORAL | 6 refills | Status: DC

## 2020-10-09 MED ORDER — OMEPRAZOLE 20 MG PO CPDR
20.0000 mg | DELAYED_RELEASE_CAPSULE | Freq: Every day | ORAL | 1 refills | Status: DC
Start: 2020-10-09 — End: 2021-04-30

## 2020-10-09 MED ORDER — NAPROXEN 375 MG PO TABS: 375.0000 mg | ORAL_TABLET | Freq: Two times a day (BID) | ORAL | 0 refills | Status: DC

## 2020-10-09 NOTE — Assessment & Plan Note (Signed)
Chronic, deteriorated. ?related to increased pain from shoulder vs increased salt intake as he's been eating more convenience food over last few months (living out of apartment). I have recommended starting toprol XL 25mg  daily in addition to his hyzaar. Hypertension handout provided today, rec limit salt/sodium in diet.

## 2020-10-09 NOTE — Progress Notes (Signed)
Patient ID: Mark Davidson, male    DOB: 07/25/1950, 70 y.o.   MRN: [id number]  This visit was conducted in person.  BP (!) 158/72   Pulse 75   Temp 98.2 F (36.8 C) (Temporal)   Ht 5' 9.25" (1.759 m)   Wt 178 lb 5 oz (80.9 kg)   SpO2 98%   BMI 26.14 kg/m   BP Readings from Last 3 Encounters:  10/09/20 (!) 158/72  05/29/20 120/62  11/24/19 (!) 122/60   160s/70 on recheck   CC: L arm pain  Subjective:   HPI: Mark Davidson is a 70 y.o. male presenting on 10/09/2020 for Shoulder Pain (C/o left shoulder pain.  Started about 1 mo ago.  Pain starts just superior to posterior left elbow and radiates up into shoulder.  Tried Tylenol, helpful. )   1 mo h/o L arm pain described as pain starting posterior upper arm proximal to elbow, radiating to L shoulder at intersection with neck, as well as L pectoral region. Describes dull pain.   Recently moved out of house for renovations (06/2020). Planning to move back at end of this month.  This past weekend spent time on floor cleaning mother's floor. This was worse time he noted it.  Recurrent lifting also aggravates pain. Treated with tylenol with benefit, heating pad and ice packs with temporary benefit.  No inciting trauma/injury or fall.   BP elevated today. Continues hyzaar 100/25mg  daily. He has been eating out more lately. Has not been checking BP at home. No HA, vision changes, CP/tightness, SOB, leg swelling.      Relevant past medical, surgical, family and social history reviewed and updated as indicated. Interim medical history since our last visit reviewed. Allergies and medications reviewed and updated. Outpatient Medications Prior to Visit  Medication Sig Dispense Refill   atorvastatin (LIPITOR) 40 MG tablet Take 1 tablet (40 mg total) by mouth daily. 90 tablet 3   Chlorpheniramine Maleate (ALLERGY RELIEF PO) Take by mouth as needed. OTC     diphenhydramine-acetaminophen (TYLENOL PM) 25-500 MG TABS Take 1 tablet by  mouth at bedtime.     fluticasone (FLONASE) 50 MCG/ACT nasal spray Place 2 sprays into both nostrils daily as needed for allergies or rhinitis. 16 g 6   losartan-hydrochlorothiazide (HYZAAR) 100-25 MG tablet Take 1 tablet by mouth daily. 90 tablet 3   metFORMIN (GLUCOPHAGE) 500 MG tablet Take 1 tablet (500 mg total) by mouth daily with breakfast. 90 tablet 3   omeprazole (PRILOSEC) 20 MG capsule Take 1 capsule (20 mg total) by mouth daily. 90 capsule 3   ketoconazole (NIZORAL) 2 % shampoo Apply 1 application topically 3 (three) times a week. (Patient not taking: Reported on 05/29/2020) 120 mL 3   No facility-administered medications prior to visit.     Per HPI unless specifically indicated in ROS section below Review of Systems Objective:  BP (!) 158/72   Pulse 75   Temp 98.2 F (36.8 C) (Temporal)   Ht 5' 9.25" (1.759 m)   Wt 178 lb 5 oz (80.9 kg)   SpO2 98%   BMI 26.14 kg/m   Wt Readings from Last 3 Encounters:  10/09/20 178 lb 5 oz (80.9 kg)  05/29/20 178 lb 6 oz (80.9 kg)  11/24/19 172 lb 7 oz (78.2 kg)      Physical Exam Vitals and nursing note reviewed.  Constitutional:      Appearance: Normal appearance. He is not ill-appearing.  Cardiovascular:  Rate and Rhythm: Normal rate and regular rhythm.     Pulses: Normal pulses.     Heart sounds: Normal heart sounds. No murmur heard. Pulmonary:     Effort: Pulmonary effort is normal. No respiratory distress.     Breath sounds: Normal breath sounds. No wheezing, rhonchi or rales.  Musculoskeletal:        General: Normal range of motion.     Comments:  FROM cervical neck without midline tenderness to palpation  FROM at bilateral elbows R shoulder WNL L shoulder exam:  No deformity of shoulders on inspection. Discomfort to palpation anterior left shoulder down upper arm almost to elbow as well as mild discomfort to L pectoral muscles. Less discomfort along posterior shoulder and into neck  FROM in abduction and forward  flexion. No significant pain or weakness with testing SITS in ext/int rotation. No pain with empty can sign. Neg , Speed test. Discomfort with rotation of humeral head in [place] joint.   Neurological:     Mental Status: He is alert.      Results for orders placed or performed in visit on 05/29/20  POCT glycosylated hemoglobin (Hb A1C)  Result Value Ref Range   Hemoglobin A1C 6.7 (A) 4.0 - 5.6 %   HbA1c POC (<> result, manual entry)     HbA1c, POC (prediabetic range)     HbA1c, POC (controlled diabetic range)     Assessment & Plan:  This visit occurred during the SARS-CoV-2 public health emergency.  Safety protocols were in place, including screening questions prior to the visit, additional usage of staff PPE, and extensive cleaning of exam room while observing appropriate contact time as indicated for disinfecting solutions.   Problem List Items Addressed This Visit     Hypertension    Chronic, deteriorated. ?related to increased pain from shoulder vs increased salt intake as he's been eating more convenience food over last few months (living out of apartment). I have recommended starting toprol XL 25mg  daily in addition to his hyzaar. Hypertension handout provided today, rec limit salt/sodium in diet.        Relevant Medications   metoprolol succinate (TOPROL-XL) 25 MG 24 hr tablet   Left shoulder pain - Primary    L shoulder pain present after recent overuse (repetitive lifting with recent move and also recently cleaning floors). Anticipate shoulder strain vs RTC injury. Supportive care reviewed - rec mid potency naprosyn, ice/heating pad, and provided with exercises from [facility] pt advisor. Update if not improving with treatment.  Not consistent with shoulder bursitis or with fracture.          Meds ordered this encounter  Medications   naproxen (NAPROSYN) 375 MG tablet    Sig: Take 1 tablet (375 mg total) by mouth 2 (two) times daily with a meal. For 1 wk then as needed     Dispense:  30 tablet    Refill:  0   omeprazole (PRILOSEC) 20 MG capsule    Sig: Take 1 capsule (20 mg total) by mouth daily.    Dispense:  90 capsule    Refill:  1   metoprolol succinate (TOPROL-XL) 25 MG 24 hr tablet    Sig: Take 1 tablet (25 mg total) by mouth daily.    Dispense:  30 tablet    Refill:  6    Note new sig   No orders of the defined types were placed in this encounter.   Patient Instructions  I think you may have  tendonitis of left arm into shoulder Treat with prescription moderate dose naprosyn 375mg   up to twice daily with meals for 7 days then as needed. Try exercises provided today.   Blood pressures are staying too elevated - start monitoring at home and let us know results. Start metoprolol XL 25mg  once daily sent to pharmacy in addition to your current medicine.  Your goal blood pressure is <140/90. Work on low salt/sodium diet - goal <1.5gm (1,500mg ) per day. Eat a diet high in fruits/vegetables and whole grains.  Look into mediterranean and DASH diet. Goal activity is 163min/wk of moderate intensity exercise.  This can be split into 30 minute chunks.  If you are not at this level, you can start with smaller 10-15 min increments and slowly build up activity. Look at [place].org for more resources   Return in 3 weeks for follow up visit on blood pressure  Follow up plan: Return in about 3 weeks (around 10/30/2020), or if symptoms worsen or fail to improve, for follow up visit.  [name], MD

## 2020-10-09 NOTE — Assessment & Plan Note (Signed)
L shoulder pain present after recent overuse (repetitive lifting with recent move and also recently cleaning floors). Anticipate shoulder strain vs RTC injury. Supportive care reviewed - rec mid potency naprosyn, ice/heating pad, and provided with exercises from [facility] pt advisor. Update if not improving with treatment.  Not consistent with shoulder bursitis or with fracture.

## 2020-10-09 NOTE — Patient Instructions (Addendum)
I think you may have tendonitis of left arm into shoulder Treat with prescription moderate dose naprosyn 375mg   up to twice daily with meals for 7 days then as needed. Try exercises provided today.   Blood pressures are staying too elevated - start monitoring at home and let us know results. Start metoprolol XL 25mg  once daily sent to pharmacy in addition to your current medicine.  Your goal blood pressure is <140/90. Work on low salt/sodium diet - goal <1.5gm (1,500mg ) per day. Eat a diet high in fruits/vegetables and whole grains.  Look into mediterranean and DASH diet. Goal activity is 154min/wk of moderate intensity exercise.  This can be split into 30 minute chunks.  If you are not at this level, you can start with smaller 10-15 min increments and slowly build up activity. Look at [place].org for more resources   Return in 3 weeks for follow up visit on blood pressure

## 2020-10-31 ENCOUNTER — Encounter: Payer: Self-pay | Admitting: Family Medicine

## 2020-10-31 ENCOUNTER — Other Ambulatory Visit: Payer: Self-pay

## 2020-10-31 ENCOUNTER — Ambulatory Visit (INDEPENDENT_AMBULATORY_CARE_PROVIDER_SITE_OTHER): Payer: Medicare Other | Admitting: Family Medicine

## 2020-10-31 VITALS — BP 126/74 | HR 73 | Temp 98.0°F | Ht 69.25 in | Wt 176.6 lb

## 2020-10-31 DIAGNOSIS — I10 Essential (primary) hypertension: Secondary | ICD-10-CM | POA: Diagnosis not present

## 2020-10-31 DIAGNOSIS — R195 Other fecal abnormalities: Secondary | ICD-10-CM | POA: Diagnosis not present

## 2020-10-31 DIAGNOSIS — R1013 Epigastric pain: Secondary | ICD-10-CM | POA: Insufficient documentation

## 2020-10-31 DIAGNOSIS — K219 Gastro-esophageal reflux disease without esophagitis: Secondary | ICD-10-CM

## 2020-10-31 MED ORDER — METFORMIN HCL 500 MG PO TABS
500.0000 mg | ORAL_TABLET | Freq: Every day | ORAL | 3 refills | Status: DC
Start: 2020-10-31 — End: 2021-03-07

## 2020-10-31 MED ORDER — LOSARTAN POTASSIUM-HCTZ 100-25 MG PO TABS
1.0000 | ORAL_TABLET | Freq: Every day | ORAL | 3 refills | Status: DC
Start: 2020-10-31 — End: 2021-04-02

## 2020-10-31 NOTE — Patient Instructions (Signed)
Blood work today  Let us know if worsening abdominal pain or worsening diarrhea.  Blood pressures are doing better - ok to stay off toprol for now

## 2020-10-31 NOTE — Assessment & Plan Note (Signed)
Of unclear etiology. Reassuring abdominal exam.  Check labwork today. Continue PPI.

## 2020-10-31 NOTE — Assessment & Plan Note (Signed)
Continue daily PPI.  

## 2020-10-31 NOTE — Progress Notes (Signed)
Patient ID: Mark Davidson, male    DOB: 1950/07/21, 70 y.o.   MRN: [id number]  This visit was conducted in person.  BP 126/74   Pulse 73   Temp 98 F (36.7 C) (Temporal)   Ht 5' 9.25" (1.759 m)   Wt 176 lb 9 oz (80.1 kg)   SpO2 97%   BMI 25.89 kg/m    CC: 3 wk HTN f/u visit  Subjective:   HPI: Mark Davidson is a 70 y.o. male presenting on 10/31/2020 for Hypertension (Here for 3 wk f/u.)   3d h/o lightheadedness associated with watery green colored diarrhea about once a day, as well as epigastric discomfort. No radiation of pain to back or shoulder blade. No fevers/chills, new foods. Uses bottled water, house is on city water. He's working on [employer].   Some R sciatic pain - today it's not bothering him.  HTN - Compliant with current antihypertensive regimen of hyzaar 100/25mg  daily. He did not try toprol XL 25mg  as home BP readings 110-120/60-70s. No low blood pressure readings or symptoms of syncope. Denies HA, vision changes, CP/tightness, SOB, leg swelling.       Relevant past medical, surgical, family and social history reviewed and updated as indicated. Interim medical history since our last visit reviewed. Allergies and medications reviewed and updated. Outpatient Medications Prior to Visit  Medication Sig Dispense Refill   atorvastatin (LIPITOR) 40 MG tablet Take 1 tablet (40 mg total) by mouth daily. 90 tablet 3   Chlorpheniramine Maleate (ALLERGY RELIEF PO) Take by mouth as needed. OTC     diphenhydramine-acetaminophen (TYLENOL PM) 25-500 MG TABS Take 1 tablet by mouth at bedtime.     omeprazole (PRILOSEC) 20 MG capsule Take 1 capsule (20 mg total) by mouth daily. 90 capsule 1   losartan-hydrochlorothiazide (HYZAAR) 100-25 MG tablet Take 1 tablet by mouth daily. 90 tablet 3   metFORMIN (GLUCOPHAGE) 500 MG tablet Take 1 tablet (500 mg total) by mouth daily with breakfast. 90 tablet 3   fluticasone (FLONASE) 50 MCG/ACT nasal spray Place 2 sprays  into both nostrils daily as needed for allergies or rhinitis. 16 g 6   metoprolol succinate (TOPROL-XL) 25 MG 24 hr tablet Take 1 tablet (25 mg total) by mouth daily. (Patient not taking: Reported on 10/31/2020) 30 tablet 6   naproxen (NAPROSYN) 375 MG tablet Take 1 tablet (375 mg total) by mouth 2 (two) times daily with a meal. For 1 wk then as needed (Patient not taking: Reported on 10/31/2020) 30 tablet 0   No facility-administered medications prior to visit.     Per HPI unless specifically indicated in ROS section below Review of Systems  Objective:  BP 126/74   Pulse 73   Temp 98 F (36.7 C) (Temporal)   Ht 5' 9.25" (1.759 m)   Wt 176 lb 9 oz (80.1 kg)   SpO2 97%   BMI 25.89 kg/m   Wt Readings from Last 3 Encounters:  10/31/20 176 lb 9 oz (80.1 kg)  10/09/20 178 lb 5 oz (80.9 kg)  05/29/20 178 lb 6 oz (80.9 kg)      Physical Exam Vitals and nursing note reviewed.  Constitutional:      Appearance: Normal appearance. He is not ill-appearing.  Cardiovascular:     Rate and Rhythm: Normal rate and regular rhythm.     Pulses: Normal pulses.     Heart sounds: Normal heart sounds. No murmur heard. Pulmonary:  Effort: Pulmonary effort is normal. No respiratory distress.     Breath sounds: Normal breath sounds. No wheezing, rhonchi or rales.  Abdominal:     General: Bowel sounds are normal. There is no distension.     Palpations: Abdomen is soft. There is no mass.     Tenderness: There is abdominal tenderness (mild) in the epigastric area. There is no right CVA tenderness, left CVA tenderness, guarding or rebound.     Hernia: No hernia is present.  Musculoskeletal:     Right lower leg: No edema.     Left lower leg: No edema.  Neurological:     Mental Status: He is alert.  Psychiatric:        Mood and Affect: Mood normal.        Behavior: Behavior normal.       Assessment & Plan:  This visit occurred during the SARS-CoV-2 public health emergency.  Safety protocols were  in place, including screening questions prior to the visit, additional usage of staff PPE, and extensive cleaning of exam room while observing appropriate contact time as indicated for disinfecting solutions.   Problem List Items Addressed This Visit     Hypertension - Primary    Did not start toprol XL. Continues Hyzaar 100/25mg  with good effect. Also has implemented healthy diet changes. Continue only Hyzaar, update [place] if BP trending up.        Relevant Medications   losartan-hydrochlorothiazide (HYZAAR) 100-25 MG tablet   GERD (gastroesophageal reflux disease)    Continue daily PPI.        Epigastric discomfort    Of unclear etiology. Reassuring abdominal exam.  Check labwork today. Continue PPI.        Relevant Orders   Comprehensive metabolic panel   CBC with Differential/Platelet   Lipase   Watery stools    Symptoms of short duration anticipate self limited cause. Check CBC, CMP today.  Update if ongoing to consider GI pathogen PCR.          Meds ordered this encounter  Medications   metFORMIN (GLUCOPHAGE) 500 MG tablet    Sig: Take 1 tablet (500 mg total) by mouth daily with breakfast.    Dispense:  90 tablet    Refill:  3   losartan-hydrochlorothiazide (HYZAAR) 100-25 MG tablet    Sig: Take 1 tablet by mouth daily.    Dispense:  90 tablet    Refill:  3    Orders Placed This Encounter  Procedures   Comprehensive metabolic panel   CBC with Differential/Platelet   Lipase    Patient Instructions  Blood work today  Let us know if worsening abdominal pain or worsening diarrhea.  Blood pressures are doing better - ok to stay off toprol for now   Follow up plan: No follow-ups on file.  [name], MD

## 2020-10-31 NOTE — Assessment & Plan Note (Signed)
Did not start toprol XL. Continues Hyzaar 100/25mg  with good effect. Also has implemented healthy diet changes. Continue only Hyzaar, update [place] if BP trending up.

## 2020-10-31 NOTE — Assessment & Plan Note (Signed)
Symptoms of short duration anticipate self limited cause. Check CBC, CMP today.  Update if ongoing to consider GI pathogen PCR.

## 2020-11-01 LAB — CBC WITH DIFFERENTIAL/PLATELET
Basophils Absolute: 0.1 10*3/uL (ref 0.0–0.1)
Basophils Relative: 1.1 % (ref 0.0–3.0)
Eosinophils Absolute: 0.5 10*3/uL (ref 0.0–0.7)
Eosinophils Relative: 5.4 % — ABNORMAL HIGH (ref 0.0–5.0)
HCT: 36.6 % — ABNORMAL LOW (ref 39.0–52.0)
Hemoglobin: 12.7 g/dL — ABNORMAL LOW (ref 13.0–17.0)
Lymphocytes Relative: 22.5 % (ref 12.0–46.0)
Lymphs Abs: 2 10*3/uL (ref 0.7–4.0)
MCHC: 34.6 g/dL (ref 30.0–36.0)
MCV: 88.3 fl (ref 78.0–100.0)
Monocytes Absolute: 1 10*3/uL (ref 0.1–1.0)
Monocytes Relative: 10.8 % (ref 3.0–12.0)
Neutro Abs: 5.4 10*3/uL (ref 1.4–7.7)
Neutrophils Relative %: 60.2 % (ref 43.0–77.0)
Platelets: 218 10*3/uL (ref 150.0–400.0)
RBC: 4.15 Mil/uL — ABNORMAL LOW (ref 4.22–5.81)
RDW: 13 % (ref 11.5–15.5)
WBC: 8.9 10*3/uL (ref 4.0–10.5)

## 2020-11-01 LAB — COMPREHENSIVE METABOLIC PANEL
ALT: 29 U/L (ref 0–53)
AST: 21 U/L (ref 0–37)
Albumin: 4.4 g/dL (ref 3.5–5.2)
Alkaline Phosphatase: 75 U/L (ref 39–117)
BUN: 27 mg/dL — ABNORMAL HIGH (ref 6–23)
CO2: 26 mEq/L (ref 19–32)
Calcium: 9.5 mg/dL (ref 8.4–10.5)
Chloride: 98 mEq/L (ref 96–112)
Creatinine, Ser: 1.5 mg/dL (ref 0.40–1.50)
GFR: 47.77 mL/min — ABNORMAL LOW (ref >60.00)
Glucose, Bld: 103 mg/dL — ABNORMAL HIGH (ref 70–99)
Potassium: 3.5 mEq/L (ref 3.5–5.1)
Sodium: 134 mEq/L — ABNORMAL LOW (ref 135–145)
Total Bilirubin: 0.7 mg/dL (ref 0.2–1.2)
Total Protein: 7.1 g/dL (ref 6.0–8.3)

## 2020-11-01 LAB — LIPASE: Lipase: 43 U/L (ref 11.0–59.0)

## 2020-11-02 ENCOUNTER — Other Ambulatory Visit: Payer: Self-pay | Admitting: Family Medicine

## 2020-11-02 DIAGNOSIS — N289 Disorder of kidney and ureter, unspecified: Secondary | ICD-10-CM

## 2020-11-14 ENCOUNTER — Telehealth: Payer: Self-pay

## 2020-11-14 ENCOUNTER — Ambulatory Visit: Payer: Medicare Other

## 2020-11-14 DIAGNOSIS — R195 Other fecal abnormalities: Secondary | ICD-10-CM

## 2020-11-14 NOTE — Telephone Encounter (Signed)
Pt said last seen 10/31/20; pt said has solid BM on and off and then will have watery diarrhea with no reason for the diarrhea noted by the pt; pt said will go few days no diarrhea and then the diarrhea will come on and off. Pt said before diarrhea has a woozy feeling where pt feels little light headed and feels wobbly. The woozy feeling only last a few mins and then goes away. Pt said very hard to describe.  Pt said 11/13/20 had normal BM and felt OK; 11/14/20 pt had normal BM this morning and about 2 hrs later felt woozy for few mins and then had watery diarrhea that had some clumps of loose stool but no blood or mucus. The diarrhea comes and goes without reason per the pt. Pt is not having any abd pain but has "uneasy feeling in stomach"; pt cannot described further. No fever. In 10/31/20 note Dr Darnell Level had noted if continued with diarrhea to let DR G know and also a note in that visit Dr Darnell Level might consider GI pathogen PCR if ongoing diarrhea. Pt said he does not feel in any distress now and does not need to go to ED or UC. Pt request cb after reviewed by Dr Darnell Level. UC & ED precautions given and pt voiced understanding. Pt said a cb on 11/15/20 would be OK. Sending note to Dr Darnell Level and will take note to Dr Synthia Innocent area now.

## 2020-11-15 NOTE — Telephone Encounter (Signed)
Spoke with pt relaying Dr. Synthia Innocent message.  Pt verbalizes understanding.  Says he has a lab appt on 11/20/20 and will pick it up then.

## 2020-11-15 NOTE — Telephone Encounter (Signed)
Plz have him pick up GI pathogen panel for ongoing diarrhea.  Ordered.  He should collect next time he has watery stool

## 2020-11-20 ENCOUNTER — Other Ambulatory Visit (INDEPENDENT_AMBULATORY_CARE_PROVIDER_SITE_OTHER): Payer: Medicare Other

## 2020-11-20 ENCOUNTER — Other Ambulatory Visit: Payer: Self-pay

## 2020-11-20 ENCOUNTER — Telehealth: Payer: Self-pay | Admitting: Radiology

## 2020-11-20 DIAGNOSIS — N289 Disorder of kidney and ureter, unspecified: Secondary | ICD-10-CM | POA: Diagnosis not present

## 2020-11-20 LAB — URINALYSIS, ROUTINE W REFLEX MICROSCOPIC
Bilirubin Urine: NEGATIVE
Hgb urine dipstick: NEGATIVE
Ketones, ur: NEGATIVE
Leukocytes,Ua: NEGATIVE
Nitrite: NEGATIVE
RBC / HPF: NONE SEEN (ref 0–?)
Specific Gravity, Urine: 1.02 (ref 1.000–1.030)
Total Protein, Urine: NEGATIVE
Urine Glucose: NEGATIVE
Urobilinogen, UA: 0.2 (ref 0.0–1.0)
pH: 5.5 (ref 5.0–8.0)

## 2020-11-20 LAB — RENAL FUNCTION PANEL
Albumin: 4.4 g/dL (ref 3.5–5.2)
BUN: 20 mg/dL (ref 6–23)
CO2: 27 mEq/L (ref 19–32)
Calcium: 9.8 mg/dL (ref 8.4–10.5)
Chloride: 102 mEq/L (ref 96–112)
Creatinine, Ser: 1.2 mg/dL (ref 0.40–1.50)
GFR: 62.41 mL/min (ref >60.00)
Glucose, Bld: 112 mg/dL — ABNORMAL HIGH (ref 70–99)
Phosphorus: 3 mg/dL (ref 2.3–4.6)
Potassium: 4.6 mEq/L (ref 3.5–5.1)
Sodium: 140 mEq/L (ref 135–145)

## 2020-11-20 LAB — VITAMIN D 25 HYDROXY (VIT D DEFICIENCY, FRACTURES): VITD: 32.78 ng/mL (ref 30.00–100.00)

## 2020-11-20 NOTE — Telephone Encounter (Signed)
LVM for the patient to call back for proper stool collection instructions. Please direct call back to the laboratory when patient calls.

## 2020-11-20 NOTE — Addendum Note (Signed)
Addended by: Ellamae Sia on: 11/20/2020 07:56 AM   Modules accepted: Orders

## 2020-11-21 LAB — PARATHYROID HORMONE, INTACT (NO CA): PTH: 45 pg/mL (ref 16–77)

## 2020-11-27 ENCOUNTER — Encounter: Payer: Medicare Other | Admitting: Family Medicine

## 2020-12-01 ENCOUNTER — Other Ambulatory Visit: Payer: Medicare Other

## 2020-12-01 DIAGNOSIS — R195 Other fecal abnormalities: Secondary | ICD-10-CM

## 2020-12-05 ENCOUNTER — Telehealth: Payer: Self-pay

## 2020-12-05 NOTE — Telephone Encounter (Signed)
Pt left v/m requesting cb to review recent lab results; pt still has stomach issues.

## 2020-12-06 LAB — GASTROINTESTINAL PATHOGEN PANEL PCR

## 2020-12-06 NOTE — Telephone Encounter (Signed)
Spoke with pt notifying him we haven't received the latest results yet.  Pt verbalizes understanding and wants to make Dr. Darnell Level aware he's still having stomach upset and a little diarrhea.

## 2020-12-07 ENCOUNTER — Encounter: Payer: Self-pay | Admitting: Family Medicine

## 2020-12-07 NOTE — Telephone Encounter (Signed)
Results released via mychart - test not done by lab. I asked him to resubmit GI pathogen panel

## 2020-12-07 NOTE — Progress Notes (Signed)
Patient picked up testing supplies today

## 2020-12-07 NOTE — Telephone Encounter (Signed)
Spoke with pt reminding him to come by to pick up another kit to collect another sample.  Pt verbalizes understanding and states will pick up as soon as he gets a chance.  In the meantime, pt is asking if there is something Dr. Darnell Level can send in.  Pt aware Dr. Darnell Level is out of office.

## 2020-12-08 ENCOUNTER — Other Ambulatory Visit: Payer: Medicare Other

## 2020-12-08 DIAGNOSIS — R197 Diarrhea, unspecified: Secondary | ICD-10-CM | POA: Diagnosis not present

## 2020-12-08 DIAGNOSIS — A09 Infectious gastroenteritis and colitis, unspecified: Secondary | ICD-10-CM | POA: Diagnosis not present

## 2020-12-08 DIAGNOSIS — R195 Other fecal abnormalities: Secondary | ICD-10-CM | POA: Diagnosis not present

## 2020-12-11 ENCOUNTER — Encounter: Payer: Self-pay | Admitting: Family Medicine

## 2020-12-15 LAB — GASTROINTESTINAL PATHOGEN PANEL PCR
C. difficile Tox A/B, PCR: NOT DETECTED
Campylobacter, PCR: NOT DETECTED
Cryptosporidium, PCR: NOT DETECTED
E coli (ETEC) LT/ST PCR: NOT DETECTED
E coli (STEC) stx1/stx2, PCR: NOT DETECTED
E coli 0157, PCR: NOT DETECTED
Giardia lamblia, PCR: NOT DETECTED
Norovirus, PCR: NOT DETECTED
Rotavirus A, PCR: NOT DETECTED
Salmonella, PCR: NOT DETECTED
Shigella, PCR: NOT DETECTED

## 2020-12-27 DIAGNOSIS — E119 Type 2 diabetes mellitus without complications: Secondary | ICD-10-CM | POA: Diagnosis not present

## 2020-12-27 LAB — HM DIABETES EYE EXAM

## 2020-12-29 ENCOUNTER — Encounter: Payer: Self-pay | Admitting: Family Medicine

## 2021-01-20 ENCOUNTER — Other Ambulatory Visit: Payer: Self-pay | Admitting: Family Medicine

## 2021-01-22 NOTE — Telephone Encounter (Signed)
Refill request Atorvastatin Last office visit 10/31/20 No upcoming appointment scheduled Appointment cancel via of mychart 02/26/21 Last lipid 11/17/19

## 2021-02-26 ENCOUNTER — Encounter: Payer: Medicare Other | Admitting: Family Medicine

## 2021-03-05 ENCOUNTER — Telehealth: Payer: Self-pay

## 2021-03-05 NOTE — Telephone Encounter (Signed)
Left vm to confirm 03/07/21 appointment-Toni

## 2021-03-07 ENCOUNTER — Encounter: Payer: Self-pay | Admitting: Nurse Practitioner

## 2021-03-07 ENCOUNTER — Other Ambulatory Visit: Payer: Self-pay

## 2021-03-07 ENCOUNTER — Ambulatory Visit (INDEPENDENT_AMBULATORY_CARE_PROVIDER_SITE_OTHER): Payer: Medicare Other | Admitting: Nurse Practitioner

## 2021-03-07 VITALS — BP 122/50 | HR 85 | Temp 97.9°F | Resp 16 | Ht 69.0 in | Wt 176.0 lb

## 2021-03-07 DIAGNOSIS — E1165 Type 2 diabetes mellitus with hyperglycemia: Secondary | ICD-10-CM

## 2021-03-07 DIAGNOSIS — Z23 Encounter for immunization: Secondary | ICD-10-CM

## 2021-03-07 DIAGNOSIS — E118 Type 2 diabetes mellitus with unspecified complications: Secondary | ICD-10-CM

## 2021-03-07 DIAGNOSIS — Z7689 Persons encountering health services in other specified circumstances: Secondary | ICD-10-CM

## 2021-03-07 DIAGNOSIS — R052 Subacute cough: Secondary | ICD-10-CM | POA: Diagnosis not present

## 2021-03-07 LAB — POCT GLYCOSYLATED HEMOGLOBIN (HGB A1C): Hemoglobin A1C: 6.9 % — AB (ref 4.0–5.6)

## 2021-03-07 MED ORDER — BENZONATATE 100 MG PO CAPS: 100.0000 mg | ORAL_CAPSULE | Freq: Two times a day (BID) | ORAL | 0 refills | Status: DC | PRN

## 2021-03-07 MED ORDER — TETANUS-DIPHTHERIA TOXOIDS TD 5-2 LFU IM INJ: 0.5000 mL | INJECTION | Freq: Once | INTRAMUSCULAR | 0 refills | Status: AC

## 2021-03-07 MED ORDER — METFORMIN HCL 500 MG PO TABS: 500.0000 mg | ORAL_TABLET | Freq: Two times a day (BID) | ORAL | 1 refills | Status: DC

## 2021-03-07 NOTE — Progress Notes (Signed)
[facility] [place], [place] [address]  Internal MEDICINE  Office Visit Note  Patient Name: Mark Davidson  [id number]  [id number]  Date of Service: 03/07/2021   Complaints/HPI Pt is here for establishment of PCP. Chief Complaint  Patient presents with   New Patient (Initial Visit)    Left shoulder pain, worse recently, noticed it during the summer July or  august, not tasting right over the last few days, did not take covid test    Diabetes   Hyperlipidemia   Hypertension   HPI Mark Davidson presents for a new patient visit to establish care. He is a well-appearing 70 year old male he is establishing care with a new PCP.  He lives at home with his wife normally and is retired.  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  He does chew tobacco but denies alcohol use or recreational drug use.  He has left shoulder pain possible bursitis denies any other pains.  He did have 3 days of diarrhea but this has been getting better and could possibly just be a viral illness.  The has had issues with runny nose cough and distortion of taste recently and watery itchy eyes.  He has been taking Allegra and cough medicine which has been helping some.    Current Medication: Outpatient Encounter Medications as of 03/07/2021  Medication Sig   atorvastatin (LIPITOR) 40 MG tablet TAKE 1 TABLET BY MOUTH ONCE A DAY   benzonatate (TESSALON) 100 MG capsule Take 1 capsule (100 mg total) by mouth 2 (two) times daily as needed for cough.   Chlorpheniramine Maleate (ALLERGY RELIEF PO) Take by mouth as needed. OTC   diphenhydramine-acetaminophen (TYLENOL PM) 25-500 MG TABS Take 1 tablet by mouth at bedtime.   omeprazole (PRILOSEC) 20 MG capsule Take 1 capsule (20 mg total) by mouth daily.   [EXPIRED] tetanus & diphtheria toxoids, adult, (TENIVAC) 5-2 LFU injection Inject 0.5 mLs into the muscle once for 1 dose.   [DISCONTINUED] losartan-hydrochlorothiazide (HYZAAR) 100-25 MG  tablet Take 1 tablet by mouth daily.   [DISCONTINUED] metFORMIN (GLUCOPHAGE) 500 MG tablet Take 1 tablet (500 mg total) by mouth daily with breakfast.   metFORMIN (GLUCOPHAGE) 500 MG tablet Take 1 tablet (500 mg total) by mouth 2 (two) times daily with a meal.   No facility-administered encounter medications on file as of 03/07/2021.    Surgical History: Past Surgical History:  Procedure Laterality Date   CARDIAC CATHETERIZATION  2009   normal per pt   carotid US  03/2012   bilateral 40-59% stenosis, rec rpt 1 yr   CHOLECYSTECTOMY N/A 07/06/2017   path showed acute and chronic cholecystitis - Pabon, Remington, MD   TONSILLECTOMY AND ADENOIDECTOMY      Medical History: Past Medical History:  Diagnosis Date   Arthritis    Carotid stenosis    Cholecystitis, acute 07/06/2017   Colon polyp    Diabetes mellitus type 2, uncontrolled, without complications    ED (erectile dysfunction)    Hyperlipemia    Hypertension    Left fibular fracture 01/17/2015    Family History: Family History  Problem Relation Age of Onset   Rheum arthritis Father    Hyperlipidemia Father    Heart disease Father    Stroke Father    Diabetes Father    Arthritis Father    Coronary artery disease Father    Breast cancer Mother    Cancer Mother  breast   Heart disease Brother    Coronary artery disease Brother    Hyperlipidemia Brother    Diabetes Brother     Social History   Socioeconomic History   Marital status: Married    Spouse name: Not on file   Number of children: 0   Years of education: Not on file   Highest education level: Not on file  Occupational History   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   Diet: poor fruits and vegetables, good water intake.  Lots of red meat   Social Determinants of Health   Financial Resource Strain: Not on file  Food Insecurity: Not on file  Transportation Needs: Not on file  Physical Activity: Not on file  Stress: Not on file  Social Connections: Not on file  Intimate Partner Violence: Not on file     Review of Systems  Constitutional:  Negative for chills, fatigue and unexpected weight change.  HENT:  Negative for congestion, rhinorrhea, sneezing and sore throat.   Eyes:  Negative for redness.  Respiratory:  Negative for cough, chest tightness and shortness of breath.   Cardiovascular:  Negative for chest pain and palpitations.  Gastrointestinal:  Negative for abdominal pain, constipation, diarrhea, nausea and vomiting.  Genitourinary:  Negative for dysuria and frequency.  Musculoskeletal:  Negative for arthralgias, back pain, joint swelling and neck pain.  Skin:  Negative for rash.  Neurological: Negative.  Negative for tremors and numbness.  Hematological:  Negative for adenopathy. Does not bruise/bleed easily.  Psychiatric/Behavioral:  Negative for behavioral problems (Depression), sleep disturbance and suicidal ideas. The patient is not nervous/anxious.    Vital Signs: BP (!) 122/50   Pulse 85   Temp 97.9 F (36.6 C)   Resp 16   Ht 5\' 9"  (1.753 m)   Wt 176 lb (79.8 kg)   SpO2 97%   BMI 25.99 kg/m    Physical Exam Vitals reviewed.  Constitutional:      Appearance: Normal appearance. He is obese.  HENT:     Head: Normocephalic and atraumatic.  Eyes:     Pupils: Pupils are equal, round, and reactive to light.  Cardiovascular:     Rate and Rhythm: Normal rate and regular rhythm.  Pulmonary:     Effort: Pulmonary effort is  normal. No respiratory distress.  Neurological:     Mental Status: He is alert and oriented to person, place, and time.     Cranial Nerves: No cranial nerve deficit.     Coordination: Coordination normal.     Gait: Gait normal.  Psychiatric:        Mood and Affect: Mood normal.        Behavior: Behavior normal.      Assessment/Plan: 1. Controlled type 2 diabetes mellitus with hyperglycemia, without long-term current use of insulin (HCC) A1C is 6.9, metformin refills ordered. - POCT HgB A1C - metFORMIN (GLUCOPHAGE) 500  MG tablet; Take 1 tablet (500 mg total) by mouth 2 (two) times daily with a meal.  Dispense: 180 tablet; Refill: 1  2. Subacute cough Symptomatic treatment - benzonatate (TESSALON) 100 MG capsule; Take 1 capsule (100 mg total) by mouth 2 (two) times daily as needed for cough.  Dispense: 30 capsule; Refill: 0  3. Need for vaccination - tetanus & diphtheria toxoids, adult, (TENIVAC) 5-2 LFU injection; Inject 0.5 mLs into the muscle once for 1 dose.  Dispense: 0.5 mL; Refill: 0  4. Encounter to establish care with new doctor Need new pcp, has diabetes, hypertension and hyperlipidemia.      General Counseling: chanson teems understanding of the findings of todays visit and agrees with plan of treatment. I have discussed any further diagnostic evaluation that may be needed or ordered today. We also reviewed his medications today. he has been encouraged to call the office with any questions or concerns that should arise related to todays visit.    Counseling:  [place] Controlled Substance Database was reviewed by me.  Orders Placed This Encounter  Procedures   POCT HgB A1C    Meds ordered this encounter  Medications   metFORMIN (GLUCOPHAGE) 500 MG tablet    Sig: Take 1 tablet (500 mg total) by mouth 2 (two) times daily with a meal.    Dispense:  180 tablet    Refill:  1   benzonatate (TESSALON) 100 MG capsule    Sig: Take 1 capsule (100 mg total) by mouth 2  (two) times daily as needed for cough.    Dispense:  30 capsule    Refill:  0   tetanus & diphtheria toxoids, adult, (TENIVAC) 5-2 LFU injection    Sig: Inject 0.5 mLs into the muscle once for 1 dose.    Dispense:  0.5 mL    Refill:  0    Return in about 3 months (around 06/07/2021) for CPE, Recheck A1C, Quida Glasser PCP.  Time spent:30 Minutes Time spent with patient included reviewing progress notes, labs, imaging studies, and discussing plan for follow up.   This patient was seen by Jonetta Osgood, FNP-C in collaboration with Dr. Clayborn Bigness as a part of collaborative care agreement.   Rakel Junio R. Valetta Fuller, MSN, FNP-C Internal Medicine

## 2021-03-28 ENCOUNTER — Ambulatory Visit: Payer: Medicare Other | Admitting: Dermatology

## 2021-03-28 ENCOUNTER — Other Ambulatory Visit: Payer: Self-pay

## 2021-03-28 DIAGNOSIS — L578 Other skin changes due to chronic exposure to nonionizing radiation: Secondary | ICD-10-CM

## 2021-03-28 DIAGNOSIS — L570 Actinic keratosis: Secondary | ICD-10-CM | POA: Diagnosis not present

## 2021-03-28 DIAGNOSIS — D229 Melanocytic nevi, unspecified: Secondary | ICD-10-CM

## 2021-03-28 DIAGNOSIS — L814 Other melanin hyperpigmentation: Secondary | ICD-10-CM

## 2021-03-28 DIAGNOSIS — Z1283 Encounter for screening for malignant neoplasm of skin: Secondary | ICD-10-CM

## 2021-03-28 DIAGNOSIS — L821 Other seborrheic keratosis: Secondary | ICD-10-CM | POA: Diagnosis not present

## 2021-03-28 DIAGNOSIS — D692 Other nonthrombocytopenic purpura: Secondary | ICD-10-CM

## 2021-03-28 DIAGNOSIS — L918 Other hypertrophic disorders of the skin: Secondary | ICD-10-CM | POA: Diagnosis not present

## 2021-03-28 DIAGNOSIS — L720 Epidermal cyst: Secondary | ICD-10-CM | POA: Diagnosis not present

## 2021-03-28 DIAGNOSIS — D1800 Hemangioma unspecified site: Secondary | ICD-10-CM

## 2021-03-28 NOTE — Patient Instructions (Signed)

## 2021-03-28 NOTE — Progress Notes (Signed)
Follow-Up Visit   Subjective  Mark Davidson is a 70 y.o. male who presents for the following: Follow-up (Total body exam today. No hx of skin cancer or dysplastic nevi. Pt has several spots to be evaluated today. ). The patient presents for Total-Body Skin Exam (TBSE) for skin cancer screening and mole check.The patient has spots, moles and lesions to be evaluated, some may be new or changing and the patient has concerns that these could be cancer.  The following portions of the chart were reviewed this encounter and updated as appropriate:  Tobacco  Allergies  Meds  Problems  Med Hx  Surg Hx  Fam Hx     Review of Systems: No other skin or systemic complaints except as noted in HPI or Assessment and Plan.  Objective  Well appearing patient in no apparent distress; mood and affect are within normal limits.  A full examination was performed including scalp, head, eyes, ears, nose, lips, neck, chest, axillae, abdomen, back, buttocks, bilateral upper extremities, bilateral lower extremities, hands, feet, fingers, toes, fingernails, and toenails. All findings within normal limits unless otherwise noted below.  mid vertex scalp x 1 Erythematous thin papules/macules with gritty scale.   right post shoulder 1.2 cm firm subcutaneous papule  Assessment & Plan  AK (actinic keratosis) mid vertex scalp x 1  Actinic keratoses are precancerous spots that appear secondary to cumulative UV radiation exposure/sun exposure over time. They are chronic with expected duration over 1 year. A portion of actinic keratoses will progress to squamous cell carcinoma of the skin. It is not possible to reliably predict which spots will progress to skin cancer and so treatment is recommended to prevent development of skin cancer.  Recommend daily broad spectrum sunscreen SPF 30+ to sun-exposed areas, reapply every 2 hours as needed.  Recommend staying in the shade or wearing long sleeves, sun glasses  (UVA+UVB protection) and wide brim hats (4-inch brim around the entire circumference of the hat). Call for new or changing lesions.  Prior to procedure, discussed risks of blister formation, small wound, skin dyspigmentation, or rare scar following cryotherapy. Recommend Vaseline ointment to treated areas while healing.  Will recheck at f/u  Destruction of lesion - mid vertex scalp x 1 Complexity: simple   Destruction method: cryotherapy   Informed consent: discussed and consent obtained   Timeout:  patient name, date of birth, surgical site, and procedure verified Lesion destroyed using liquid nitrogen: Yes   Region frozen until ice ball extended beyond lesion: Yes   Outcome: patient tolerated procedure well with no complications   Post-procedure details: wound care instructions given    Epidermal cyst right post shoulder  Benign-appearing. Exam most consistent with an epidermal inclusion cyst. Discussed that a cyst is a benign growth that can grow over time and sometimes get irritated or inflamed. Recommend observation if it is not bothersome. Discussed option of surgical excision to remove it if it is growing, symptomatic, or other changes noted. Please call for new or changing lesions so they can be evaluated.  Skin cancer screening  Lentigines - Scattered tan macules - Due to sun exposure - Benign-appearing, observe - Recommend daily broad spectrum sunscreen SPF 30+ to sun-exposed areas, reapply every 2 hours as needed. - Call for any changes  Seborrheic Keratoses - Stuck-on, waxy, tan-brown papules and/or plaques  - Benign-appearing - Discussed benign etiology and prognosis. - Observe - Call for any changes  Melanocytic Nevi - Tan-brown and/or pink-flesh-colored symmetric macules and papules -  Benign appearing on exam today - Observation - Call clinic for new or changing moles - Recommend daily use of broad spectrum spf 30+ sunscreen to sun-exposed areas.    Hemangiomas - Red papules - Discussed benign nature - Observe - Call for any changes  Purpura - Chronic; persistent and recurrent.  Treatable, but not curable. - Violaceous macules and patches - Benign - Related to trauma, age, sun damage and/or use of blood thinners, chronic use of topical and/or oral steroids - Observe - Can use OTC arnica containing moisturizer such as Dermend Bruise Formula if desired - Call for worsening or other concerns  Acrochordons (Skin Tags) Right axilla - Fleshy, skin-colored pedunculated papules - Benign appearing.  - Observe. - If desired, they can be removed with an in office procedure that is not covered by insurance. - Please call the clinic if you notice any new or changing lesions.  Actinic Damage - Chronic condition, secondary to cumulative UV/sun exposure - diffuse scaly erythematous macules with underlying dyspigmentation - Recommend daily broad spectrum sunscreen SPF 30+ to sun-exposed areas, reapply every 2 hours as needed.  - Staying in the shade or wearing long sleeves, sun glasses (UVA+UVB protection) and wide brim hats (4-inch brim around the entire circumference of the hat) are also recommended for sun protection.  - Call for new or changing lesions.  Skin cancer screening performed today.  Return in about 3 months (around 06/26/2021) for 2-3 mo AK f/u.  IHarriett Sine, CMA, am acting as scribe for Sarina Ser, MD.  Documentation: I have reviewed the above documentation for accuracy and completeness, and I agree with the above.  Sarina Ser, MD

## 2021-04-02 ENCOUNTER — Encounter: Payer: Self-pay | Admitting: Nurse Practitioner

## 2021-04-02 ENCOUNTER — Other Ambulatory Visit: Payer: Self-pay

## 2021-04-02 ENCOUNTER — Ambulatory Visit (INDEPENDENT_AMBULATORY_CARE_PROVIDER_SITE_OTHER): Payer: Medicare Other | Admitting: Nurse Practitioner

## 2021-04-02 VITALS — BP 157/68 | HR 70 | Temp 98.5°F | Resp 16 | Ht 70.0 in | Wt 179.0 lb

## 2021-04-02 DIAGNOSIS — E1165 Type 2 diabetes mellitus with hyperglycemia: Secondary | ICD-10-CM | POA: Diagnosis not present

## 2021-04-02 DIAGNOSIS — R053 Chronic cough: Secondary | ICD-10-CM

## 2021-04-02 DIAGNOSIS — I10 Essential (primary) hypertension: Secondary | ICD-10-CM | POA: Diagnosis not present

## 2021-04-02 MED ORDER — HYDROCOD POLST-CPM POLST ER 10-8 MG/5ML PO SUER: 5.0000 mL | Freq: Two times a day (BID) | ORAL | 0 refills | Status: DC | PRN

## 2021-04-02 MED ORDER — METOPROLOL SUCCINATE ER 25 MG PO TB24: 25.0000 mg | ORAL_TABLET | Freq: Every day | ORAL | 3 refills | Status: DC

## 2021-04-02 MED ORDER — HYDROCHLOROTHIAZIDE 25 MG PO TABS: 25.0000 mg | ORAL_TABLET | Freq: Every day | ORAL | 3 refills | Status: DC

## 2021-04-02 NOTE — Progress Notes (Signed)
[facility] [place], [place] [address]  Internal MEDICINE  Office Visit Note  Patient Name: Mark Davidson  [id number]  [id number]  Date of Service: 04/02/2021  Chief Complaint  Patient presents with   Acute Visit    Loose stools x68month   Cough    Affecting sleep     HPI Azarias presents for an acute sick visit for loose stools x 1 month and a chronic cough for years and has been on losartan. It is likely the cough is due to losartan.  At his previous office visit, his metformin dose was increased which could be contributing to the loose stools.     Current Medication:  Outpatient Encounter Medications as of 04/02/2021  Medication Sig   atorvastatin (LIPITOR) 40 MG tablet TAKE 1 TABLET BY MOUTH ONCE A DAY   benzonatate (TESSALON) 100 MG capsule Take 1 capsule (100 mg total) by mouth 2 (two) times daily as needed for cough.   Chlorpheniramine Maleate (ALLERGY RELIEF PO) Take by mouth as needed. OTC   chlorpheniramine-HYDROcodone (TUSSIONEX PENNKINETIC ER) 10-8 MG/5ML SUER Take 5 mLs by mouth every 12 (twelve) hours as needed for cough.   diphenhydramine-acetaminophen (TYLENOL PM) 25-500 MG TABS Take 1 tablet by mouth at bedtime.   hydrochlorothiazide (HYDRODIURIL) 25 MG tablet Take 1 tablet (25 mg total) by mouth daily.   metFORMIN (GLUCOPHAGE) 500 MG tablet Take 1 tablet (500 mg total) by mouth 2 (two) times daily with a meal.   [DISCONTINUED] losartan-hydrochlorothiazide (HYZAAR) 100-25 MG tablet Take 1 tablet by mouth daily.   [DISCONTINUED] metoprolol succinate (TOPROL-XL) 25 MG 24 hr tablet Take 1 tablet (25 mg total) by mouth daily.   [DISCONTINUED] omeprazole (PRILOSEC) 20 MG capsule Take 1 capsule (20 mg total) by mouth daily.   No facility-administered encounter medications on file as of 04/02/2021.      Medical History: Past Medical History:  Diagnosis Date   Arthritis    Carotid stenosis    Cholecystitis, acute 07/06/2017   Colon  polyp    Diabetes mellitus type 2, uncontrolled, without complications    ED (erectile dysfunction)    Hyperlipemia    Hypertension    Left fibular fracture 01/17/2015     Vital Signs: BP (!) 157/68   Pulse 70   Temp 98.5 F (36.9 C)   Resp 16   Ht 5\' 10"  (1.778 m)   Wt 179 lb (81.2 kg)   SpO2 98%   BMI 25.68 kg/m    Review of Systems  Constitutional:  Negative for chills, fatigue and unexpected weight change.  HENT:  Negative for congestion, rhinorrhea, sneezing and sore throat.   Eyes:  Negative for redness.  Respiratory:  Negative for cough, chest tightness and shortness of breath.   Cardiovascular:  Negative for chest pain and palpitations.  Gastrointestinal:  Negative for abdominal pain, constipation, diarrhea, nausea and vomiting.  Genitourinary:  Negative for dysuria and frequency.  Musculoskeletal:  Negative for arthralgias, back pain, joint swelling and neck pain.  Skin:  Negative for rash.  Neurological: Negative.  Negative for tremors and numbness.  Hematological:  Negative for adenopathy. Does not bruise/bleed easily.  Psychiatric/Behavioral:  Negative for behavioral problems (Depression), sleep disturbance and suicidal ideas. The patient is not nervous/anxious.    Physical Exam Vitals reviewed.  Constitutional:      General: He is not in acute distress.    Appearance: Normal appearance. He is normal weight. He is not ill-appearing.  HENT:  Head: Normocephalic and atraumatic.  Eyes:     Pupils: Pupils are equal, round, and reactive to light.  Cardiovascular:     Rate and Rhythm: Normal rate and regular rhythm.     Heart sounds: Normal heart sounds. No murmur heard. Pulmonary:     Effort: Pulmonary effort is normal. No respiratory distress.     Breath sounds: Normal breath sounds. No wheezing or rhonchi.  Neurological:     Mental Status: He is alert and oriented to person, place, and time.     Cranial Nerves: No cranial nerve deficit.     Gait: Gait  normal.  Psychiatric:        Mood and Affect: Mood normal.        Behavior: Behavior normal.      Assessment/Plan: 1. Essential hypertension Stop losartan, trial metoprolol and HCTZ - hydrochlorothiazide (HYDRODIURIL) 25 MG tablet; Take 1 tablet (25 mg total) by mouth daily.  Dispense: 90 tablet; Refill: 3  2. Controlled type 2 diabetes mellitus with hyperglycemia, without long-term current use of insulin (HCC) Loose stools most likely caused by metformin twice a day. This should continue to get better as he gets used to increased dose.   3. Chronic cough Cough syrup prescribed to control cough at night.  - chlorpheniramine-HYDROcodone (TUSSIONEX PENNKINETIC ER) 10-8 MG/5ML SUER; Take 5 mLs by mouth every 12 (twelve) hours as needed for cough.  Dispense: 115 mL; Refill: 0   General Counseling: Koren verbalizes understanding of the findings of todays visit and agrees with plan of treatment. I have discussed any further diagnostic evaluation that may be needed or ordered today. We also reviewed his medications today. he has been encouraged to call the office with any questions or concerns that should arise related to todays visit.    Counseling:    No orders of the defined types were placed in this encounter.   Meds ordered this encounter  Medications   hydrochlorothiazide (HYDRODIURIL) 25 MG tablet    Sig: Take 1 tablet (25 mg total) by mouth daily.    Dispense:  90 tablet    Refill:  3   DISCONTD: metoprolol succinate (TOPROL-XL) 25 MG 24 hr tablet    Sig: Take 1 tablet (25 mg total) by mouth daily.    Dispense:  90 tablet    Refill:  3   chlorpheniramine-HYDROcodone (TUSSIONEX PENNKINETIC ER) 10-8 MG/5ML SUER    Sig: Take 5 mLs by mouth every 12 (twelve) hours as needed for cough.    Dispense:  115 mL    Refill:  0    Return in about 4 weeks (around 04/30/2021) for F/U, BP check, Tiena Manansala PCP.  [place] Controlled Substance Database was reviewed by me for overdose risk score  (ORS)  Time spent:30 Minutes Time spent with patient included reviewing progress notes, labs, imaging studies, and discussing plan for follow up.   This patient was seen by Jonetta Osgood, FNP-C in collaboration with Dr. Clayborn Bigness as a part of collaborative care agreement.  Shadaya Marschner R. Valetta Fuller, MSN, FNP-C Internal Medicine

## 2021-04-04 ENCOUNTER — Encounter: Payer: Self-pay | Admitting: Nurse Practitioner

## 2021-04-05 ENCOUNTER — Encounter: Payer: Self-pay | Admitting: Dermatology

## 2021-04-16 ENCOUNTER — Telehealth: Payer: Self-pay

## 2021-04-18 ENCOUNTER — Other Ambulatory Visit: Payer: Self-pay | Admitting: Nurse Practitioner

## 2021-04-18 DIAGNOSIS — I10 Essential (primary) hypertension: Secondary | ICD-10-CM

## 2021-04-18 MED ORDER — HYDRALAZINE HCL 10 MG PO TABS: 10.0000 mg | ORAL_TABLET | Freq: Three times a day (TID) | ORAL | 1 refills | Status: DC

## 2021-04-18 NOTE — Telephone Encounter (Signed)
As per alyssa advised take hydralazine twice a day until his next appt

## 2021-04-18 NOTE — Telephone Encounter (Signed)
Lmom to call us back 

## 2021-04-30 ENCOUNTER — Other Ambulatory Visit: Payer: Self-pay | Admitting: Family Medicine

## 2021-05-01 ENCOUNTER — Encounter: Payer: Self-pay | Admitting: Nurse Practitioner

## 2021-05-01 ENCOUNTER — Ambulatory Visit (INDEPENDENT_AMBULATORY_CARE_PROVIDER_SITE_OTHER): Payer: No Typology Code available for payment source | Admitting: Nurse Practitioner

## 2021-05-01 ENCOUNTER — Other Ambulatory Visit: Payer: Self-pay

## 2021-05-01 VITALS — BP 160/70 | HR 68 | Temp 98.8°F | Resp 16 | Ht 70.0 in | Wt 180.0 lb

## 2021-05-01 DIAGNOSIS — I10 Essential (primary) hypertension: Secondary | ICD-10-CM

## 2021-05-01 DIAGNOSIS — R197 Diarrhea, unspecified: Secondary | ICD-10-CM

## 2021-05-01 DIAGNOSIS — Z125 Encounter for screening for malignant neoplasm of prostate: Secondary | ICD-10-CM | POA: Diagnosis not present

## 2021-05-01 DIAGNOSIS — R110 Nausea: Secondary | ICD-10-CM

## 2021-05-01 DIAGNOSIS — E1169 Type 2 diabetes mellitus with other specified complication: Secondary | ICD-10-CM

## 2021-05-01 DIAGNOSIS — E559 Vitamin D deficiency, unspecified: Secondary | ICD-10-CM | POA: Diagnosis not present

## 2021-05-01 DIAGNOSIS — E785 Hyperlipidemia, unspecified: Secondary | ICD-10-CM

## 2021-05-01 DIAGNOSIS — E1165 Type 2 diabetes mellitus with hyperglycemia: Secondary | ICD-10-CM | POA: Diagnosis not present

## 2021-05-01 DIAGNOSIS — K219 Gastro-esophageal reflux disease without esophagitis: Secondary | ICD-10-CM

## 2021-05-01 MED ORDER — LOSARTAN POTASSIUM 25 MG PO TABS: 25.0000 mg | ORAL_TABLET | Freq: Every day | ORAL | 0 refills | Status: DC

## 2021-05-01 MED ORDER — OMEPRAZOLE 20 MG PO CPDR: 20.0000 mg | DELAYED_RELEASE_CAPSULE | Freq: Every day | ORAL | 3 refills | Status: DC

## 2021-05-01 NOTE — Progress Notes (Signed)
[facility] [place], [place] [address]  Internal MEDICINE  Office Visit Note  Patient Name: Mark Davidson  [id number]  [id number]  Date of Service: 05/01/2021  Chief Complaint  Patient presents with   Follow-up   Diabetes   Hyperlipidemia   Hypertension    HPI Mark Davidson presents for a follow-up visit for hypertension and diabetes.  He continues to experience diarrhea and loose stools.  His metformin dose was increased to twice a day at a previous office visit and he has been having problems since then.  At his previous office visit, he was taken off losartan and started on metoprolol due to having a chronic cough.  Patient did not tolerate metoprolol due to abdominal pain so the medication was stopped prior to today's office visit and he was switched to hydralazine.  His blood pressure is not well controlled with hydralazine and he is wanting to discuss restarting losartan.     Current Medication: Outpatient Encounter Medications as of 05/01/2021  Medication Sig   atorvastatin (LIPITOR) 40 MG tablet TAKE 1 TABLET BY MOUTH ONCE A DAY   Chlorpheniramine Maleate (ALLERGY RELIEF PO) Take by mouth as needed. OTC   diphenhydramine-acetaminophen (TYLENOL PM) 25-500 MG TABS Take 1 tablet by mouth at bedtime.   losartan (COZAAR) 25 MG tablet Take 1 tablet (25 mg total) by mouth daily.   metFORMIN (GLUCOPHAGE) 500 MG tablet Take 1 tablet (500 mg total) by mouth 2 (two) times daily with a meal.   [DISCONTINUED] benzonatate (TESSALON) 100 MG capsule Take 1 capsule (100 mg total) by mouth 2 (two) times daily as needed for cough. (Patient not taking: Reported on 05/17/2021)   [DISCONTINUED] chlorpheniramine-HYDROcodone (TUSSIONEX PENNKINETIC ER) 10-8 MG/5ML SUER Take 5 mLs by mouth every 12 (twelve) hours as needed for cough. (Patient not taking: Reported on 05/17/2021)   [DISCONTINUED] hydrALAZINE (APRESOLINE) 10 MG tablet Take 1 tablet (10 mg total) by mouth 3 (three) times  daily. Start by taking twice daily, if blood pressure is elevated in the middle of the day add the midday dose. (Patient not taking: Reported on 05/17/2021)   [DISCONTINUED] hydrochlorothiazide (HYDRODIURIL) 25 MG tablet Take 1 tablet (25 mg total) by mouth daily. (Patient not taking: Reported on 05/17/2021)   [DISCONTINUED] omeprazole (PRILOSEC) 20 MG capsule TAKE 1 CAPSULE BY MOUTH ONCE DAILY   [DISCONTINUED] omeprazole (PRILOSEC) 20 MG capsule Take 1 capsule (20 mg total) by mouth daily. (Patient not taking: Reported on 05/17/2021)   No facility-administered encounter medications on file as of 05/01/2021.    Surgical History: Past Surgical History:  Procedure Laterality Date   CARDIAC CATHETERIZATION  2009   normal per pt   carotid US  03/2012   bilateral 40-59% stenosis, rec rpt 1 yr   CHOLECYSTECTOMY N/A 07/06/2017   path showed acute and chronic cholecystitis - Pabon, Silex, MD   TONSILLECTOMY AND ADENOIDECTOMY      Medical History: Past Medical History:  Diagnosis Date   Arthritis    Carotid stenosis    Cholecystitis, acute 07/06/2017   Colon polyp    Diabetes mellitus type 2, uncontrolled, without complications    ED (erectile dysfunction)    Hyperlipemia    Hypertension    Left fibular fracture 01/17/2015    Family History: Family History  Problem Relation Age of Onset   Rheum arthritis Father    Hyperlipidemia Father    Heart disease Father    Stroke Father    Diabetes Father  Arthritis Father    Coronary artery disease Father    Breast cancer Mother    Cancer Mother        breast   Heart disease Brother    Coronary artery disease Brother    Hyperlipidemia Brother    Diabetes Brother     Social History   Socioeconomic History   Marital status: Married    Spouse name: Not on file   Number of children: 0   Years of education: Not on file   Highest education level: Not on file  Occupational History   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   Diet: poor fruits and vegetables, good water intake.  Lots of red meat   Social Determinants of Health   Financial Resource Strain: Not on file  Food Insecurity: Not on file  Transportation Needs: Not on file  Physical Activity: Not on file  Stress: Not on file  Social Connections: Not on file  Intimate Partner Violence: Not on file      Review of Systems  Constitutional:  Negative for chills, fatigue and unexpected weight change.  HENT:  Negative for congestion, rhinorrhea, sneezing and sore throat.   Eyes:  Negative for redness.  Respiratory:  Negative for cough, chest tightness and shortness of breath.   Cardiovascular:  Negative for chest pain and palpitations.  Gastrointestinal:  Positive for diarrhea. Negative for abdominal pain, constipation, nausea and vomiting.  Genitourinary:  Negative for dysuria and frequency.  Musculoskeletal:  Negative for arthralgias, back pain, joint swelling and neck pain.  Skin:  Negative for rash.  Neurological: Negative.  Negative for tremors and numbness.  Hematological:  Negative for adenopathy. Does not bruise/bleed easily.  Psychiatric/Behavioral:  Negative for behavioral problems (Depression), sleep disturbance and suicidal ideas. The patient is not nervous/anxious.    Vital Signs: BP (!) 160/70    Pulse 68    Temp 98.8 F (37.1 C)    Resp 16    Ht 5' 10"  (1.778 m)    Wt 180 lb (81.6 kg)    SpO2 98%    BMI 25.83 kg/m    Physical Exam Vitals reviewed.   Constitutional:      General: He is not in acute distress.    Appearance: Normal appearance. He is normal weight. He is not ill-appearing.  HENT:     Head: Normocephalic and atraumatic.  Eyes:     Pupils: Pupils are equal, round, and reactive to light.  Cardiovascular:     Rate and Rhythm: Normal rate and regular rhythm.  Pulmonary:     Effort: Pulmonary effort is normal. No respiratory distress.  Neurological:     Mental Status: He is alert and oriented to person, place, and time.     Cranial Nerves: No cranial nerve deficit.     Coordination: Coordination normal.     Gait: Gait normal.  Psychiatric:  Mood and Affect: Mood normal.        Behavior: Behavior normal.       Assessment/Plan: 1. Essential hypertension Hydralazine stopped, restart losartan. Medication sent to pharmacy. Routine labs ordered for upcoming annual wellness visit.  - losartan (COZAAR) 25 MG tablet; Take 1 tablet (25 mg total) by mouth daily.  Dispense: 30 tablet; Refill: 0 - CBC with Differential/Platelet - TSH + free T4  2. Controlled type 2 diabetes mellitus with hyperglycemia, without long-term current use of insulin ([place]) Routine labs ordered.  - CBC with Differential/Platelet - CMP14+EGFR - TSH + free T4  3. Hyperlipidemia associated with type 2 diabetes mellitus ([place]) Routine labs ordered.  - Lipid Profile - CBC with Differential/Platelet - CMP14+EGFR - TSH + free T4  4. Gastroesophageal reflux disease without esophagitis Continue omeprazole. Routine labs ordered.  - omeprazole (PRILOSEC) 20 MG capsule; Take 1 capsule (20 mg total) by mouth daily.  Dispense: 90 capsule; Refill: 3 - CBC with Differential/Platelet - TSH + free T4  5. Vitamin D deficiency Routine lab ordered.  - Vitamin D (25 hydroxy)  6. Encounter for screening for malignant neoplasm of prostate Routine lab ordered.  - PSA Total (Reflex To Free)   General Counseling: Mark Davidson understanding of the  findings of todays visit and agrees with plan of treatment. I have discussed any further diagnostic evaluation that may be needed or ordered today. We also reviewed his medications today. he has been encouraged to call the office with any questions or concerns that should arise related to todays visit.    Orders Placed This Encounter  Procedures   Lipid Profile   CBC with Differential/Platelet   CMP14+EGFR   Vitamin D (25 hydroxy)   TSH + free T4   PSA Total (Reflex To Free)   Ambulatory referral to Gastroenterology    Meds ordered this encounter  Medications   DISCONTD: omeprazole (PRILOSEC) 20 MG capsule    Sig: Take 1 capsule (20 mg total) by mouth daily.    Dispense:  90 capsule    Refill:  3    Please replace all previous orders for omeprazole with this order. Discontinue all omeprazole orders from other providers please   losartan (COZAAR) 25 MG tablet    Sig: Take 1 tablet (25 mg total) by mouth daily.    Dispense:  30 tablet    Refill:  0    Return for previously scheduled, CPE.   Total time spent:30 Minutes Time spent includes review of chart, medications, test results, and follow up plan with the patient.   [place] Controlled Substance Database was reviewed by me.  This patient was seen by Jonetta Osgood, FNP-C in collaboration with Dr. Clayborn Bigness as a part of collaborative care agreement.   Mark Pardon R. Valetta Fuller, MSN, FNP-C Internal medicine

## 2021-05-04 DIAGNOSIS — E1169 Type 2 diabetes mellitus with other specified complication: Secondary | ICD-10-CM | POA: Diagnosis not present

## 2021-05-04 DIAGNOSIS — K219 Gastro-esophageal reflux disease without esophagitis: Secondary | ICD-10-CM | POA: Diagnosis not present

## 2021-05-04 DIAGNOSIS — Z125 Encounter for screening for malignant neoplasm of prostate: Secondary | ICD-10-CM | POA: Diagnosis not present

## 2021-05-04 DIAGNOSIS — E785 Hyperlipidemia, unspecified: Secondary | ICD-10-CM | POA: Diagnosis not present

## 2021-05-04 DIAGNOSIS — E559 Vitamin D deficiency, unspecified: Secondary | ICD-10-CM | POA: Diagnosis not present

## 2021-05-04 DIAGNOSIS — I10 Essential (primary) hypertension: Secondary | ICD-10-CM | POA: Diagnosis not present

## 2021-05-04 DIAGNOSIS — E1165 Type 2 diabetes mellitus with hyperglycemia: Secondary | ICD-10-CM | POA: Diagnosis not present

## 2021-05-05 LAB — CBC WITH DIFFERENTIAL/PLATELET
Basophils Absolute: 0.1 10*3/uL (ref 0.0–0.2)
Basos: 1 %
EOS (ABSOLUTE): 0.7 10*3/uL — ABNORMAL HIGH (ref 0.0–0.4)
Eos: 9 %
Hematocrit: 41 % (ref 37.5–51.0)
Hemoglobin: 14.5 g/dL (ref 13.0–17.7)
Immature Grans (Abs): 0 10*3/uL (ref 0.0–0.1)
Immature Granulocytes: 0 %
Lymphocytes Absolute: 2.3 10*3/uL (ref 0.7–3.1)
Lymphs: 29 %
MCH: 31 pg (ref 26.6–33.0)
MCHC: 35.4 g/dL (ref 31.5–35.7)
MCV: 88 fL (ref 79–97)
Monocytes Absolute: 0.9 10*3/uL (ref 0.1–0.9)
Monocytes: 11 %
Neutrophils Absolute: 4 10*3/uL (ref 1.4–7.0)
Neutrophils: 50 %
Platelets: 211 10*3/uL (ref 150–450)
RBC: 4.68 x10E6/uL (ref 4.14–5.80)
RDW: 11.9 % (ref 11.6–15.4)
WBC: 8 10*3/uL (ref 3.4–10.8)

## 2021-05-05 LAB — CMP14+EGFR
ALT: 45 IU/L — ABNORMAL HIGH (ref 0–44)
AST: 26 IU/L (ref 0–40)
Albumin/Globulin Ratio: 1.7 (ref 1.2–2.2)
Albumin: 4.6 g/dL (ref 3.8–4.8)
Alkaline Phosphatase: 105 IU/L (ref 44–121)
BUN/Creatinine Ratio: 14 (ref 10–24)
BUN: 16 mg/dL (ref 8–27)
Bilirubin Total: 0.7 mg/dL (ref 0.0–1.2)
CO2: 26 mmol/L (ref 20–29)
Calcium: 10 mg/dL (ref 8.6–10.2)
Chloride: 96 mmol/L (ref 96–106)
Creatinine, Ser: 1.17 mg/dL (ref 0.76–1.27)
Globulin, Total: 2.7 g/dL (ref 1.5–4.5)
Glucose: 129 mg/dL — ABNORMAL HIGH (ref 70–99)
Potassium: 4.1 mmol/L (ref 3.5–5.2)
Sodium: 136 mmol/L (ref 134–144)
Total Protein: 7.3 g/dL (ref 6.0–8.5)
eGFR: 68 mL/min/{1.73_m2} (ref >59)

## 2021-05-05 LAB — LIPID PANEL
Chol/HDL Ratio: 3.4 ratio (ref 0.0–5.0)
Cholesterol, Total: 142 mg/dL (ref 100–199)
HDL: 42 mg/dL (ref >39)
LDL Chol Calc (NIH): 82 mg/dL (ref 0–99)
Triglycerides: 99 mg/dL (ref 0–149)
VLDL Cholesterol Cal: 18 mg/dL (ref 5–40)

## 2021-05-05 LAB — TSH+FREE T4
Free T4: 1.3 ng/dL (ref 0.82–1.77)
TSH: 1.64 u[IU]/mL (ref 0.450–4.500)

## 2021-05-05 LAB — PSA TOTAL (REFLEX TO FREE): Prostate Specific Ag, Serum: 1.1 ng/mL (ref 0.0–4.0)

## 2021-05-05 LAB — VITAMIN D 25 HYDROXY (VIT D DEFICIENCY, FRACTURES): Vit D, 25-Hydroxy: 26.4 ng/mL — ABNORMAL LOW (ref 30.0–100.0)

## 2021-05-07 ENCOUNTER — Telehealth: Payer: Self-pay

## 2021-05-09 ENCOUNTER — Encounter: Payer: Self-pay | Admitting: Nurse Practitioner

## 2021-05-10 ENCOUNTER — Other Ambulatory Visit: Payer: Self-pay

## 2021-05-10 ENCOUNTER — Telehealth: Payer: Self-pay

## 2021-05-10 MED ORDER — ONDANSETRON HCL 4 MG PO TABS: 4.0000 mg | ORAL_TABLET | Freq: Three times a day (TID) | ORAL | 1 refills | Status: DC | PRN

## 2021-05-10 NOTE — Telephone Encounter (Signed)
-----   Message from Jonetta Osgood, NP sent at 05/10/2021  6:59 AM EST ----- Please call patient and let him know his results:  -his cholesterol levels are normal -his CBC is normal -his metabolic panel is normal except for elevated glucose and slightly elevated ALT which is a liver enzyme. These numbers fluctuate sometimes. It is not concerning unless they stay consistently elevated over a long period of time.  -vitamin D level is slightly low so an OTC supplement of 5000 units daily is recommended.  -his thyroid levels are normal -his PSA level is normal.  None of his lab results explain why he is having GI issues. I can send in nausea medication and see if that helps. I am also considering sending a referral for gastroenterology. Please ask the patient if he would like to try medication or see a specialist.

## 2021-05-10 NOTE — Progress Notes (Signed)
Please call patient and let him know his results:  -his cholesterol levels are normal -his CBC is normal -his metabolic panel is normal except for elevated glucose and slightly elevated ALT which is a liver enzyme. These numbers fluctuate sometimes. It is not concerning unless they stay consistently elevated over a long period of time.  -vitamin D level is slightly low so an OTC supplement of 5000 units daily is recommended.  -his thyroid levels are normal -his PSA level is normal.  None of his lab results explain why he is having GI issues. I can send in nausea medication and see if that helps. I am also considering sending a referral for gastroenterology. Please ask the patient if he would like to try medication or see a specialist.

## 2021-05-10 NOTE — Telephone Encounter (Signed)
Pt notified labs by desha

## 2021-05-10 NOTE — Telephone Encounter (Signed)
Spoke to pt and informed him of lab results and instructions on OTC vit D 5000 units daily, also pt would like to try the nausea medication so we are sending ondansetron 4 mg every 8 hrs prn #20, and also he would like to see the GI specialist.

## 2021-05-10 NOTE — Telephone Encounter (Signed)
Pt was advised by Mark Davidson today for his labs

## 2021-05-10 NOTE — Telephone Encounter (Signed)
Done

## 2021-05-11 ENCOUNTER — Telehealth: Payer: Self-pay

## 2021-05-11 NOTE — Telephone Encounter (Signed)
-----   Message from Jonetta Osgood, NP sent at 05/10/2021  6:59 AM EST ----- Please call patient and let him know his results:  -his cholesterol levels are normal -his CBC is normal -his metabolic panel is normal except for elevated glucose and slightly elevated ALT which is a liver enzyme. These numbers fluctuate sometimes. It is not concerning unless they stay consistently elevated over a long period of time.  -vitamin D level is slightly low so an OTC supplement of 5000 units daily is recommended.  -his thyroid levels are normal -his PSA level is normal.  None of his lab results explain why he is having GI issues. I can send in nausea medication and see if that helps. I am also considering sending a referral for gastroenterology. Please ask the patient if he would like to try medication or see a specialist.

## 2021-05-15 ENCOUNTER — Telehealth: Payer: Self-pay

## 2021-05-15 NOTE — Telephone Encounter (Signed)
-----   Message from Jonetta Osgood, NP sent at 05/15/2021  9:04 AM EST ----- GI referral ordered

## 2021-05-15 NOTE — Telephone Encounter (Signed)
Pt advised that we placed ordered for GI referral toni will called you when its done

## 2021-05-15 NOTE — Telephone Encounter (Signed)
LMOM GI referral was ordered and this is being worked on

## 2021-05-17 ENCOUNTER — Encounter: Payer: Self-pay | Admitting: Gastroenterology

## 2021-05-17 ENCOUNTER — Ambulatory Visit (INDEPENDENT_AMBULATORY_CARE_PROVIDER_SITE_OTHER): Payer: No Typology Code available for payment source | Admitting: Gastroenterology

## 2021-05-17 ENCOUNTER — Other Ambulatory Visit: Payer: Self-pay

## 2021-05-17 VITALS — BP 165/76 | HR 74 | Temp 97.7°F | Ht 70.0 in | Wt 176.0 lb

## 2021-05-17 DIAGNOSIS — R1032 Left lower quadrant pain: Secondary | ICD-10-CM | POA: Diagnosis not present

## 2021-05-17 DIAGNOSIS — K219 Gastro-esophageal reflux disease without esophagitis: Secondary | ICD-10-CM | POA: Diagnosis not present

## 2021-05-17 DIAGNOSIS — R1031 Right lower quadrant pain: Secondary | ICD-10-CM | POA: Diagnosis not present

## 2021-05-17 DIAGNOSIS — K529 Noninfective gastroenteritis and colitis, unspecified: Secondary | ICD-10-CM | POA: Diagnosis not present

## 2021-05-17 DIAGNOSIS — IMO0002 Reserved for concepts with insufficient information to code with codable children: Secondary | ICD-10-CM | POA: Insufficient documentation

## 2021-05-17 MED ORDER — NA SULFATE-K SULFATE-MG SULF 17.5-3.13-1.6 GM/177ML PO SOLN
354.0000 mL | Freq: Once | ORAL | 0 refills | Status: AC
Start: 2021-05-17 — End: 2021-05-17

## 2021-05-17 NOTE — Progress Notes (Signed)
Mark Darby, MD [address]  [place]  [place], [place] [address]  Main: [phone]  Fax: [phone]    Gastroenterology Consultation  Referring Provider:     Jonetta Osgood, NP Primary Care Physician:  Mark Osgood, NP Primary Gastroenterologist:  Dr. Cephas Davidson Reason for Consultation:     Loose stools, lower abdominal discomfort, chronic GERD        HPI:   Mark Davidson is a 71 y.o. male referred by Mark Osgood, NP  for consultation & management of loose stools, lower abdominal discomfort and chronic GERD.  Patient has history of diabetes has been on metformin, history of chronic GERD, has been on low-dose omeprazole 20 mg daily for several years is referred to GI for evaluation of loose stools and lower abdominal discomfort.  Patient reports that he started noticing loose, mushy, nonbloody bowel movements few months ago, in August he underwent stool studies to rule out infection which was negative including C. difficile.  Patient had noticed worsening of increased bowel frequency about 2 to 3 weeks ago after the metformin dose has been increased because of elevated hemoglobin A1c.  Therefore metformin has been discontinued and referred to GI for further evaluation.  Patient denies any weight loss, abdominal bloating.  He feels very uncomfortable throughout the lower abdomen, denies any aggravating or relieving factors.  Denies any loss of appetite, early satiety, nausea or vomiting.  No evidence of anemia.  Most recent labs revealed mildly elevated ALT.  He is status postcholecystectomy in 2019.  Patient consumes tobacco sublingual for several years.  He used to smoke in the past  NSAIDs: None  Antiplts/Anticoagulants/Anti thrombotics: None  GI Procedures: Colonoscopy more than 10 years ago  Past Medical History:  Diagnosis Date   Arthritis    Carotid stenosis    Cholecystitis, acute 07/06/2017   Colon polyp    Diabetes mellitus type 2,  uncontrolled, without complications    ED (erectile dysfunction)    Hyperlipemia    Hypertension    Left fibular fracture 01/17/2015    Past Surgical History:  Procedure Laterality Date   CARDIAC CATHETERIZATION  2009   normal per pt   carotid US  03/2012   bilateral 40-59% stenosis, rec rpt 1 yr   CHOLECYSTECTOMY N/A 07/06/2017   path showed acute and chronic cholecystitis - Pabon, Diego F, MD   TONSILLECTOMY AND ADENOIDECTOMY      Current Outpatient Medications:    atorvastatin (LIPITOR) 40 MG tablet, TAKE 1 TABLET BY MOUTH ONCE A DAY, Disp: 90 tablet, Rfl: 1   Chlorpheniramine Maleate (ALLERGY RELIEF PO), Take by mouth as needed. OTC, Disp: , Rfl:    diphenhydramine-acetaminophen (TYLENOL PM) 25-500 MG TABS, Take 1 tablet by mouth at bedtime., Disp: , Rfl:    losartan (COZAAR) 25 MG tablet, Take 1 tablet (25 mg total) by mouth daily., Disp: 30 tablet, Rfl: 0   metFORMIN (GLUCOPHAGE) 500 MG tablet, Take 1 tablet (500 mg total) by mouth 2 (two) times daily with a meal., Disp: 180 tablet, Rfl: 1   Na Sulfate-K Sulfate-Mg Sulf 17.5-3.13-1.6 GM/177ML SOLN, Take 354 mLs by mouth once for 1 dose., Disp: 354 mL, Rfl: 0  Family History  Problem Relation Age of Onset   Rheum arthritis Father    Hyperlipidemia Father    Heart disease Father    Stroke Father    Diabetes Father    Arthritis Father    Coronary artery disease Father  Breast cancer Mother    Cancer Mother        breast   Heart disease Brother    Coronary artery disease Brother    Hyperlipidemia Brother    Diabetes Brother      Social History   Tobacco Use   Smoking status: Never   Smokeless tobacco: Current    Types: Snuff  Substance Use Topics   Alcohol use: No   Drug use: No    Allergies as of 05/17/2021 - Review Complete 05/17/2021  Allergen Reaction Noted   Ace inhibitors Cough 08/27/2011   Carvedilol Other (See Comments) 08/27/2019   Amlodipine Other (See Comments) 03/04/2019    Review of  Systems:    All systems reviewed and negative except where noted in HPI.   Physical Exam:  BP (!) 165/76 (BP Location: Left Arm, Patient Position: Sitting, Cuff Size: Normal)    Pulse 74    Temp 97.7 F (36.5 C) (Oral)    Ht 5\' 10"  (1.778 m)    Wt 176 lb (79.8 kg)    BMI 25.25 kg/m  No LMP for male patient.  General:   Alert,  Well-developed, well-nourished, pleasant and cooperative in NAD Head:  Normocephalic and atraumatic. Eyes:  Sclera clear, no icterus.   Conjunctiva pink. Ears:  Normal auditory acuity. Nose:  No deformity, discharge, or lesions. Mouth:  No deformity or lesions,oropharynx pink & moist. Neck:  Supple; no masses or thyromegaly. Lungs:  Respirations even and unlabored.  Clear throughout to auscultation.   No wheezes, crackles, or rhonchi. No acute distress. Heart:  Regular rate and rhythm; no murmurs, clicks, rubs, or gallops. Abdomen:  Normal bowel sounds. Soft, non-tender and non-distended without masses, hepatosplenomegaly or hernias noted.  No guarding or rebound tenderness.   Rectal: Not performed Msk:  Symmetrical without gross deformities. Good, equal movement & strength bilaterally. Pulses:  Normal pulses noted. Extremities:  No clubbing or edema.  No cyanosis. Neurologic:  Alert and oriented x3;  grossly normal neurologically. Skin:  Intact without significant lesions or rashes. No jaundice. Psych:  Alert and cooperative. Normal mood and affect.  Imaging Studies: Reviewed  Assessment and Plan:   Mark Davidson is a 71 y.o. male with history of diabetes, status postcholecystectomy secondary to acalculus cholecystitis in 2019, hypertension, hyperlipidemia is seen in consultation for 4 months history of increased bowel frequency, lower abdominal discomfort and chronic GERD  Chronic GERD Recommend EGD for further evaluation Continue omeprazole 20 mg daily  Increased bowel frequency and lower abdominal discomfort Stool studies in 11/2020 negative for  infection Repeat stool studies to rule out infection Check pancreatic fecal elastase levels given long-term nicotine use Recommend colonoscopy with TI evaluation and random colon biopsies   Follow up in 3 to 4 months based on the above work-up   Mark Darby, MD

## 2021-05-21 DIAGNOSIS — K529 Noninfective gastroenteritis and colitis, unspecified: Secondary | ICD-10-CM | POA: Diagnosis not present

## 2021-05-24 LAB — GI PROFILE, STOOL, PCR

## 2021-05-24 LAB — PANCREATIC ELASTASE, FECAL: Pancreatic Elastase, Fecal: 218 ug Elast./g (ref >200)

## 2021-05-29 ENCOUNTER — Encounter: Payer: Self-pay | Admitting: Nurse Practitioner

## 2021-06-02 ENCOUNTER — Encounter: Payer: Self-pay | Admitting: Nurse Practitioner

## 2021-06-06 ENCOUNTER — Encounter: Payer: Self-pay | Admitting: Nurse Practitioner

## 2021-06-06 ENCOUNTER — Other Ambulatory Visit: Payer: Self-pay

## 2021-06-06 ENCOUNTER — Ambulatory Visit (INDEPENDENT_AMBULATORY_CARE_PROVIDER_SITE_OTHER): Payer: No Typology Code available for payment source | Admitting: Nurse Practitioner

## 2021-06-06 VITALS — BP 140/69 | HR 82 | Temp 98.6°F | Resp 16 | Ht 69.0 in | Wt 179.2 lb

## 2021-06-06 DIAGNOSIS — K219 Gastro-esophageal reflux disease without esophagitis: Secondary | ICD-10-CM | POA: Diagnosis not present

## 2021-06-06 DIAGNOSIS — R300 Dysuria: Secondary | ICD-10-CM | POA: Diagnosis not present

## 2021-06-06 DIAGNOSIS — H6123 Impacted cerumen, bilateral: Secondary | ICD-10-CM | POA: Diagnosis not present

## 2021-06-06 DIAGNOSIS — E1165 Type 2 diabetes mellitus with hyperglycemia: Secondary | ICD-10-CM | POA: Diagnosis not present

## 2021-06-06 DIAGNOSIS — Z0001 Encounter for general adult medical examination with abnormal findings: Secondary | ICD-10-CM

## 2021-06-06 DIAGNOSIS — I10 Essential (primary) hypertension: Secondary | ICD-10-CM | POA: Diagnosis not present

## 2021-06-06 DIAGNOSIS — E785 Hyperlipidemia, unspecified: Secondary | ICD-10-CM | POA: Diagnosis not present

## 2021-06-06 DIAGNOSIS — M25512 Pain in left shoulder: Secondary | ICD-10-CM

## 2021-06-06 DIAGNOSIS — G8929 Other chronic pain: Secondary | ICD-10-CM

## 2021-06-06 DIAGNOSIS — E1169 Type 2 diabetes mellitus with other specified complication: Secondary | ICD-10-CM

## 2021-06-06 LAB — POCT GLYCOSYLATED HEMOGLOBIN (HGB A1C): Hemoglobin A1C: 7.2 % — AB (ref 4.0–5.6)

## 2021-06-06 MED ORDER — ATORVASTATIN CALCIUM 40 MG PO TABS: 40.0000 mg | ORAL_TABLET | Freq: Every day | ORAL | 1 refills | Status: DC

## 2021-06-06 MED ORDER — LOSARTAN POTASSIUM 25 MG PO TABS: 25.0000 mg | ORAL_TABLET | Freq: Every day | ORAL | 1 refills | Status: DC

## 2021-06-06 MED ORDER — LANSOPRAZOLE 30 MG PO CPDR: 30.0000 mg | DELAYED_RELEASE_CAPSULE | Freq: Every day | ORAL | 1 refills | Status: DC

## 2021-06-06 NOTE — Progress Notes (Signed)
Bilateral ear lavage completed on patient.  Removed all impacted cerumen from both ears.  Pt informed to be careful with walking due to the ear lavage can cause dizziness

## 2021-06-06 NOTE — Progress Notes (Cosign Needed)
[facility] [place], [place] [address]  Internal MEDICINE  Office Visit Note  Patient Name: Mark Davidson  [id number]  [id number]  Date of Service: 06/06/2021  Chief Complaint  Patient presents with   Diabetes   Hypertension   Cough   Medicare Wellness    HPI Mark Davidson presents for an annual well visit and physical exam.  Mark Davidson is a well-appearing 71 year old male with hypertension and diabetes.  His A1c is 7.2 today.  Patient reports that Mark Davidson does not check his glucose level at all and does not not know how to nor does Mark Davidson have a glucose meter.  Previously his metformin dose was decreased to once daily due to increased frequency of GI side effects such as diarrhea and abdominal pain.  Mark Davidson has continued to have issues with abdominal pain and is currently on omeprazole which can have a side effect of abdominal pain. Blood pressure medications were changed at a previous visit but patient was put back on losartan and his blood pressure has been doing better. Mark Davidson had his diabetic eye exam last year in August.  Mark Davidson is due for diabetic foot exam.  Mark Davidson is being seen by Dr. Marius Ditch for his GI problems and will most likely have endoscopy and colonoscopy done.  Current Medication: Outpatient Encounter Medications as of 06/06/2021  Medication Sig   Chlorpheniramine Maleate (ALLERGY RELIEF PO) Take by mouth as needed. OTC   diphenhydramine-acetaminophen (TYLENOL PM) 25-500 MG TABS Take 1 tablet by mouth at bedtime.   lansoprazole (PREVACID) 30 MG capsule Take 1 capsule (30 mg total) by mouth daily at 12 noon.   metFORMIN (GLUCOPHAGE) 500 MG tablet Take 1 tablet (500 mg total) by mouth 2 (two) times daily with a meal. (Patient taking differently: Take 500 mg by mouth daily with breakfast.)   [DISCONTINUED] atorvastatin (LIPITOR) 40 MG tablet TAKE 1 TABLET BY MOUTH ONCE A DAY   [DISCONTINUED] losartan (COZAAR) 25 MG tablet Take 1 tablet (25 mg total) by mouth daily.   atorvastatin  (LIPITOR) 40 MG tablet Take 1 tablet (40 mg total) by mouth daily.   losartan (COZAAR) 25 MG tablet Take 1 tablet (25 mg total) by mouth daily.   losartan-hydrochlorothiazide (HYZAAR) 100-25 MG tablet Take 1 tablet by mouth daily.   No facility-administered encounter medications on file as of 06/06/2021.    Surgical History: Past Surgical History:  Procedure Laterality Date   CARDIAC CATHETERIZATION  2009   normal per pt   carotid US  03/2012   bilateral 40-59% stenosis, rec rpt 1 yr   CHOLECYSTECTOMY N/A 07/06/2017   path showed acute and chronic cholecystitis - Pabon, Saunemin, MD   TONSILLECTOMY AND ADENOIDECTOMY      Medical History: Past Medical History:  Diagnosis Date   Arthritis    Carotid stenosis    Cholecystitis, acute 07/06/2017   Colon polyp    Diabetes mellitus type 2, uncontrolled, without complications    ED (erectile dysfunction)    Hyperlipemia    Hypertension    Left fibular fracture 01/17/2015    Family History: Family History  Problem Relation Age of Onset   Rheum arthritis Father    Hyperlipidemia Father    Heart disease Father    Stroke Father    Diabetes Father    Arthritis Father    Coronary artery disease Father    Breast cancer Mother    Cancer Mother        breast   Heart disease  Brother    Coronary artery disease Brother    Hyperlipidemia Brother    Diabetes Brother     Social History   Socioeconomic History   Marital status: Married    Spouse name: Not on file   Number of children: 0   Years of education: Not on file   Highest education level: Not on file  Occupational History   Occupation: Truck Education administrator: [employer]    Comment: Chief Operating Officer  Tobacco Use   Smoking status: Never   Smokeless tobacco: Current    Types: Snuff  Substance and Sexual Activity   Alcohol use: No   Drug use: No   Sexual activity: Not on file  Other Topics Concern   Not on file  Social History Narrative   Caffeine: 4-5 sodas/day, 1 cup  coffee/day   Lives with wife, 1 dog.  No children   Occupation: Administrator at [employer]: [employer]   Activity: stays active, mowing grass, walking and lifting weights.   Diet: poor fruits and vegetables, good water intake.  Lots of red meat   Social Determinants of Health   Financial Resource Strain: Not on file  Food Insecurity: Not on file  Transportation Needs: Not on file  Physical Activity: Not on file  Stress: Not on file  Social Connections: Not on file  Intimate Partner Violence: Not on file      Review of Systems  Constitutional:  Negative for activity change, appetite change, chills, fatigue, fever and unexpected weight change.  HENT: Negative.  Negative for congestion, ear pain, rhinorrhea, sore throat and trouble swallowing.   Eyes: Negative.   Respiratory:  Positive for cough. Negative for chest tightness, shortness of breath and wheezing.   Cardiovascular: Negative.  Negative for chest pain.  Gastrointestinal: Negative.  Negative for abdominal pain, blood in stool, constipation, diarrhea, nausea and vomiting.  Endocrine: Negative.   Genitourinary: Negative.  Negative for difficulty urinating, dysuria, frequency, hematuria and urgency.  Musculoskeletal: Negative.  Negative for arthralgias, back pain, joint swelling, myalgias and neck pain.  Skin: Negative.  Negative for rash and wound.  Allergic/Immunologic: Negative.  Negative for immunocompromised state.  Neurological: Negative.  Negative for dizziness, seizures, numbness and headaches.  Hematological: Negative.   Psychiatric/Behavioral: Negative.  Negative for behavioral problems, self-injury and suicidal ideas. The patient is not nervous/anxious.    Vital Signs: BP 140/69    Pulse 82    Temp 98.6 F (37 C)    Resp 16    Ht 5\' 9"  (1.753 m)    Wt 179 lb 3.2 oz (81.3 kg)    SpO2 97%    BMI 26.46 kg/m    Physical Exam Vitals reviewed.  Constitutional:      General: Mark Davidson is awake. Mark Davidson is not in acute  distress.    Appearance: Normal appearance. Mark Davidson is well-developed, well-groomed and normal weight. Mark Davidson is not ill-appearing or diaphoretic.  HENT:     Head: Normocephalic and atraumatic.     Right Ear: Ear canal and external ear normal. There is impacted cerumen.     Left Ear: Tympanic membrane, ear canal and external ear normal. There is impacted cerumen.     Nose: Nose normal. No congestion or rhinorrhea.     Mouth/Throat:     Lips: Pink.     Mouth: Mucous membranes are moist.     Pharynx: Oropharynx is clear. Uvula midline. No oropharyngeal exudate or posterior oropharyngeal erythema.  Eyes:  General: Lids are normal. Vision grossly intact. Gaze aligned appropriately. No scleral icterus.       Right eye: No discharge.        Left eye: No discharge.     Extraocular Movements: Extraocular movements intact.     Conjunctiva/sclera: Conjunctivae normal.     Pupils: Pupils are equal, round, and reactive to light.     Funduscopic exam:    Right eye: Red reflex present.        Left eye: Red reflex present. Neck:     Thyroid: No thyromegaly.     Vascular: No JVD.     Trachea: Trachea and phonation normal. No tracheal deviation.  Cardiovascular:     Rate and Rhythm: Normal rate and regular rhythm.     Pulses: Normal pulses.     Heart sounds: Normal heart sounds, S1 normal and S2 normal. No murmur heard.   No friction rub. No gallop.  Pulmonary:     Effort: Pulmonary effort is normal. No accessory muscle usage or respiratory distress.     Breath sounds: Normal breath sounds and air entry. No stridor. No wheezing or rales.  Chest:     Chest wall: No tenderness.  Abdominal:     General: Bowel sounds are normal. There is no distension.     Palpations: Abdomen is soft. There is no shifting dullness, fluid wave, mass or pulsatile mass.     Tenderness: There is no abdominal tenderness. There is no guarding or rebound.  Musculoskeletal:        General: No tenderness or deformity. Normal  range of motion.     Cervical back: Normal range of motion and neck supple.     Right lower leg: No edema.     Left lower leg: No edema.  Lymphadenopathy:     Cervical: No cervical adenopathy.  Skin:    General: Skin is warm and dry.     Capillary Refill: Capillary refill takes less than 2 seconds.     Coloration: Skin is not pale.     Findings: No erythema or rash.  Neurological:     Mental Status: Mark Davidson is alert and oriented to person, place, and time.     Cranial Nerves: No cranial nerve deficit.     Motor: No abnormal muscle tone.     Coordination: Coordination normal.     Deep Tendon Reflexes: Reflexes are normal and symmetric.  Psychiatric:        Mood and Affect: Mood and affect normal.        Behavior: Behavior normal. Behavior is cooperative.        Thought Content: Thought content normal.        Judgment: Judgment normal.       Assessment/Plan: 1. Encounter for routine adult health examination with abnormal findings Age-appropriate preventive screenings and vaccinations discussed, annual physical exam completed. Routine labs for health maintenance drawn prior to this office visit and discussed with patient previously. PHM updated.   2. Type 2 diabetes mellitus with hyperglycemia, without long-term current use of insulin (HCC) A1C is 7.2 today which is slightly elevated from his previous A1C of 6.9 in November 2022. Metformin increased to twice daily. Repeat A1C in 3 months.  - POCT glycosylated hemoglobin (Hb A1C)  3. Essential hypertension Losartan restarted.  - losartan (COZAAR) 25 MG tablet; Take 1 tablet (25 mg total) by mouth daily.  Dispense: 90 tablet; Refill: 1  4. Gastroesophageal reflux disease without esophagitis Omeprazole discontinued. Lansoprazole prescribed.  This medication has less GI side effects and is less likely to cause abdominal pain or diarrhea.  - lansoprazole (PREVACID) 30 MG capsule; Take 1 capsule (30 mg total) by mouth daily at 12 noon.   Dispense: 30 capsule; Refill: 1  5. Hyperlipidemia associated with type 2 diabetes mellitus ([place]) Refills ordered  - atorvastatin (LIPITOR) 40 MG tablet; Take 1 tablet (40 mg total) by mouth daily.  Dispense: 90 tablet; Refill: 1  6. Bilateral impacted cerumen Ear lavage was performed in office and the ears were cleared of cerumen. His TM is wnl bilaterally.  - Ear Lavage  7. Dysuria Routine urinalysis done - UA/M w/rflx Culture, Routine - Microscopic Examination      General Counseling: loudon krakow understanding of the findings of todays visit and agrees with plan of treatment. I have discussed any further diagnostic evaluation that may be needed or ordered today. We also reviewed his medications today. Mark Davidson has been encouraged to call the office with any questions or concerns that should arise related to todays visit.    Orders Placed This Encounter  Procedures   Microscopic Examination   UA/M w/rflx Culture, Routine   POCT glycosylated hemoglobin (Hb A1C)   Ear Lavage    Meds ordered this encounter  Medications   lansoprazole (PREVACID) 30 MG capsule    Sig: Take 1 capsule (30 mg total) by mouth daily at 12 noon.    Dispense:  30 capsule    Refill:  1   losartan (COZAAR) 25 MG tablet    Sig: Take 1 tablet (25 mg total) by mouth daily.    Dispense:  90 tablet    Refill:  1   atorvastatin (LIPITOR) 40 MG tablet    Sig: Take 1 tablet (40 mg total) by mouth daily.    Dispense:  90 tablet    Refill:  1    Return in about 3 months (around 09/03/2021) for F/U, Recheck A1C, Harjit Leider PCP.   Total time spent:30 Minutes Time spent includes review of chart, medications, test results, and follow up plan with the patient.    Controlled Substance Database was reviewed by me.  This patient was seen by Jonetta Osgood, FNP-C in collaboration with Dr. Clayborn Bigness as a part of collaborative care agreement.  Brendalee Matthies R. Valetta Fuller, MSN, FNP-C Internal medicine

## 2021-06-07 LAB — MICROSCOPIC EXAMINATION
Bacteria, UA: NONE SEEN
Casts: NONE SEEN /lpf
Epithelial Cells (non renal): NONE SEEN /hpf (ref 0–10)
RBC, Urine: NONE SEEN /hpf (ref 0–2)
WBC, UA: NONE SEEN /hpf (ref 0–5)

## 2021-06-07 LAB — UA/M W/RFLX CULTURE, ROUTINE
Bilirubin, UA: NEGATIVE
Ketones, UA: NEGATIVE
Leukocytes,UA: NEGATIVE
Nitrite, UA: NEGATIVE
Protein,UA: NEGATIVE
RBC, UA: NEGATIVE
Specific Gravity, UA: 1.012 (ref 1.005–1.030)
Urobilinogen, Ur: 0.2 mg/dL (ref 0.2–1.0)
pH, UA: 5.5 (ref 5.0–7.5)

## 2021-06-13 ENCOUNTER — Ambulatory Visit: Admit: 2021-06-13 | Payer: No Typology Code available for payment source | Admitting: Gastroenterology

## 2021-06-13 SURGERY — COLONOSCOPY WITH PROPOFOL
Anesthesia: General

## 2021-06-14 ENCOUNTER — Other Ambulatory Visit: Payer: Self-pay

## 2021-06-14 ENCOUNTER — Ambulatory Visit (INDEPENDENT_AMBULATORY_CARE_PROVIDER_SITE_OTHER): Payer: No Typology Code available for payment source | Admitting: Dermatology

## 2021-06-14 ENCOUNTER — Encounter: Payer: Self-pay | Admitting: Nurse Practitioner

## 2021-06-14 DIAGNOSIS — L821 Other seborrheic keratosis: Secondary | ICD-10-CM

## 2021-06-14 DIAGNOSIS — L578 Other skin changes due to chronic exposure to nonionizing radiation: Secondary | ICD-10-CM | POA: Diagnosis not present

## 2021-06-14 DIAGNOSIS — Z872 Personal history of diseases of the skin and subcutaneous tissue: Secondary | ICD-10-CM

## 2021-06-14 DIAGNOSIS — L720 Epidermal cyst: Secondary | ICD-10-CM

## 2021-06-14 NOTE — Patient Instructions (Addendum)
Melanoma ABCDEs ? ?Melanoma is the most dangerous type of skin cancer, and is the leading cause of death from skin disease.  You are more likely to develop melanoma if you: ?Have light-colored skin, light-colored eyes, or red or blond hair ?Spend a lot of time in the sun ?Tan regularly, either outdoors or in a tanning bed ?Have had blistering sunburns, especially during childhood ?Have a close family member who has had a melanoma ?Have atypical moles or large birthmarks ? ?Early detection of melanoma is key since treatment is typically straightforward and cure rates are extremely high if we catch it early.  ? ?The first sign of melanoma is often a change in a mole or a new dark spot.  The ABCDE system is a way of remembering the signs of melanoma. ? ?A for asymmetry:  The two halves do not match. ?B for border:  The edges of the growth are irregular. ?C for color:  A mixture of colors are present instead of an even brown color. ?D for diameter:  Melanomas are usually (but not always) greater than 6mm - the size of a pencil eraser. ?E for evolution:  The spot keeps changing in size, shape, and color. ? ?Please check your skin once per month between visits. You can use a small mirror in front and a large mirror behind you to keep an eye on the back side or your body.  ? ?If you see any new or changing lesions before your next follow-up, please call to schedule a visit. ? ?Please continue daily skin protection including broad spectrum sunscreen SPF 30+ to sun-exposed areas, reapplying every 2 hours as needed when you're outdoors.  ? ?Staying in the shade or wearing long sleeves, sun glasses (UVA+UVB protection) and wide brim hats (4-inch brim around the entire circumference of the hat) are also recommended for sun protection.   ? ? ?If You Need Anything After Your Visit ? ?If you have any questions or concerns for your doctor, please call our main line at [phone] and press option 4 to reach your doctor's medical  assistant. If no one answers, please leave a voicemail as directed and we will return your call as soon as possible. Messages left after 4 pm will be answered the following business day.  ? ?You may also send us a message via MyChart. We typically respond to MyChart messages within 1-2 business days. ? ?For prescription refills, please ask your pharmacy to contact our office. Our fax number is [phone]. ? ?If you have an urgent issue when the clinic is closed that cannot wait until the next business day, you can page your doctor at the number below.   ? ?Please note that while we do our best to be available for urgent issues outside of office hours, we are not available 24/7.  ? ?If you have an urgent issue and are unable to reach us, you may choose to seek medical care at your doctor's office, retail clinic, urgent care center, or emergency room. ? ?If you have a medical emergency, please immediately call 911 or go to the emergency department. ? ?Pager Numbers ? ?- Dr. Kowalski: [phone] ? ?- Dr. Moye: [phone] ? ?- Dr. Stewart: [phone] ? ?In the event of inclement weather, please call our main line at [phone] for an update on the status of any delays or closures. ? ?Dermatology Medication Tips: ?Please keep the boxes that topical medications come in in order to help keep track of the instructions   about where and how to use these. Pharmacies typically print the medication instructions only on the boxes and not directly on the medication tubes.  ? ?If your medication is too expensive, please contact our office at [phone] option 4 or send us a message through MyChart.  ? ?We are unable to tell what your co-pay for medications will be in advance as this is different depending on your insurance coverage. However, we may be able to find a substitute medication at lower cost or fill out paperwork to get insurance to cover a needed medication.  ? ?If a prior authorization is required to get your  medication covered by your insurance company, please allow us 1-2 business days to complete this process. ? ?Drug prices often vary depending on where the prescription is filled and some pharmacies may offer cheaper prices. ? ?The website www.goodrx.com contains coupons for medications through different pharmacies. The prices here do not account for what the cost may be with help from insurance (it may be cheaper with your insurance), but the website can give you the price if you did not use any insurance.  ?- You can print the associated coupon and take it with your prescription to the pharmacy.  ?- You may also stop by our office during regular business hours and pick up a GoodRx coupon card.  ?- If you need your prescription sent electronically to a different pharmacy, notify our office through [facility] MyChart or by phone at [phone] option 4. ? ? ? ? ?Si Usted Necesita Algo Despu?s de Su Visita ? ?Tambi?n puede enviarnos un mensaje a trav?s de MyChart. Por lo general respondemos a los mensajes de MyChart en el transcurso de 1 a 2 d?as h?biles. ? ?Para renovar recetas, por favor pida a su farmacia que se ponga en contacto con nuestra oficina. Nuestro n?mero de fax es el [phone]. ? ?Si tiene un asunto urgente cuando la cl?nica est? cerrada y que no puede esperar hasta el siguiente d?a h?bil, puede llamar/localizar a su doctor(a) al n?mero que aparece a continuaci?n.  ? ?Por favor, tenga en cuenta que aunque hacemos todo lo posible para estar disponibles para asuntos urgentes fuera del horario de oficina, no estamos disponibles las 24 horas del d?a, los 7 d?as de la semana.  ? ?Si tiene un problema urgente y no puede comunicarse con nosotros, puede optar por buscar atenci?n m?dica  en el consultorio de su doctor(a), en una cl?nica privada, en un centro de atenci?n urgente o en una sala de emergencias. ? ?Si tiene una emergencia m?dica, por favor llame inmediatamente al 911 o vaya a la sala de  emergencias. ? ?N?meros de b?per ? ?- Dr. Kowalski: [phone] ? ?- Dra. Moye: [phone] ? ?- Dra. Stewart: [phone] ? ?En caso de inclemencias del tiempo, por favor llame a nuestra l?nea principal al [phone] para una actualizaci?n sobre el estado de cualquier retraso o cierre. ? ?Consejos para la medicaci?n en dermatolog?a: ?Por favor, guarde las cajas en las que vienen los medicamentos de uso t?pico para ayudarle a seguir las instrucciones sobre d?nde y c?mo usarlos. Las farmacias generalmente imprimen las instrucciones del medicamento s?lo en las cajas y no directamente en los tubos del medicamento.  ? ?Si su medicamento es muy caro, por favor, p?ngase en contacto con nuestra oficina llamando al [phone] y presione la opci?n 4 o env?enos un mensaje a trav?s de MyChart.  ? ?No podemos decirle cu?l ser? su copago por los medicamentos por adelantado ya que   esto es diferente dependiendo de la cobertura de su seguro. Sin embargo, es posible que podamos encontrar un medicamento sustituto a menor costo o llenar un formulario para que el seguro cubra el medicamento que se considera necesario.  ? ?Si se requiere una autorizaci?n previa para que su compa??a de seguros cubra su medicamento, por favor perm?tanos de 1 a 2 d?as h?biles para completar este proceso. ? ?Los precios de los medicamentos var?an con frecuencia dependiendo del lugar de d?nde se surte la receta y alguna farmacias pueden ofrecer precios m?s baratos. ? ?El sitio web www.goodrx.com tiene cupones para medicamentos de diferentes farmacias. Los precios aqu? no tienen en cuenta lo que podr?a costar con la ayuda del seguro (puede ser m?s barato con su seguro), pero el sitio web puede darle el precio si no utiliz? ning?n seguro.  ?- Puede imprimir el cup?n correspondiente y llevarlo con su receta a la farmacia.  ?- Tambi?n puede pasar por nuestra oficina durante el horario de atenci?n regular y recoger una tarjeta de cupones de GoodRx.  ?- Si  necesita que su receta se env?e electr?nicamente a una farmacia diferente, informe a nuestra oficina a trav?s de MyChart de [facility] o por tel?fono llamando al [phone] y presione la opci?n 4. ? ?

## 2021-06-14 NOTE — Progress Notes (Signed)
° °  Follow-Up Visit   Subjective  Mark Davidson is a 71 y.o. male who presents for the following: Follow-up (Patient here today for 2 month ak follow up. Patient reports that area at scalp has cleared. ). The patient has spots, moles and lesions to be evaluated, some may be new or changing and the patient has concerns that these could be cancer.  The following portions of the chart were reviewed this encounter and updated as appropriate:  Tobacco   Allergies   Meds   Problems   Med Hx   Surg Hx   Fam Hx      Review of Systems: No other skin or systemic complaints except as noted in HPI or Assessment and Plan.  Objective  Well appearing patient in no apparent distress; mood and affect are within normal limits.  A focused examination was performed including face, shoulder, neck, scalp. Relevant physical exam findings are noted in the Assessment and Plan.   Assessment & Plan  Epidermal inclusion cyst Right Shoulder - Posterior Benign-appearing. Exam most consistent with an epidermal inclusion cyst. Discussed that a cyst is a benign growth that can grow over time and sometimes get irritated or inflamed. Recommend observation if it is not bothersome. Discussed option of surgical excision to remove it if it is growing, symptomatic, or other changes noted. Please call for new or changing lesions so they can be evaluated.  Seborrheic Keratoses - Stuck-on, waxy, tan-brown papules and/or plaques  - Benign-appearing - Discussed benign etiology and prognosis. - Observe - Call for any changes  Actinic Damage - chronic, secondary to cumulative UV radiation exposure/sun exposure over time - diffuse scaly erythematous macules with underlying dyspigmentation - Recommend daily broad spectrum sunscreen SPF 30+ to sun-exposed areas, reapply every 2 hours as needed.  - Recommend staying in the shade or wearing long sleeves, sun glasses (UVA+UVB protection) and wide brim hats (4-inch brim around the  entire circumference of the hat). - Call for new or changing lesions.  History of PreCancerous Actinic Keratosis  - site(s) of PreCancerous Actinic Keratosis clear today. - these may recur and new lesions may form requiring treatment to prevent transformation into skin cancer - observe for new or changing spots and contact [facility] for appointment if occur - photoprotection with sun protective clothing; sunglasses and broad spectrum sunscreen with SPF of at least 30 + and frequent self skin exams recommended - yearly exams by a dermatologist recommended for persons with history of PreCancerous Actinic Keratoses  Return for 1 year tbse . IRuthell Rummage, CMA, am acting as scribe for Sarina Ser, MD. Documentation: I have reviewed the above documentation for accuracy and completeness, and I agree with the above.  Sarina Ser, MD

## 2021-06-18 NOTE — Telephone Encounter (Signed)
Sent pt a Pharmacist, community message and a vm asking why he wants to change metformin

## 2021-06-23 ENCOUNTER — Encounter: Payer: Self-pay | Admitting: Dermatology

## 2021-06-24 ENCOUNTER — Encounter: Payer: Self-pay | Admitting: Nurse Practitioner

## 2021-06-24 ENCOUNTER — Encounter: Payer: Self-pay | Admitting: Family Medicine

## 2021-07-03 ENCOUNTER — Encounter: Payer: Self-pay | Admitting: Nurse Practitioner

## 2021-07-07 ENCOUNTER — Encounter: Payer: Self-pay | Admitting: Nurse Practitioner

## 2021-07-09 ENCOUNTER — Encounter: Payer: Self-pay | Admitting: Nurse Practitioner

## 2021-07-09 DIAGNOSIS — G8929 Other chronic pain: Secondary | ICD-10-CM

## 2021-07-16 ENCOUNTER — Telehealth: Payer: Self-pay

## 2021-07-16 NOTE — Telephone Encounter (Signed)
Urgent ortho surgery referral sent via proficient to Dr. Leim Fabry w/ [facility] ?

## 2021-07-24 DIAGNOSIS — M25512 Pain in left shoulder: Secondary | ICD-10-CM | POA: Diagnosis not present

## 2021-07-24 DIAGNOSIS — M7552 Bursitis of left shoulder: Secondary | ICD-10-CM | POA: Diagnosis not present

## 2021-07-24 DIAGNOSIS — M7542 Impingement syndrome of left shoulder: Secondary | ICD-10-CM | POA: Diagnosis not present

## 2021-07-24 DIAGNOSIS — M62838 Other muscle spasm: Secondary | ICD-10-CM | POA: Diagnosis not present

## 2021-07-24 DIAGNOSIS — M778 Other enthesopathies, not elsewhere classified: Secondary | ICD-10-CM | POA: Diagnosis not present

## 2021-07-30 ENCOUNTER — Ambulatory Visit (INDEPENDENT_AMBULATORY_CARE_PROVIDER_SITE_OTHER): Payer: No Typology Code available for payment source | Admitting: Family Medicine

## 2021-07-30 ENCOUNTER — Encounter: Payer: Self-pay | Admitting: Family Medicine

## 2021-07-30 VITALS — BP 154/78 | HR 64 | Temp 98.0°F | Ht 68.5 in | Wt 173.5 lb

## 2021-07-30 DIAGNOSIS — R011 Cardiac murmur, unspecified: Secondary | ICD-10-CM

## 2021-07-30 DIAGNOSIS — E785 Hyperlipidemia, unspecified: Secondary | ICD-10-CM

## 2021-07-30 DIAGNOSIS — Z1211 Encounter for screening for malignant neoplasm of colon: Secondary | ICD-10-CM | POA: Diagnosis not present

## 2021-07-30 DIAGNOSIS — E1169 Type 2 diabetes mellitus with other specified complication: Secondary | ICD-10-CM

## 2021-07-30 DIAGNOSIS — E1165 Type 2 diabetes mellitus with hyperglycemia: Secondary | ICD-10-CM | POA: Diagnosis not present

## 2021-07-30 DIAGNOSIS — Z7189 Other specified counseling: Secondary | ICD-10-CM

## 2021-07-30 DIAGNOSIS — K219 Gastro-esophageal reflux disease without esophagitis: Secondary | ICD-10-CM

## 2021-07-30 DIAGNOSIS — E118 Type 2 diabetes mellitus with unspecified complications: Secondary | ICD-10-CM

## 2021-07-30 DIAGNOSIS — G8929 Other chronic pain: Secondary | ICD-10-CM

## 2021-07-30 DIAGNOSIS — M25512 Pain in left shoulder: Secondary | ICD-10-CM

## 2021-07-30 DIAGNOSIS — I10 Essential (primary) hypertension: Secondary | ICD-10-CM | POA: Diagnosis not present

## 2021-07-30 DIAGNOSIS — I6523 Occlusion and stenosis of bilateral carotid arteries: Secondary | ICD-10-CM

## 2021-07-30 DIAGNOSIS — Z0000 Encounter for general adult medical examination without abnormal findings: Secondary | ICD-10-CM

## 2021-07-30 DIAGNOSIS — R195 Other fecal abnormalities: Secondary | ICD-10-CM | POA: Diagnosis not present

## 2021-07-30 MED ORDER — ATORVASTATIN CALCIUM 40 MG PO TABS: 40.0000 mg | ORAL_TABLET | Freq: Every day | ORAL | 3 refills | Status: DC

## 2021-07-30 MED ORDER — LOSARTAN POTASSIUM 50 MG PO TABS: 50.0000 mg | ORAL_TABLET | Freq: Every day | ORAL | 3 refills | Status: DC

## 2021-07-30 MED ORDER — OMEPRAZOLE 20 MG PO CPDR: 20.0000 mg | DELAYED_RELEASE_CAPSULE | Freq: Every day | ORAL | 3 refills | Status: DC

## 2021-07-30 MED ORDER — METFORMIN HCL 500 MG PO TABS: 500.0000 mg | ORAL_TABLET | Freq: Every day | ORAL | 3 refills | Status: DC

## 2021-07-30 NOTE — Assessment & Plan Note (Signed)
Bilateral bruits heard - will order carotid US as due for f/u  ?

## 2021-07-30 NOTE — Assessment & Plan Note (Signed)
Preventative protocols reviewed and updated unless pt declined. Discussed healthy diet and lifestyle.  

## 2021-07-30 NOTE — Assessment & Plan Note (Signed)
He cancelled colonoscopy as was told insurance would not cover prep.  ?Initial GI eval unrevealing - with normal fecal elastase and GI pathogen panel.  ?Overall stable period now, manageable 1 watery stool every other day.  ?

## 2021-07-30 NOTE — Assessment & Plan Note (Signed)
Chronic, remains uncontrolled. Will increase losartan to '50mg'$  daily. He previously did not tolerate metoprolol due to GI upset.  ?

## 2021-07-30 NOTE — Assessment & Plan Note (Signed)
Faint, newly heard today - will monitor for now.  ?

## 2021-07-30 NOTE — Assessment & Plan Note (Signed)
Chronic, stable on current regimen - continue atorvastatin. ?The 10-year ASCVD risk score (Arnett DK, et al., 2019) is: 38.3% ?  Values used to calculate the score: ?    Age: 71 years ?    Sex: Male ?    Is Non-Hispanic African American: No ?    Diabetic: Yes ?    Tobacco smoker: No ?    Systolic Blood Pressure: 038 mmHg ?    Is BP treated: Yes ?    HDL Cholesterol: 42 mg/dL ?    Total Cholesterol: 142 mg/dL  ?

## 2021-07-30 NOTE — Assessment & Plan Note (Signed)
Advanced planning - does not have at home. Would want wife to be HCPOA. Packet provided 2 years ago - again encouraged he continue working on this.  ?

## 2021-07-30 NOTE — Progress Notes (Addendum)
? ? Patient ID: Mark Davidson, male    DOB: 22-Dec-1950, 71 y.o.   MRN: [id number] ? ?This visit was conducted in person. ? ?BP (!) 154/78 (BP Location: Right Arm, Cuff Size: Normal)   Pulse 64   Temp 98 ?F (36.7 ?C) (Temporal)   Ht 5' 8.5" (1.74 m)   Wt 173 lb 8 oz (78.7 kg)   SpO2 98%   BMI 26.00 kg/m?   ?BP Readings from Last 3 Encounters:  ?07/30/21 (!) 154/78  ?06/06/21 140/69  ?05/17/21 (!) 165/76  ? ?CC: CPE ?Subjective:  ? ?HPI: ?Mark Davidson is a 71 y.o. male presenting on 07/30/2021 for Medicare Wellness ? ? ?Did not see health advisor this year.  ?In interim saw PCP through [facility] in [place] for 4 months. Had medicare wellness visit on 06/06/2021.  ?Presents to re establish care.  ? ?Hearing Screening  ? '500Hz'$  '1000Hz'$  '2000Hz'$  '4000Hz'$   ?Right ear '20 25 25 '$ 0  ?Left ear '20 20 25 '$ 0  ?Vision Screening - Comments:: Last eye exam, within past year.   ?[facility] Visit from 07/30/2021 in [facility] [place]  ?PHQ-2 Total Score 0  ? ?  ?  ? ?  07/30/2021  ?  3:24 PM 06/06/2021  ?  2:41 PM 03/07/2021  ?  1:54 PM 11/24/2019  ? 10:47 AM 10/02/2018  ?  8:49 AM  ?Fall Risk   ?Falls in the past year? 0 0 0 0 0  ?Risk for fall due to :   No Fall Risks    ?Follow up   Falls evaluation completed    ? ?Recent [facility] ortho eval for L  shoulder pain s/p L subacromial bursa injection (Kubinski). Also started muscle relaxant - feeling 100% better with this.  ? ?He also saw GI Dr Clotilde Dieter for chronic diarrhea, s/p cholecystectomy 2019, planned EGD/colonoscopy - however he called and cancelled this as states insurance would not cover it or prep. GI pathogen panel and fecal elastase normal. He notes ongoing intermittent diarrhea - first BM of the day is solid, then he has a watery stool after eating. NSAIDs caused abdominal pain and possibly diarrhea. PPI was changed from omeprazole to lasoprazole - he'd like to return to omeprazole PPI.  ? ?Home BP readings running 741O systolic at home.   ? ?Stressful period - mother currently in the hospital recently. To go to SNF for rehab.  ? ?Preventative: ?Colon cancer screening -iFOB neg 09/2018. Pending colonoscopy ([facility]) - pt called and cancelled as his insurance said it would not cover prep.colonoscopy. will do iFOB again.  ?Prostate cancer screening - discussed. Requests checking today. nocturia x3-4/night.  ?No fmhx AAA.  ?Lung cancer screening - not eligible  ?Flu shot yearly  ?COVID vaccine - Moderna 06/2019, booster 02/2020 ?Tetanus - 2012, rpt at [place] ?Pneumovax 08/2017, prevnar 09/2018 ?Shingrix - completed these at [place] ?Advanced planning - does not have at home. Would want wife to be HCPOA. Packet provided 2 years ago - again encouraged he continue working on this.  ?Seat belt use discussed ?Sunscreen use discussed. No changing moles on skin.  ?Non smoker, he does dip  ?Alcohol - none - quit 2021 ?Eye exam - yearly  ?Dentist - yearly - needs to find new eye doctor due to insurance coverage ?Bowels - no constipation. Chronic diarrhea - see above.  ?Bladder - no incontinence  ? ?Caffeine: 4-5 sodas/day, 1 cup coffee/day ?Lives with wife, 1 dog.  No  children ?Occupation: Truck Geophysicist/field seismologist at [employer] ?Edu: GED ?Activity: stays active, mowing grass, walking and lifting weights. ?Diet: poor fruits and vegetables, good water intake.  Lots of red meat ? ?   ? ?Relevant past medical, surgical, family and social history reviewed and updated as indicated. Interim medical history since our last visit reviewed. ?Allergies and medications reviewed and updated. ?Outpatient Medications Prior to Visit  ?Medication Sig Dispense Refill  ? Chlorpheniramine Maleate (ALLERGY RELIEF PO) Take by mouth as needed. OTC    ? diphenhydramine-acetaminophen (TYLENOL PM) 25-500 MG TABS Take 1 tablet by mouth at bedtime.    ? atorvastatin (LIPITOR) 40 MG tablet Take 1 tablet (40 mg total) by mouth daily. 90 tablet 1  ? losartan (COZAAR) 25 MG tablet Take  1 tablet (25 mg total) by mouth daily. 90 tablet 1  ? losartan-hydrochlorothiazide (HYZAAR) 100-25 MG tablet Take 1 tablet by mouth daily.    ? metFORMIN (GLUCOPHAGE) 500 MG tablet Take 1 tablet (500 mg total) by mouth 2 (two) times daily with a meal. (Patient taking differently: Take 500 mg by mouth daily with breakfast.) 180 tablet 1  ? tiZANidine (ZANAFLEX) 2 MG tablet Take 2 mg by mouth at bedtime.    ? lansoprazole (PREVACID) 30 MG capsule Take 1 capsule (30 mg total) by mouth daily at 12 noon. (Patient not taking: Reported on 07/30/2021) 30 capsule 1  ? ?No facility-administered medications prior to visit.  ?  ? ?Per HPI unless specifically indicated in ROS section below ?Review of Systems  ?Constitutional:  Negative for activity change, appetite change, chills, fatigue, fever and unexpected weight change.  ?HENT:  Negative for hearing loss.   ?Eyes:  Negative for visual disturbance.  ?Respiratory:  Negative for cough, chest tightness, shortness of breath and wheezing.   ?Cardiovascular:  Negative for chest pain, palpitations and leg swelling.  ?Gastrointestinal:  Positive for diarrhea (intermittent). Negative for abdominal distention, abdominal pain, blood in stool, constipation, nausea and vomiting.  ?Genitourinary:  Negative for difficulty urinating and hematuria.  ?Musculoskeletal:  Negative for arthralgias, myalgias and neck pain.  ?Skin:  Negative for rash.  ?Neurological:  Negative for dizziness, seizures, syncope and headaches.  ?Hematological:  Negative for adenopathy. Does not bruise/bleed easily.  ?Psychiatric/Behavioral:  Negative for dysphoric mood. The patient is not nervous/anxious.   ? ?Objective:  ?BP (!) 154/78 (BP Location: Right Arm, Cuff Size: Normal)   Pulse 64   Temp 98 ?F (36.7 ?C) (Temporal)   Ht 5' 8.5" (1.74 m)   Wt 173 lb 8 oz (78.7 kg)   SpO2 98%   BMI 26.00 kg/m?   ?Wt Readings from Last 3 Encounters:  ?07/30/21 173 lb 8 oz (78.7 kg)  ?06/06/21 179 lb 3.2 oz (81.3 kg)   ?05/17/21 176 lb (79.8 kg)  ?  ?  ?Physical Exam ?Vitals and nursing note reviewed.  ?Constitutional:   ?   General: He is not in acute distress. ?   Appearance: Normal appearance. He is well-developed. He is not ill-appearing.  ?HENT:  ?   Head: Normocephalic and atraumatic.  ?   Right Ear: Hearing, tympanic membrane, ear canal and external ear normal.  ?   Left Ear: Hearing, tympanic membrane, ear canal and external ear normal.  ?Eyes:  ?   General: No scleral icterus. ?   Extraocular Movements: Extraocular movements intact.  ?   Conjunctiva/sclera: Conjunctivae normal.  ?   Pupils: Pupils are equal, round, and reactive to light.  ?Neck:  ?  Thyroid: No thyroid mass or thyromegaly.  ?   Vascular: Carotid bruit (bilat) present.  ?Cardiovascular:  ?   Rate and Rhythm: Normal rate and regular rhythm.  ?   Pulses: Normal pulses.     ?     Radial pulses are 2+ on the right side and 2+ on the left side.  ?   Heart sounds: Murmur (2/6 systolic) heard.  ?Pulmonary:  ?   Effort: Pulmonary effort is normal. No respiratory distress.  ?   Breath sounds: Normal breath sounds. No wheezing, rhonchi or rales.  ?Abdominal:  ?   General: Bowel sounds are normal. There is no distension.  ?   Palpations: Abdomen is soft. There is no mass.  ?   Tenderness: There is no abdominal tenderness. There is no guarding or rebound.  ?   Hernia: No hernia is present.  ?Musculoskeletal:     ?   General: Normal range of motion.  ?   Cervical back: Normal range of motion and neck supple.  ?   Right lower leg: No edema.  ?   Left lower leg: No edema.  ?Lymphadenopathy:  ?   Cervical: No cervical adenopathy.  ?Skin: ?   General: Skin is warm and dry.  ?   Findings: No rash.  ?Neurological:  ?   General: No focal deficit present.  ?   Mental Status: He is alert and oriented to person, place, and time.  ?Psychiatric:     ?   Mood and Affect: Mood normal.     ?   Behavior: Behavior normal.     ?   Thought Content: Thought content normal.     ?    Judgment: Judgment normal.  ? ?   ?Results for orders placed or performed in visit on 06/06/21  ?Microscopic Examination  ? Urine  ?Result Value Ref Range  ? WBC, UA None seen 0 - 5 /hpf  ? RBC None seen 0 - 2 /hpf  ? Epithelial Cells

## 2021-07-30 NOTE — Assessment & Plan Note (Signed)
Recent A1c above goal - will recheck at f/u visit.  ?Continue metformin '500mg'$  once daily - bid dose may have worsened GI symptoms ?

## 2021-07-30 NOTE — Assessment & Plan Note (Deleted)
Preventative protocols reviewed and updated unless pt declined. Discussed healthy diet and lifestyle.  

## 2021-07-30 NOTE — Assessment & Plan Note (Signed)
Stable period on daily PPI - he finds omeprazole '20mg'$  was more affordable than current lansoprazole '30mg'$  - requests return to omeprazole '20mg'$  daily.  ?

## 2021-07-30 NOTE — Assessment & Plan Note (Signed)
Appreciate ortho care - seen recently with steroid injection with benefit.  ?

## 2021-07-30 NOTE — Patient Instructions (Addendum)
Send [place] dates of shingles shot and tetanus shots (Td vs Tdap).  ?Increase losartan to '50mg'$  daily - take 2 tablets of '25mg'$  until you run out, new dose will be at pharmacy.  ?We will schedule ultrasound of neck arteries to evaluate for blockage.  ?Continue current medicines except as above.  ?Good to see you today ?Return as needed or in 4 months for blood pressure follow up visit.  ? ?Health Maintenance After Age 69 ?After age 38, you are at a higher risk for certain long-term diseases and infections as well as injuries from falls. Falls are a major cause of broken bones and head injuries in people who are older than age 20. Getting regular preventive care can help to keep you healthy and well. Preventive care includes getting regular testing and making lifestyle changes as recommended by your health care provider. Talk with your health care provider about: ?Which screenings and tests you should have. A screening is a test that checks for a disease when you have no symptoms. ?A diet and exercise plan that is right for you. ?What should I know about screenings and tests to prevent falls? ?Screening and testing are the best ways to find a health problem early. Early diagnosis and treatment give you the best chance of managing medical conditions that are common after age 62. Certain conditions and lifestyle choices may make you more likely to have a fall. Your health care provider may recommend: ?Regular vision checks. Poor vision and conditions such as cataracts can make you more likely to have a fall. If you wear glasses, make sure to get your prescription updated if your vision changes. ?Medicine review. Work with your health care provider to regularly review all of the medicines you are taking, including over-the-counter medicines. Ask your health care provider about any side effects that may make you more likely to have a fall. Tell your health care provider if any medicines that you take make you feel dizzy or  sleepy. ?Strength and balance checks. Your health care provider may recommend certain tests to check your strength and balance while standing, walking, or changing positions. ?Foot health exam. Foot pain and numbness, as well as not wearing proper footwear, can make you more likely to have a fall. ?Screenings, including: ?Osteoporosis screening. Osteoporosis is a condition that causes the bones to get weaker and break more easily. ?Blood pressure screening. Blood pressure changes and medicines to control blood pressure can make you feel dizzy. ?Depression screening. You may be more likely to have a fall if you have a fear of falling, feel depressed, or feel unable to do activities that you used to do. ?Alcohol use screening. Using too much alcohol can affect your balance and may make you more likely to have a fall. ?Follow these instructions at home: ?Lifestyle ?Do not drink alcohol if: ?Your health care provider tells you not to drink. ?If you drink alcohol: ?Limit how much you have to: ?0-1 drink a day for women. ?0-2 drinks a day for men. ?Know how much alcohol is in your drink. In the [place] one drink equals one 12 oz bottle of beer (355 mL), one 5 oz glass of wine (148 mL), or one 1? oz glass of hard liquor (44 mL). ?Do not use any products that contain nicotine or tobacco. These products include cigarettes, chewing tobacco, and vaping devices, such as e-cigarettes. If you need help quitting, ask your health care provider. ?Activity ? ?Follow a regular exercise program to stay  fit. This will help you maintain your balance. Ask your health care provider what types of exercise are appropriate for you. ?If you need a cane or walker, use it as recommended by your health care provider. ?Wear supportive shoes that have nonskid soles. ?Safety ? ?Remove any tripping hazards, such as rugs, cords, and clutter. ?Install safety equipment such as grab bars in bathrooms and safety rails on stairs. ?Keep rooms and walkways  well-lit. ?General instructions ?Talk with your health care provider about your risks for falling. Tell your health care provider if: ?You fall. Be sure to tell your health care provider about all falls, even ones that seem minor. ?You feel dizzy, tiredness (fatigue), or off-balance. ?Take over-the-counter and prescription medicines only as told by your health care provider. These include supplements. ?Eat a healthy diet and maintain a healthy weight. A healthy diet includes low-fat dairy products, low-fat (lean) meats, and fiber from whole grains, beans, and lots of fruits and vegetables. ?Stay current with your vaccines. ?Schedule regular health, dental, and eye exams. ?Summary ?Having a healthy lifestyle and getting preventive care can help to protect your health and wellness after age 63. ?Screening and testing are the best way to find a health problem early and help you avoid having a fall. Early diagnosis and treatment give you the best chance for managing medical conditions that are more common for people who are older than age 4. ?Falls are a major cause of broken bones and head injuries in people who are older than age 7. Take precautions to prevent a fall at home. ?Work with your health care provider to learn what changes you can make to improve your health and wellness and to prevent falls. ?This information is not intended to replace advice given to you by your health care provider. Make sure you discuss any questions you have with your health care provider. ?Document Revised: 09/04/2020 Document Reviewed: 09/04/2020 ?Elsevier Patient Education ? [place]. ? ?

## 2021-07-31 ENCOUNTER — Other Ambulatory Visit (INDEPENDENT_AMBULATORY_CARE_PROVIDER_SITE_OTHER): Payer: No Typology Code available for payment source

## 2021-07-31 DIAGNOSIS — Z1211 Encounter for screening for malignant neoplasm of colon: Secondary | ICD-10-CM | POA: Diagnosis not present

## 2021-08-01 ENCOUNTER — Encounter: Payer: Self-pay | Admitting: Family Medicine

## 2021-08-02 ENCOUNTER — Telehealth: Payer: Self-pay | Admitting: Radiology

## 2021-08-02 DIAGNOSIS — R195 Other fecal abnormalities: Secondary | ICD-10-CM

## 2021-08-02 LAB — FECAL OCCULT BLOOD, IMMUNOCHEMICAL: Fecal Occult Bld: POSITIVE — AB

## 2021-08-02 NOTE — Telephone Encounter (Signed)
Noted.  It should be okay to have Dr. Darnell Level address this.  Thanks.  ?

## 2021-08-02 NOTE — Telephone Encounter (Signed)
[facility] called a positive ifob, results given to Dr Damita Dunnings and sent to Dr Danise Mina. ?

## 2021-08-03 ENCOUNTER — Encounter: Payer: Self-pay | Admitting: Family Medicine

## 2021-08-04 NOTE — Telephone Encounter (Signed)
Replied through [facility]. ?GI referral placed.  ?

## 2021-08-06 ENCOUNTER — Telehealth: Payer: Self-pay | Admitting: Gastroenterology

## 2021-08-06 ENCOUNTER — Other Ambulatory Visit: Payer: Self-pay

## 2021-08-06 DIAGNOSIS — K219 Gastro-esophageal reflux disease without esophagitis: Secondary | ICD-10-CM

## 2021-08-06 DIAGNOSIS — K529 Noninfective gastroenteritis and colitis, unspecified: Secondary | ICD-10-CM

## 2021-08-06 MED ORDER — GOLYTELY 236 G PO SOLR: 4000.0000 mL | Freq: Once | ORAL | 0 refills | Status: AC

## 2021-08-06 NOTE — Telephone Encounter (Signed)
Patient calling requesting to reschedule colonoscopy.  ?

## 2021-08-06 NOTE — Progress Notes (Signed)
Return call made to Mr. Mark Davidson to schedule his colonoscopy w/EGD.  He said he had to cancel due to the cost of the [facility].  I told him I would send in Golytely or Nulytely to [facility].  His procedures have been scheduled with Dr. Marius Ditch on 08/28/21 at [facility]. ?

## 2021-08-07 ENCOUNTER — Telehealth: Payer: Self-pay

## 2021-08-07 NOTE — Telephone Encounter (Signed)
CALLED PATIENT NO ANSWER LEFT VOICEMAIL FOR A CALL BACK ? ?

## 2021-08-15 ENCOUNTER — Ambulatory Visit (INDEPENDENT_AMBULATORY_CARE_PROVIDER_SITE_OTHER): Payer: No Typology Code available for payment source

## 2021-08-15 DIAGNOSIS — I6523 Occlusion and stenosis of bilateral carotid arteries: Secondary | ICD-10-CM

## 2021-08-20 ENCOUNTER — Other Ambulatory Visit: Payer: Self-pay | Admitting: Family Medicine

## 2021-08-20 DIAGNOSIS — I6523 Occlusion and stenosis of bilateral carotid arteries: Secondary | ICD-10-CM

## 2021-08-20 NOTE — Assessment & Plan Note (Deleted)
Carotid US 07/3152 - RICA 0-08%, LICA without stenosis ?

## 2021-08-28 ENCOUNTER — Ambulatory Visit
Admission: RE | Admit: 2021-08-28 | Discharge: 2021-08-28 | Disposition: A | Discharge status: Home / Self Care | Payer: No Typology Code available for payment source | Attending: Gastroenterology | Admitting: Gastroenterology

## 2021-08-28 ENCOUNTER — Ambulatory Visit: Payer: No Typology Code available for payment source | Admitting: Anesthesiology

## 2021-08-28 ENCOUNTER — Encounter
Admission: RE | Disposition: A | Discharge status: Home / Self Care | Payer: Self-pay | Source: Home / Self Care | Attending: Gastroenterology

## 2021-08-28 ENCOUNTER — Encounter: Payer: Self-pay | Admitting: Gastroenterology

## 2021-08-28 ENCOUNTER — Other Ambulatory Visit: Payer: Self-pay

## 2021-08-28 DIAGNOSIS — E119 Type 2 diabetes mellitus without complications: Secondary | ICD-10-CM | POA: Diagnosis not present

## 2021-08-28 DIAGNOSIS — K219 Gastro-esophageal reflux disease without esophagitis: Secondary | ICD-10-CM | POA: Diagnosis not present

## 2021-08-28 DIAGNOSIS — K317 Polyp of stomach and duodenum: Secondary | ICD-10-CM | POA: Insufficient documentation

## 2021-08-28 DIAGNOSIS — K644 Residual hemorrhoidal skin tags: Secondary | ICD-10-CM | POA: Insufficient documentation

## 2021-08-28 DIAGNOSIS — K529 Noninfective gastroenteritis and colitis, unspecified: Secondary | ICD-10-CM

## 2021-08-28 DIAGNOSIS — D120 Benign neoplasm of cecum: Secondary | ICD-10-CM | POA: Insufficient documentation

## 2021-08-28 DIAGNOSIS — K635 Polyp of colon: Secondary | ICD-10-CM

## 2021-08-28 DIAGNOSIS — D126 Benign neoplasm of colon, unspecified: Secondary | ICD-10-CM | POA: Diagnosis not present

## 2021-08-28 DIAGNOSIS — K319 Disease of stomach and duodenum, unspecified: Secondary | ICD-10-CM | POA: Insufficient documentation

## 2021-08-28 DIAGNOSIS — E785 Hyperlipidemia, unspecified: Secondary | ICD-10-CM | POA: Diagnosis not present

## 2021-08-28 DIAGNOSIS — K2950 Unspecified chronic gastritis without bleeding: Secondary | ICD-10-CM | POA: Insufficient documentation

## 2021-08-28 DIAGNOSIS — D124 Benign neoplasm of descending colon: Secondary | ICD-10-CM | POA: Diagnosis not present

## 2021-08-28 DIAGNOSIS — K449 Diaphragmatic hernia without obstruction or gangrene: Secondary | ICD-10-CM | POA: Diagnosis not present

## 2021-08-28 DIAGNOSIS — G709 Myoneural disorder, unspecified: Secondary | ICD-10-CM | POA: Diagnosis not present

## 2021-08-28 HISTORY — PX: ESOPHAGOGASTRODUODENOSCOPY: SHX5428

## 2021-08-28 HISTORY — PX: COLONOSCOPY WITH PROPOFOL: SHX5780

## 2021-08-28 LAB — GLUCOSE, CAPILLARY: Glucose-Capillary: 134 mg/dL — ABNORMAL HIGH (ref 70–99)

## 2021-08-28 SURGERY — COLONOSCOPY WITH PROPOFOL
Anesthesia: General

## 2021-08-28 MED ORDER — SODIUM CHLORIDE 0.9 % IV SOLN: INTRAVENOUS | Status: DC

## 2021-08-28 MED ORDER — PROPOFOL 500 MG/50ML IV EMUL
INTRAVENOUS | Status: DC | PRN
  Administered 2021-08-28: 150 ug/kg/min via INTRAVENOUS

## 2021-08-28 MED ORDER — PROPOFOL 10 MG/ML IV BOLUS
INTRAVENOUS | Status: DC | PRN
  Administered 2021-08-28: 50 mg via INTRAVENOUS
  Administered 2021-08-28: 80 mg via INTRAVENOUS

## 2021-08-28 MED ORDER — PROPOFOL 500 MG/50ML IV EMUL
INTRAVENOUS | Status: AC
  Filled 2021-08-28: qty 50

## 2021-08-28 MED ORDER — LIDOCAINE HCL (CARDIAC) PF 100 MG/5ML IV SOSY
PREFILLED_SYRINGE | INTRAVENOUS | Status: DC | PRN
  Administered 2021-08-28: 40 mg via INTRAVENOUS

## 2021-08-28 MED ORDER — DEXMEDETOMIDINE (PRECEDEX) IN NS 20 MCG/5ML (4 MCG/ML) IV SYRINGE
PREFILLED_SYRINGE | INTRAVENOUS | Status: DC | PRN
  Administered 2021-08-28: 8 ug via INTRAVENOUS

## 2021-08-28 NOTE — Op Note (Signed)
[facility] ?Gastroenterology ?Patient Name: Mark Davidson ?Procedure Date: 08/28/2021 7:45 AM ?MRN: [id number] ?Account #: [id number] ?Date of Birth: 04/01/1951 ?Admit Type: Outpatient ?Age: 71 ?Room: [facility] ENDO ROOM 3 ?Gender: Male ?Note Status: Finalized ?Instrument Name: Upper Endoscope [id number] ?Procedure:             Upper GI endoscopy ?Indications:           Diarrhea ?Providers:             Lin Landsman MD, MD ?Referring MD:          [name] (Referring MD) ?Medicines:             General Anesthesia ?Complications:         No immediate complications. Estimated blood loss: None. ?Procedure:             Pre-Anesthesia Assessment: ?                       - Prior to the procedure, a History and Physical was  ?                       performed, and patient medications and allergies were  ?                       reviewed. The patient is competent. The risks and  ?                       benefits of the procedure and the sedation options and  ?                       risks were discussed with the patient. All questions  ?                       were answered and informed consent was obtained.  ?                       Patient identification and proposed procedure were  ?                       verified by the physician, the nurse, the  ?                       anesthesiologist, the anesthetist and the technician  ?                       in the pre-procedure area in the procedure room in the  ?                       endoscopy suite. Mental Status Examination: alert and  ?                       oriented. Airway Examination: normal oropharyngeal  ?                       airway and neck mobility. Respiratory Examination:  ?                       clear to auscultation. CV Examination: normal.  ?  Prophylactic Antibiotics: The patient does not require  ?                       prophylactic antibiotics. Prior Anticoagulants: The  ?                       patient has taken no previous  anticoagulant or  ?                       antiplatelet agents. ASA Grade Assessment: III - A  ?                       patient with severe systemic disease. After reviewing  ?                       the risks and benefits, the patient was deemed in  ?                       satisfactory condition to undergo the procedure. The  ?                       anesthesia plan was to use general anesthesia.  ?                       Immediately prior to administration of medications,  ?                       the patient was re-assessed for adequacy to receive  ?                       sedatives. The heart rate, respiratory rate, oxygen  ?                       saturations, blood pressure, adequacy of pulmonary  ?                       ventilation, and response to care were monitored  ?                       throughout the procedure. The physical status of the  ?                       patient was re-assessed after the procedure. ?                       After obtaining informed consent, the endoscope was  ?                       passed under direct vision. Throughout the procedure,  ?                       the patient's blood pressure, pulse, and oxygen  ?                       saturations were monitored continuously. The Endoscope  ?                       was introduced through the mouth, and advanced to the  ?  third part of duodenum. The upper GI endoscopy was  ?                       accomplished without difficulty. The patient tolerated  ?                       the procedure fairly well. ?Findings: ?     The duodenal bulb and examined duodenum were normal. Biopsies were taken  ?     with a cold forceps for histology. ?     Multiple small pedunculated and sessile polyps with no bleeding and no  ?     stigmata of recent bleeding were found in the gastric fundus consistent  ?     with fundic gland polyps. ?     A small hiatal hernia was present. ?     The gastric body, incisura and gastric antrum were normal.  Biopsies were  ?     taken with a cold forceps for histology. ?     The cardia and gastric fundus were normal on retroflexion. ?     The gastroesophageal junction and examined esophagus were normal. ?Impression:            - Normal duodenal bulb and examined duodenum. Biopsied. ?                       - Multiple gastric polyps. ?                       - Small hiatal hernia. ?                       - Normal gastric body, incisura and antrum. Biopsied. ?                       - Normal gastroesophageal junction and esophagus. ?Recommendation:        - Await pathology results. ?                       - Follow an antireflux regimen indefinitely. ?                       - Continue present medications. ?                       - Proceed with colonoscopy as scheduled ?                       See colonoscopy report ?Procedure Code(s):     --- Professional --- ?                       [phone], Esophagogastroduodenoscopy, flexible,  ?                       transoral; with biopsy, single or multiple ?Diagnosis Code(s):     --- Professional --- ?                       K31.7, Polyp of stomach and duodenum ?                       K44.9, Diaphragmatic hernia without obstruction or  ?  gangrene ?                       R19.7, Diarrhea, unspecified ?CPT copyright 2019 American Medical Association. All rights reserved. ?The codes documented in this report are preliminary and upon coder review may  ?be revised to meet current compliance requirements. ?Dr. Ulyess Mort ?Carolyn Maniscalco Raeanne Gathers MD, MD ?08/28/2021 8:27:32 AM ?This report has been signed electronically. ?Number of Addenda: 0 ?Note Initiated On: 08/28/2021 7:45 AM ?Estimated Blood Loss:  Estimated blood loss: none. ?     [facility] ?

## 2021-08-28 NOTE — Transfer of Care (Signed)
Immediate Anesthesia Transfer of Care Note ? ?Patient: Mark Davidson ? ?Procedure(s) Performed: COLONOSCOPY WITH PROPOFOL ?ESOPHAGOGASTRODUODENOSCOPY (EGD) ? ?Patient Location: Endoscopy Unit ? ?Anesthesia Type:General ? ?Level of Consciousness: drowsy ? ?Airway & Oxygen Therapy: Patient Spontanous Breathing ? ?Post-op Assessment: Report given to RN and Post -op Vital signs reviewed and stable ? ?Post vital signs: Reviewed and stable ? ?Last Vitals:  ?Vitals Value Taken Time  ?BP 118/66 08/28/21 0904  ?Temp    ?Pulse 70 08/28/21 0904  ?Resp 13 08/28/21 0904  ?SpO2 96 % 08/28/21 0904  ? ? ?Last Pain:  ?Vitals:  ? 08/28/21 0904  ?TempSrc:   ?PainSc: Asleep  ?   ? ?  ? ?Complications: No notable events documented. ?

## 2021-08-28 NOTE — Anesthesia Postprocedure Evaluation (Signed)
Anesthesia Post Note ? ?Patient: Mark Davidson ? ?Procedure(s) Performed: COLONOSCOPY WITH PROPOFOL ?ESOPHAGOGASTRODUODENOSCOPY (EGD) ? ?Patient location during evaluation: Endoscopy ?Anesthesia Type: General ?Level of consciousness: awake and alert ?Pain management: pain level controlled ?Vital Signs Assessment: post-procedure vital signs reviewed and stable ?Respiratory status: spontaneous breathing, nonlabored ventilation, respiratory function stable and patient connected to nasal cannula oxygen ?Cardiovascular status: blood pressure returned to baseline and stable ?Postop Assessment: no apparent nausea or vomiting ?Anesthetic complications: no ? ? ?No notable events documented. ? ? ?Last Vitals:  ?Vitals:  ? 08/28/21 0921 08/28/21 0930  ?BP: (!) 117/56 128/64  ?Pulse: 70 84  ?Resp: 10 13  ?Temp: (!) 35.9 ?C   ?SpO2: 97% 98%  ?  ?Last Pain:  ?Vitals:  ? 08/28/21 0930  ?TempSrc:   ?PainSc: 0-No pain  ? ? ?  ?  ?  ?  ?  ?  ? ?Precious Haws Harue Pribble ? ? ? ? ?

## 2021-08-28 NOTE — Anesthesia Preprocedure Evaluation (Signed)
Anesthesia Evaluation  ?Patient identified by MRN, date of birth, ID band ?Patient awake ? ? ? ?Reviewed: ?Allergy & Precautions, NPO status , Patient's Chart, lab work & pertinent test results ? ?History of Anesthesia Complications ?Negative for: history of anesthetic complications ? ?Airway ?Mallampati: III ? ?TM Distance: <3 FB ?Neck ROM: full ? ? ? Dental ? ?(+) Chipped ?  ?Pulmonary ?neg pulmonary ROS, neg shortness of breath, Patient abstained from smoking.,  ?  ?Pulmonary exam normal ? ? ? ? ? ? ? Cardiovascular ?Exercise Tolerance: Good ?(-) anginaNormal cardiovascular exam ? ? ?  ?Neuro/Psych ? Headaches,  Neuromuscular disease negative psych ROS  ? GI/Hepatic ?Neg liver ROS, GERD  Controlled,  ?Endo/Other  ?diabetes, Type 2 ? Renal/GU ?negative Renal ROS  ?negative genitourinary ?  ?Musculoskeletal ? ? Abdominal ?  ?Peds ? Hematology ?negative hematology ROS ?(+)   ?Anesthesia Other Findings ?Past Medical History: ?No date: Arthritis ?No date: Carotid stenosis ?07/06/2017: Cholecystitis, acute ?No date: Colon polyp ?No date: Diabetes mellitus type 2, uncontrolled, without complications ?No date: ED (erectile dysfunction) ?No date: Hyperlipemia ?No date: Hypertension ?01/17/2015: Left fibular fracture ? ?Past Surgical History: ?2009: CARDIAC CATHETERIZATION ?    Comment:  normal per pt ?03/2012: carotid US ?    Comment:  bilateral 40-59% stenosis, rec rpt 1 yr ?07/06/2017: CHOLECYSTECTOMY; N/A ?    Comment:  path showed acute and chronic cholecystitis - Pabon,  ?             Marjory Lies, MD ?No date: COLONOSCOPY ?No date: TONSILLECTOMY AND ADENOIDECTOMY ? ?BMI   ? Body Mass Index: 24.82 kg/m?  ?  ? ? Reproductive/Obstetrics ?negative OB ROS ? ?  ? ? ? ? ? ? ? ? ? ? ? ? ? ?  ?  ? ? ? ? ? ? ? ? ?Anesthesia Physical ?Anesthesia Plan ? ?ASA: 3 ? ?Anesthesia Plan: General  ? ?Post-op Pain Management:   ? ?Induction: Intravenous ? ?PONV Risk Score and Plan: Propofol infusion and  TIVA ? ?Airway Management Planned: Natural Airway and Nasal Cannula ? ?Additional Equipment:  ? ?Intra-op Plan:  ? ?Post-operative Plan:  ? ?Informed Consent: I have reviewed the patients History and Physical, chart, labs and discussed the procedure including the risks, benefits and alternatives for the proposed anesthesia with the patient or authorized representative who has indicated his/her understanding and acceptance.  ? ? ? ?Dental Advisory Given ? ?Plan Discussed with: Anesthesiologist, CRNA and Surgeon ? ?Anesthesia Plan Comments: (Patient consented for risks of anesthesia including but not limited to:  ?- adverse reactions to medications ?- risk of airway placement if required ?- damage to eyes, teeth, lips or other oral mucosa ?- nerve damage due to positioning  ?- sore throat or hoarseness ?- Damage to heart, brain, nerves, lungs, other parts of body or loss of life ? ?Patient voiced understanding.)  ? ? ? ? ? ? ?Anesthesia Quick Evaluation ? ?

## 2021-08-28 NOTE — H&P (Signed)
?Mark Darby, MD ?[address]  ?[address]  ?[place], [place] [address]  ?Main: [phone]  ?Fax: [phone] ?Pager: [phone] ? ?Primary Care Physician:  [name], MD ?Primary Gastroenterologist:  Dr. Cephas Davidson ? ?Pre-Procedure History & Physical: ?HPI:  Mark Davidson is a 71 y.o. male is here for an endoscopy and colonoscopy. ?  ?Past Medical History:  ?Diagnosis Date  ? Arthritis   ? Carotid stenosis   ? Cholecystitis, acute 07/06/2017  ? Colon polyp   ? Diabetes mellitus type 2, uncontrolled, without complications   ? ED (erectile dysfunction)   ? Hyperlipemia   ? Hypertension   ? Left fibular fracture 01/17/2015  ? ? ?Past Surgical History:  ?Procedure Laterality Date  ? CARDIAC CATHETERIZATION  2009  ? normal per pt  ? carotid US  03/2012  ? bilateral 40-59% stenosis, rec rpt 1 yr  ? CHOLECYSTECTOMY N/A 07/06/2017  ? path showed acute and chronic cholecystitis - Pabon, Marjory Lies, MD  ? COLONOSCOPY    ? TONSILLECTOMY AND ADENOIDECTOMY    ? ? ?Prior to Admission medications   ?Medication Sig Start Date End Date Taking? Authorizing Provider  ?losartan (COZAAR) 50 MG tablet Take 1 tablet (50 mg total) by mouth daily. 07/30/21  Yes [name], MD  ?atorvastatin (LIPITOR) 40 MG tablet Take 1 tablet (40 mg total) by mouth daily. 07/30/21   [name], MD  ?Chlorpheniramine Maleate (ALLERGY RELIEF PO) Take by mouth as needed. OTC    [provider]  ?diphenhydramine-acetaminophen (TYLENOL PM) 25-500 MG TABS Take 1 tablet by mouth at bedtime.    [provider]  ?metFORMIN (GLUCOPHAGE) 500 MG tablet Take 1 tablet (500 mg total) by mouth daily with breakfast. 07/30/21   [name], MD  ?omeprazole (PRILOSEC) 20 MG capsule Take 1 capsule (20 mg total) by mouth daily. 07/30/21   [name], MD  ?tiZANidine (ZANAFLEX) 2 MG tablet Take 2 mg by mouth at bedtime. 07/24/21   [provider]  ? ? ?Allergies as of 08/06/2021 - Review Complete 07/30/2021   ?Allergen Reaction Noted  ? Ace inhibitors Cough 08/27/2011  ? Advil [ibuprofen] Diarrhea 07/30/2021  ? Aleve [naproxen] Diarrhea 07/30/2021  ? Carvedilol Other (See Comments) 08/27/2019  ? Metoprolol Nausea Only 06/06/2021  ? Amlodipine Other (See Comments) 03/04/2019  ? ? ?Family History  ?Problem Relation Age of Onset  ? Rheum arthritis Father   ? Hyperlipidemia Father   ? Heart disease Father   ? Stroke Father   ? Diabetes Father   ? Arthritis Father   ? Coronary artery disease Father   ? Breast cancer Mother   ? Cancer Mother   ?     breast  ? Heart disease Brother   ? Coronary artery disease Brother   ? Hyperlipidemia Brother   ? Diabetes Brother   ? ? ?Social History  ? ?Socioeconomic History  ? Marital status: Married  ?  Spouse name: Not on file  ? Number of children: 0  ? Years of education: Not on file  ? Highest education level: Not on file  ?Occupational History  ? Occupation: Administrator  ?  Employer: OTHER  ?  Comment: [employer]  ?Tobacco Use  ? Smoking status: Never  ? Smokeless tobacco: Current  ?  Types: Snuff  ?Vaping Use  ? Vaping Use: Never used  ?Substance and Sexual Activity  ? Alcohol use: No  ? Drug use: No  ? Sexual activity: Not on file  ?  Other Topics Concern  ? Not on file  ?Social History Narrative  ? Caffeine: 4-5 sodas/day, 1 cup coffee/day  ? Lives with wife, 1 dog.  No children  ? Occupation: Truck Geophysicist/field seismologist at [employer]  ? Edu: GED  ? Activity: stays active, mowing grass, walking and lifting weights.  ? Diet: poor fruits and vegetables, good water intake.  Lots of red meat  ? ?Social Determinants of Health  ? ?Financial Resource Strain: Not on file  ?Food Insecurity: Not on file  ?Transportation Needs: Not on file  ?Physical Activity: Not on file  ?Stress: Not on file  ?Social Connections: Not on file  ?Intimate Partner Violence: Not on file  ? ? ?Review of Systems: ?See HPI, otherwise negative ROS ? ?Physical Exam: ?BP (!) 153/68   Pulse 77   Temp (!) 97.3 ?F (36.3 ?C)  (Temporal)   Resp 20   Ht '5\' 10"'$  (1.778 m)   Wt 78.5 kg   SpO2 100%   BMI 24.82 kg/m?  ?General:   Alert,  pleasant and cooperative in NAD ?Head:  Normocephalic and atraumatic. ?Neck:  Supple; no masses or thyromegaly. ?Lungs:  Clear throughout to auscultation.    ?Heart:  Regular rate and rhythm. ?Abdomen:  Soft, nontender and nondistended. Normal bowel sounds, without guarding, and without rebound.   ?Neurologic:  Alert and  oriented x4;  grossly normal neurologically. ? ?Impression/Plan: ?Mark Davidson is here for an endoscopy and colonoscopy to be performed for Loose stools, lower abdominal discomfort, chronic GERD ? ?Risks, benefits, limitations, and alternatives regarding  endoscopy and colonoscopy have been reviewed with the patient.  Questions have been answered.  All parties agreeable. ? ? ?Sherri Sear, MD  08/28/2021, 8:08 AM ?

## 2021-08-28 NOTE — Anesthesia Procedure Notes (Signed)
Date/Time: 08/28/2021 8:15 AM ?Performed by: Doreen Salvage, CRNA ?Pre-anesthesia Checklist: Patient identified, Emergency Drugs available, Suction available and Patient being monitored ?Patient Re-evaluated:Patient Re-evaluated prior to induction ?Oxygen Delivery Method: Nasal cannula ?Induction Type: IV induction ?Dental Injury: Teeth and Oropharynx as per pre-operative assessment  ?Comments: Nasal cannula with etCO2 monitoring ? ? ? ? ?

## 2021-08-28 NOTE — Op Note (Signed)
[facility] ?Gastroenterology ?Patient Name: Mark Davidson ?Procedure Date: 08/28/2021 7:41 AM ?MRN: [id number] ?Account #: [id number] ?Date of Birth: August 16, 1950 ?Admit Type: Outpatient ?Age: 71 ?Room: [facility] ENDO ROOM 3 ?Gender: Male ?Note Status: Finalized ?Instrument Name: Colonoscope [id number] ?Procedure:             Colonoscopy ?Indications:           Chronic diarrhea, Clinically significant diarrhea of  ?                       unexplained origin ?Providers:             Lin Landsman MD, MD ?Referring MD:          [name] (Referring MD) ?Medicines:             General Anesthesia ?Complications:         No immediate complications. Estimated blood loss: None. ?Procedure:             Pre-Anesthesia Assessment: ?                       - Prior to the procedure, a History and Physical was  ?                       performed, and patient medications and allergies were  ?                       reviewed. The patient is competent. The risks and  ?                       benefits of the procedure and the sedation options and  ?                       risks were discussed with the patient. All questions  ?                       were answered and informed consent was obtained.  ?                       Patient identification and proposed procedure were  ?                       verified by the physician, the nurse, the  ?                       anesthesiologist, the anesthetist and the technician  ?                       in the pre-procedure area in the procedure room in the  ?                       endoscopy suite. Mental Status Examination: alert and  ?                       oriented. Airway Examination: normal oropharyngeal  ?                       airway and neck mobility. Respiratory Examination:  ?  clear to auscultation. CV Examination: normal.  ?                       Prophylactic Antibiotics: The patient does not require  ?                       prophylactic antibiotics.  Prior Anticoagulants: The  ?                       patient has taken no previous anticoagulant or  ?                       antiplatelet agents. ASA Grade Assessment: III - A  ?                       patient with severe systemic disease. After reviewing  ?                       the risks and benefits, the patient was deemed in  ?                       satisfactory condition to undergo the procedure. The  ?                       anesthesia plan was to use general anesthesia.  ?                       Immediately prior to administration of medications,  ?                       the patient was re-assessed for adequacy to receive  ?                       sedatives. The heart rate, respiratory rate, oxygen  ?                       saturations, blood pressure, adequacy of pulmonary  ?                       ventilation, and response to care were monitored  ?                       throughout the procedure. The physical status of the  ?                       patient was re-assessed after the procedure. ?                       After obtaining informed consent, the colonoscope was  ?                       passed under direct vision. Throughout the procedure,  ?                       the patient's blood pressure, pulse, and oxygen  ?                       saturations were monitored continuously. The  ?  Colonoscope was introduced through the anus and  ?                       advanced to the the terminal ileum, with  ?                       identification of the appendiceal orifice and IC  ?                       valve. The colonoscopy was performed without  ?                       difficulty. The patient tolerated the procedure well.  ?                       The quality of the bowel preparation was poor. ?Findings: ?     Hemorrhoids were found on perianal exam. ?     The terminal ileum appeared normal. ?     A diminutive polyp was found in the cecum. The polyp was sessile. The  ?     polyp was removed with a  cold biopsy forceps. Resection and retrieval  ?     were complete. Estimated blood loss: none. ?     A 4 mm polyp was found in the descending colon. The polyp was sessile.  ?     The polyp was removed with a cold snare. Resection and retrieval were  ?     complete. Estimated blood loss: none. ?     Normal mucosa was found in the entire colon. Biopsies for histology were  ?     taken with a cold forceps from the entire colon for evaluation of  ?     microscopic colitis. To prevent bleeding after the biopsy, two  ?     hemostatic clips were successfully placed (MR conditional). There was no  ?     bleeding at the end of the procedure. Estimated blood loss was minimal. ?     Non-bleeding external hemorrhoids were found during retroflexion. The  ?     hemorrhoids were large. ?Impression:            - Preparation of the colon was poor. ?                       - Hemorrhoids found on perianal exam. ?                       - The examined portion of the ileum was normal. ?                       - One diminutive polyp in the cecum, removed with a  ?                       cold biopsy forceps. Resected and retrieved. ?                       - One 4 mm polyp in the descending colon, removed with  ?                       a cold snare. Resected and retrieved. ?                       -  Normal mucosa in the entire examined colon.  ?                       Biopsied. Clips (MR conditional) were placed. ?                       - Non-bleeding external hemorrhoids. ?Recommendation:        - Discharge patient to home (with escort). ?                       - Resume previous diet today. ?                       - Continue present medications. ?                       - Await pathology results. ?                       - Repeat colonoscopy in 6 months with 2 day prep  ?                       because the bowel preparation was suboptimal. ?Procedure Code(s):     --- Professional --- ?                       [id number], Colonoscopy, flexible; with  removal of  ?                       tumor(s), polyp(s), or other lesion(s) by snare  ?                       technique ?                       45380, 59, Colonoscopy, flexible; with biopsy, single  ?                       or multiple ?Diagnosis Code(s):     --- Professional --- ?                       K64.4, Residual hemorrhoidal skin tags ?                       K63.5, Polyp of colon ?                       K52.9, Noninfective gastroenteritis and colitis,  ?                       unspecified ?                       R19.7, Diarrhea, unspecified ?CPT copyright 2019 American Medical Association. All rights reserved. ?The codes documented in this report are preliminary and upon coder review may  ?be revised to meet current compliance requirements. ?Dr. Ulyess Mort ?Aren Pryde Raeanne Gathers MD, MD ?08/28/2021 8:56:34 AM ?This report has been signed electronically. ?Number of Addenda: 0 ?Note Initiated On: 08/28/2021 7:41 AM ?Scope Withdrawal Time: 0 hours 20 minutes 12 seconds  ?Total Procedure Duration: 0 hours 23 minutes 45 seconds  ?Estimated Blood Loss:  Estimated  blood loss: none. ?     [facility] ?

## 2021-08-29 ENCOUNTER — Encounter: Payer: Self-pay | Admitting: Gastroenterology

## 2021-08-30 ENCOUNTER — Encounter: Payer: Self-pay | Admitting: Family Medicine

## 2021-08-30 LAB — SURGICAL PATHOLOGY

## 2021-09-02 ENCOUNTER — Encounter: Payer: Self-pay | Admitting: Family Medicine

## 2021-09-03 ENCOUNTER — Telehealth: Payer: Self-pay | Admitting: Gastroenterology

## 2021-09-03 ENCOUNTER — Telehealth: Payer: Self-pay

## 2021-09-03 DIAGNOSIS — K529 Noninfective gastroenteritis and colitis, unspecified: Secondary | ICD-10-CM

## 2021-09-03 NOTE — Telephone Encounter (Signed)
-----   Message from Lin Landsman, MD sent at 08/31/2021  3:15 PM EDT ----- ?Please inform patient that the pathology results from upper endoscopy and colonoscopy came back normal.  Please find out if he is still having diarrhea ? ?RV ?

## 2021-09-03 NOTE — Telephone Encounter (Signed)
Documented in other telephone call  

## 2021-09-03 NOTE — Telephone Encounter (Signed)
Pt left message returning call ?

## 2021-09-03 NOTE — Telephone Encounter (Signed)
Patient verbalized understanding of results. Patient states she is having 3 bowel movements a day the last two days. The first one is solid, 2nd one is loose and the 3rd one is watery.  ?

## 2021-09-03 NOTE — Telephone Encounter (Signed)
Called and left a message for call back  

## 2021-09-04 NOTE — Telephone Encounter (Signed)
I recommend food allergy profile and alpha gal panel ? ?RV ?

## 2021-09-05 ENCOUNTER — Ambulatory Visit: Payer: No Typology Code available for payment source | Admitting: Nurse Practitioner

## 2021-09-05 DIAGNOSIS — K529 Noninfective gastroenteritis and colitis, unspecified: Secondary | ICD-10-CM | POA: Diagnosis not present

## 2021-09-05 NOTE — Addendum Note (Signed)
Addended by: Ulyess Blossom L on: 09/05/2021 09:36 AM ? ? Modules accepted: Orders ? ?

## 2021-09-05 NOTE — Telephone Encounter (Signed)
Patient verbalized understanding of instructions and will come for lab work soon  ?

## 2021-09-09 LAB — ALPHA-GAL PANEL
Allergen Lamb IgE: <0.1 kU/L
Beef IgE: <0.1 kU/L
IgE (Immunoglobulin E), Serum: 13 IU/mL (ref 6–495)
O215-IgE Alpha-Gal: <0.1 kU/L
Pork IgE: <0.1 kU/L

## 2021-09-09 LAB — FOOD ALLERGY PROFILE
Allergen Corn, IgE: <0.1 kU/L
Clam IgE: <0.1 kU/L
Codfish IgE: <0.1 kU/L
Egg White IgE: <0.1 kU/L
Milk IgE: <0.1 kU/L
Peanut IgE: <0.1 kU/L
Scallop IgE: <0.1 kU/L
Sesame Seed IgE: <0.1 kU/L
Shrimp IgE: <0.1 kU/L
Soybean IgE: <0.1 kU/L
Walnut IgE: <0.1 kU/L
Wheat IgE: <0.1 kU/L

## 2021-09-10 ENCOUNTER — Telehealth: Payer: Self-pay

## 2021-09-10 NOTE — Telephone Encounter (Signed)
-----   Message from Lin Landsman, MD sent at 09/09/2021 11:28 PM EDT ----- ?Food allergy profile and notify her panel came back normal.  Please check if patient is still having loose stools, lower abdominal discomfort and bloating and if there is any weight loss ? ?RV ?

## 2021-09-10 NOTE — Telephone Encounter (Signed)
Patient states he has not been having any lower abdominal pain or bloating. He states he has had only one or two loose stools since colonoscopy  ? ?

## 2021-09-17 ENCOUNTER — Ambulatory Visit: Payer: No Typology Code available for payment source | Admitting: Gastroenterology

## 2021-09-18 ENCOUNTER — Encounter: Payer: Self-pay | Admitting: Family Medicine

## 2021-09-26 ENCOUNTER — Encounter: Payer: Self-pay | Admitting: Gastroenterology

## 2021-09-27 ENCOUNTER — Telehealth: Payer: Self-pay | Admitting: Gastroenterology

## 2021-09-27 MED ORDER — RIFAXIMIN 550 MG PO TABS: 550.0000 mg | ORAL_TABLET | Freq: Three times a day (TID) | ORAL | 0 refills | Status: AC

## 2021-09-27 MED ORDER — RIFAXIMIN 550 MG PO TABS: 550.0000 mg | ORAL_TABLET | Freq: Three times a day (TID) | ORAL | 0 refills | Status: DC

## 2021-09-27 NOTE — Telephone Encounter (Signed)
Depending on what patient prefers: We can either send in a prescription for 42 tablets to a different pharmacy  Or send in prescription for 60 pills to the same pharmacy  Hope this helps  RV

## 2021-09-27 NOTE — Telephone Encounter (Signed)
Pharmacy requesting a call for a prescription verification.

## 2021-09-27 NOTE — Telephone Encounter (Signed)
Pharmacist josh states they can not open the bottle and get 42 tablets out of it. He states it comes in a 60 capsule bottle and we would need to write the medication for 60 pills or send it to a different pharmacy. Asked what the copay was for the patient but they have not even ran the medication through insurance

## 2021-09-27 NOTE — Addendum Note (Signed)
Addended by: Ulyess Blossom L on: 09/27/2021 01:16 PM   Modules accepted: Orders

## 2021-09-27 NOTE — Telephone Encounter (Signed)
Patient states to send to [facility] on [address]. Sent to pharmacy

## 2021-09-28 ENCOUNTER — Encounter: Payer: Self-pay | Admitting: Gastroenterology

## 2021-10-01 MED ORDER — DOXYCYCLINE HYCLATE 100 MG PO TABS: 100.0000 mg | ORAL_TABLET | Freq: Two times a day (BID) | ORAL | 0 refills | Status: AC

## 2021-11-07 ENCOUNTER — Encounter: Payer: Self-pay | Admitting: Family Medicine

## 2021-11-08 ENCOUNTER — Ambulatory Visit (INDEPENDENT_AMBULATORY_CARE_PROVIDER_SITE_OTHER): Payer: No Typology Code available for payment source | Admitting: Internal Medicine

## 2021-11-08 ENCOUNTER — Encounter: Payer: Self-pay | Admitting: Internal Medicine

## 2021-11-08 DIAGNOSIS — R42 Dizziness and giddiness: Secondary | ICD-10-CM

## 2021-11-08 MED ORDER — MECLIZINE HCL 25 MG PO TABS: 25.0000 mg | ORAL_TABLET | Freq: Three times a day (TID) | ORAL | 0 refills | Status: DC | PRN

## 2021-11-08 NOTE — Assessment & Plan Note (Signed)
Fairly classic symptoms Nothing to suggest tumor or CVA Doubt the probiotic is related --but he should stop (already stopped the dicyclomine) Reassured--should resolve Meclizine 25 tid prn for now

## 2021-11-08 NOTE — Progress Notes (Signed)
Subjective:    Patient ID: Mark Davidson, male    DOB: 1950/11/15, 71 y.o.   MRN: [id number]  HPI Here due to dizziness  2 days ago--went to [facility] digestive advantage (for possible mixed IBS) Had gotten dicyclomine from GI---but this caused fecal incontinence Then that night--he had severe rotatory vertigo when in bed. Lasted 3-4 minutes Woke with similar symptoms--but not as long Did fine during the day---then recurred in bed again  No specific nausea with the vertgo--but still some stomach discomfort/diarrhea  Current Outpatient Medications on File Prior to Visit  Medication Sig Dispense Refill   atorvastatin (LIPITOR) 40 MG tablet Take 1 tablet (40 mg total) by mouth daily. 90 tablet 3   Chlorpheniramine Maleate (ALLERGY RELIEF PO) Take by mouth as needed. OTC     diphenhydramine-acetaminophen (TYLENOL PM) 25-500 MG TABS Take 1 tablet by mouth at bedtime.     losartan (COZAAR) 50 MG tablet Take 1 tablet (50 mg total) by mouth daily. 90 tablet 3   metFORMIN (GLUCOPHAGE) 500 MG tablet Take 1 tablet (500 mg total) by mouth daily with breakfast. 90 tablet 3   omeprazole (PRILOSEC) 20 MG capsule Take 1 capsule (20 mg total) by mouth daily. 90 capsule 3   No current facility-administered medications on file prior to visit.    Allergies  Allergen Reactions   Ace Inhibitors Cough   Advil [Ibuprofen] Diarrhea   Aleve [Naproxen] Diarrhea   Carvedilol Other (See Comments)    Stomach pain   Metoprolol Nausea Only    Stomach pain, loose stool   Amlodipine Other (See Comments)    Burning/itching of skin without rash    Past Medical History:  Diagnosis Date   Arthritis    Carotid stenosis    Cholecystitis, acute 07/06/2017   Colon polyp    Diabetes mellitus type 2, uncontrolled, without complications    ED (erectile dysfunction)    Hyperlipemia    Hypertension    Left fibular fracture 01/17/2015    Past Surgical History:  Procedure Laterality Date   CARDIAC  CATHETERIZATION  2009   normal per pt   carotid US  03/2012   bilateral 40-59% stenosis, rec rpt 1 yr   CHOLECYSTECTOMY N/A 07/06/2017   path showed acute and chronic cholecystitis - Pabon, [place], MD   COLONOSCOPY     COLONOSCOPY WITH PROPOFOL N/A 08/28/2021   TAs, poor prep, hemorrhoids, rpt 6 mo (Vanga, Tally Due, MD)   ESOPHAGOGASTRODUODENOSCOPY N/A 08/28/2021   gasric polyps, small HH, biopsy with chronic gastritis (Vanga, Tally Due, MD)   TONSILLECTOMY AND ADENOIDECTOMY      Family History  Problem Relation Age of Onset   Rheum arthritis Father    Hyperlipidemia Father    Heart disease Father    Stroke Father    Diabetes Father    Arthritis Father    Coronary artery disease Father    Breast cancer Mother    Cancer Mother        breast   Heart disease Brother    Coronary artery disease Brother    Hyperlipidemia Brother    Diabetes Brother     Social History   Socioeconomic History   Marital status: Married    Spouse name: Not on file   Number of children: 0   Years of education: Not on file   Highest education level: Not on file  Occupational History   Occupation: Truck Education administrator: [employer]    Comment: Chief Operating Officer  Tobacco Use   Smoking status: Never   Smokeless tobacco: Current    Types: Snuff  Vaping Use   Vaping Use: Never used  Substance and Sexual Activity   Alcohol use: No   Drug use: No   Sexual activity: Not on file  Other Topics Concern   Not on file  Social History Narrative   Caffeine: 4-5 sodas/day, 1 cup coffee/day   Lives with wife, 1 dog.  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   Diet: poor fruits and vegetables, good water intake.  Lots of red meat   Social Determinants of Health   Financial Resource Strain: Not on file  Food Insecurity: Not on file  Transportation Needs: Not on file  Physical Activity: Not on file  Stress:  Not on file  Social Connections: Not on file  Intimate Partner Violence: Not on file   Review of Systems No hearing loss Walking fine--no balance problems Left eye is watering for 3-4 days No tinnitus Slight dull frontal headache No recent travel    Objective:   Physical Exam Constitutional:      Appearance: Normal appearance.  HENT:     Right Ear: Tympanic membrane normal.     Left Ear: Tympanic membrane normal.     Ears:     Comments: Partial obstruction with cerumen--but TMs fine Eyes:     Extraocular Movements: Extraocular movements intact.     Pupils: Pupils are equal, round, and reactive to light.     Comments: No nystagmus  Musculoskeletal:     Cervical back: Neck supple.  Lymphadenopathy:     Cervical: No cervical adenopathy.  Neurological:     Mental Status: He is alert and oriented to person, place, and time.     Cranial Nerves: Cranial nerves 2-12 are intact.     Motor: No weakness, tremor or abnormal muscle tone.     Coordination: Coordination is intact. Romberg sign negative. Coordination normal.     Gait: Gait is intact. Gait normal.            Assessment & Plan:

## 2021-11-16 ENCOUNTER — Ambulatory Visit (INDEPENDENT_AMBULATORY_CARE_PROVIDER_SITE_OTHER): Payer: No Typology Code available for payment source | Admitting: Family Medicine

## 2021-11-16 ENCOUNTER — Encounter: Payer: Self-pay | Admitting: Family Medicine

## 2021-11-16 VITALS — BP 120/68 | HR 68 | Temp 97.9°F | Ht 70.0 in | Wt 168.0 lb

## 2021-11-16 DIAGNOSIS — R42 Dizziness and giddiness: Secondary | ICD-10-CM | POA: Diagnosis not present

## 2021-11-16 DIAGNOSIS — K449 Diaphragmatic hernia without obstruction or gangrene: Secondary | ICD-10-CM

## 2021-11-16 DIAGNOSIS — K2100 Gastro-esophageal reflux disease with esophagitis, without bleeding: Secondary | ICD-10-CM

## 2021-11-16 DIAGNOSIS — Z720 Tobacco use: Secondary | ICD-10-CM

## 2021-11-16 DIAGNOSIS — E1169 Type 2 diabetes mellitus with other specified complication: Secondary | ICD-10-CM

## 2021-11-16 DIAGNOSIS — K529 Noninfective gastroenteritis and colitis, unspecified: Secondary | ICD-10-CM | POA: Diagnosis not present

## 2021-11-16 LAB — POCT GLYCOSYLATED HEMOGLOBIN (HGB A1C): Hemoglobin A1C: 6.7 % — AB (ref 4.0–5.6)

## 2021-11-16 NOTE — Assessment & Plan Note (Signed)
Chronic, stable. Continue current regimen of metformin 500mg once daily.  

## 2021-11-16 NOTE — Assessment & Plan Note (Signed)
This is largely resolved, continues as needed meclizine use.

## 2021-11-16 NOTE — Assessment & Plan Note (Signed)
Continues smokeless tobacco.

## 2021-11-16 NOTE — Progress Notes (Signed)
Patient ID: Mark Davidson, male    DOB: 02/05/1951, 71 y.o.   MRN: [id number]  This visit was conducted in person.  BP 120/68   Pulse 68   Temp 97.9 F (36.6 C) (Temporal)   Ht '5\' 10"'$  (1.778 m)   Wt 168 lb (76.2 kg)   SpO2 98%   BMI 24.11 kg/m    CC: DM f/u visit  Subjective:   HPI: Mark Davidson is a 71 y.o. male presenting on 11/16/2021 for Diabetes (Here for f/u.)   Seen last week by Dr Silvio Pate with episode of peripheral vertigo - night time vertigo is better, but daytime wooziness persists. Continues meclizine PRN sparingly.   Notes watery L eye. No significant dry eye.   Sees GI for chronic diarrhea s/p cholecystectomy 2019. Food allergy testing negative, alpha-gal negative. Diarrhea is some better - has had 2 episodes of loose stools in the past week. No abd pain, nausea.  Colonoscopy 08/2021 - poor colon prep, few polyps, rec rpt 6 mo with 2d prep ([facility]).  EGD 08/2021 - multiple gastric polyps, small HH, biopsy with mild reactive duodenopathy and mild chronic active gastritis, H pylori negative.   DM - does not regularly check sugars. Compliant with antihyperglycemic regimen which includes: metformin '500mg'$  daily. Denies low sugars or hypoglycemic symptoms. Denies paresthesias, blurry vision. Last diabetic eye exam 11/2020 - upcoming appt 12/2021. Glucometer brand: doesn't have. Last foot exam: 04/2020 - DUE. DSME: declined. Lab Results  Component Value Date   HGBA1C 6.7 (A) 11/16/2021   Diabetic Foot Exam - Simple   Simple Foot Form Diabetic Foot exam was performed with the following findings: Yes 11/16/2021  4:01 PM  Visual Inspection No deformities, no ulcerations, no other skin breakdown bilaterally: Yes Sensation Testing Intact to touch and monofilament testing bilaterally: Yes Pulse Check Posterior Tibialis and Dorsalis pulse intact bilaterally: Yes Comments    Lab Results  Component Value Date   MICROALBUR <0.7 08/13/2016         Relevant past  medical, surgical, family and social history reviewed and updated as indicated. Interim medical history since our last visit reviewed. Allergies and medications reviewed and updated. Outpatient Medications Prior to Visit  Medication Sig Dispense Refill   atorvastatin (LIPITOR) 40 MG tablet Take 1 tablet (40 mg total) by mouth daily. 90 tablet 3   Chlorpheniramine Maleate (ALLERGY RELIEF PO) Take by mouth as needed. OTC     diphenhydramine-acetaminophen (TYLENOL PM) 25-500 MG TABS Take 1 tablet by mouth at bedtime.     losartan (COZAAR) 50 MG tablet Take 1 tablet (50 mg total) by mouth daily. 90 tablet 3   meclizine (ANTIVERT) 25 MG tablet Take 1 tablet (25 mg total) by mouth 3 (three) times daily as needed for dizziness. 60 tablet 0   metFORMIN (GLUCOPHAGE) 500 MG tablet Take 1 tablet (500 mg total) by mouth daily with breakfast. 90 tablet 3   omeprazole (PRILOSEC) 20 MG capsule Take 1 capsule (20 mg total) by mouth daily. 90 capsule 3   No facility-administered medications prior to visit.     Per HPI unless specifically indicated in ROS section below Review of Systems  Objective:  BP 120/68   Pulse 68   Temp 97.9 F (36.6 C) (Temporal)   Ht '5\' 10"'$  (1.778 m)   Wt 168 lb (76.2 kg)   SpO2 98%   BMI 24.11 kg/m   Wt Readings from Last 3 Encounters:  11/16/21 168 lb (76.2 kg)  11/08/21 170 lb (77.1 kg)  08/28/21 173 lb (78.5 kg)      Physical Exam Vitals and nursing note reviewed.  Constitutional:      Appearance: Normal appearance. He is not ill-appearing.  Eyes:     Extraocular Movements: Extraocular movements intact.     Conjunctiva/sclera: Conjunctivae normal.     Pupils: Pupils are equal, round, and reactive to light.  Cardiovascular:     Rate and Rhythm: Normal rate and regular rhythm.     Pulses: Normal pulses.     Heart sounds: Normal heart sounds. No murmur heard. Pulmonary:     Effort: Pulmonary effort is normal. No respiratory distress.     Breath sounds: Normal  breath sounds. No wheezing, rhonchi or rales.  Musculoskeletal:     Right lower leg: No edema.     Left lower leg: No edema.     Comments: See HPI for foot exam if done  Skin:    General: Skin is warm and dry.     Findings: No rash.  Neurological:     Mental Status: He is alert.  Psychiatric:        Mood and Affect: Mood normal.        Behavior: Behavior normal.       Results for orders placed or performed in visit on 11/16/21  POCT glycosylated hemoglobin (Hb A1C)  Result Value Ref Range   Hemoglobin A1C 6.7 (A) 4.0 - 5.6 %   HbA1c POC (<> result, manual entry)     HbA1c, POC (prediabetic range)     HbA1c, POC (controlled diabetic range)      Assessment & Plan:   Problem List Items Addressed This Visit     Smokeless tobacco use    Continues smokeless tobacco.       Type 2 diabetes mellitus with other specified complication (HCC) - Primary    Chronic, stable. Continue current regimen of metformin '500mg'$  once daily.       Relevant Orders   POCT glycosylated hemoglobin (Hb A1C) (Completed)   GERD (gastroesophageal reflux disease)    Managed on omeprazole '20mg'$  daily.       Chronic diarrhea of unknown origin    So far unrevealing including endoscopy, colonoscopy, as well as food allergy testing and alpha gal allergy testing.  He also had negative fecal elastase previously tested. Appreciate GI care. Patient states diarrhea is somewhat better, only a few loose stools per week. Seems to be tolerating once daily metformin, higher dose possibly worsened chronic diarrhea.      Hiatal hernia without gangrene or obstruction   Vertigo    This is largely resolved, continues as needed meclizine use.        No orders of the defined types were placed in this encounter.  Orders Placed This Encounter  Procedures   POCT glycosylated hemoglobin (Hb A1C)     Patient Instructions  Try lubricating eye drops to left eye (rephresh, hypotears).  Continue current medicines.   Good to see you today.  Return in 4-6 months for diabetes follow up visit.   Follow up plan: Return in about 6 months (around 05/19/2022), or if symptoms worsen or fail to improve.  [name], MD

## 2021-11-16 NOTE — Assessment & Plan Note (Signed)
Managed on omeprazole '20mg'$  daily.

## 2021-11-16 NOTE — Patient Instructions (Addendum)
Try lubricating eye drops to left eye (rephresh, hypotears).  Continue current medicines.  Good to see you today.  Return in 4-6 months for diabetes follow up visit.

## 2021-11-16 NOTE — Assessment & Plan Note (Signed)
So far unrevealing including endoscopy, colonoscopy, as well as food allergy testing and alpha gal allergy testing.  He also had negative fecal elastase previously tested. Appreciate GI care. Patient states diarrhea is somewhat better, only a few loose stools per week. Seems to be tolerating once daily metformin, higher dose possibly worsened chronic diarrhea.

## 2021-11-28 ENCOUNTER — Ambulatory Visit: Payer: No Typology Code available for payment source | Admitting: Gastroenterology

## 2021-11-30 ENCOUNTER — Ambulatory Visit: Payer: No Typology Code available for payment source | Admitting: Family Medicine

## 2022-01-22 DIAGNOSIS — H04123 Dry eye syndrome of bilateral lacrimal glands: Secondary | ICD-10-CM | POA: Diagnosis not present

## 2022-01-22 DIAGNOSIS — H52223 Regular astigmatism, bilateral: Secondary | ICD-10-CM | POA: Diagnosis not present

## 2022-01-22 DIAGNOSIS — H259 Unspecified age-related cataract: Secondary | ICD-10-CM | POA: Diagnosis not present

## 2022-01-22 DIAGNOSIS — H5203 Hypermetropia, bilateral: Secondary | ICD-10-CM | POA: Diagnosis not present

## 2022-01-22 DIAGNOSIS — H524 Presbyopia: Secondary | ICD-10-CM | POA: Diagnosis not present

## 2022-01-22 LAB — HM DIABETES EYE EXAM

## 2022-04-01 ENCOUNTER — Encounter: Payer: Self-pay | Admitting: Family Medicine

## 2022-04-01 ENCOUNTER — Ambulatory Visit (INDEPENDENT_AMBULATORY_CARE_PROVIDER_SITE_OTHER): Payer: No Typology Code available for payment source | Admitting: Family Medicine

## 2022-04-01 VITALS — BP 138/64 | HR 65 | Temp 97.6°F | Ht 70.0 in | Wt 175.6 lb

## 2022-04-01 DIAGNOSIS — R0789 Other chest pain: Secondary | ICD-10-CM

## 2022-04-01 DIAGNOSIS — R1013 Epigastric pain: Secondary | ICD-10-CM

## 2022-04-01 DIAGNOSIS — K2100 Gastro-esophageal reflux disease with esophagitis, without bleeding: Secondary | ICD-10-CM

## 2022-04-01 DIAGNOSIS — K529 Noninfective gastroenteritis and colitis, unspecified: Secondary | ICD-10-CM

## 2022-04-01 DIAGNOSIS — E1169 Type 2 diabetes mellitus with other specified complication: Secondary | ICD-10-CM | POA: Diagnosis not present

## 2022-04-01 LAB — POCT GLYCOSYLATED HEMOGLOBIN (HGB A1C): Hemoglobin A1C: 6.9 % — AB (ref 4.0–5.6)

## 2022-04-01 NOTE — Assessment & Plan Note (Addendum)
Associated with chronic diarrhea, ?IBS.  Encouraged GI f/u as rpt colonoscopy due for poor prep.  Reasonable to try IBguard PRN AC.

## 2022-04-01 NOTE — Assessment & Plan Note (Addendum)
Exam today suspicious for MSK chest wall pain, r/o inflammatory arthritis.  Hesitant to use oral NSAIDs in h/o gastritis or prednisone in diabetic.  May continue heat, ice, scheduled tylenol, discussed starting topical NSAID voltaren TID for 2 wks, update with effect.  Consider labwork if not improved with treatment.  Story not consistent with cardiac chest pain.

## 2022-04-01 NOTE — Assessment & Plan Note (Signed)
Stable period on omeprazole 20mg daily.  

## 2022-04-01 NOTE — Assessment & Plan Note (Addendum)
Due for rpt colonoscopy - # provided to call and schedule f/u with [facility].

## 2022-04-01 NOTE — Patient Instructions (Addendum)
Call [facility] to ask about repeat colonoscopy as I think you're due - at [phone] to schedule an appointment.  We will request latest eye exam from [facility] in [place]  For chest wall pain - try topical voltaren (diclofenac) cream to painful area 3 times a day.  For stomach symptoms, may try IB Guard over the counter daily as needed with meals.  Let us know if not improving with this. Good to see you today. Continue current medicines.  Return in 4 months for physical.

## 2022-04-01 NOTE — Progress Notes (Signed)
Patient ID: Mark Davidson, male    DOB: [birth year], 71 y.o.   MRN: [id number]  This visit was conducted in person.  BP 138/64   Pulse 65   Temp 97.6 F (36.4 C) (Temporal)   Ht '5\' 10"'$  (1.778 m)   Wt 175 lb 9.6 oz (79.7 kg)   SpO2 98%   BMI 25.20 kg/m    CC: DM f/u visit  Subjective:   HPI: Mark Davidson is a 71 y.o. male presenting on 04/01/2022 for Diabetes (Here for 4-6 mos f/u.)   DM - does not regularly check sugars. Compliant with antihyperglycemic regimen which includes: metformin '500mg'$  daily. Denies low sugars or hypoglycemic symptoms. Denies chest pain, paresthesias, blurry vision. Last diabetic eye exam 11/2020 - DUE. Glucometer brand: doesn't have one at home. Last foot exam: 10/2021. DSME: declined. Lab Results  Component Value Date   HGBA1C 6.9 (A) 04/01/2022   Diabetic Foot Exam - Simple   Simple Foot Form Diabetic Foot exam was performed with the following findings: Yes 04/01/2022  4:12 PM  Visual Inspection No deformities, no ulcerations, no other skin breakdown bilaterally: Yes Sensation Testing Intact to touch and monofilament testing bilaterally: Yes Pulse Check Posterior Tibialis and Dorsalis pulse intact bilaterally: Yes Comments    Lab Results  Component Value Date   MICROALBUR <0.7 08/13/2016    Possible IBS - dicyclomine caused fecal incontinence accident.   Notes ongoing left shoulder pain that is intermittent. He previously saw ortho earlier this year s/p steroid injection with benefit.  Started after lots of heavy lifting he had to do with recent move 6 months ago.  No chest pain, dyspnea, dizziness, or jaw pain.  Has tried ice, heat, biofreeze, ES tylenol BID.      Relevant past medical, surgical, family and social history reviewed and updated as indicated. Interim medical history since our last visit reviewed. Allergies and medications reviewed and updated. Outpatient Medications Prior to Visit  Medication Sig Dispense Refill    atorvastatin (LIPITOR) 40 MG tablet Take 1 tablet (40 mg total) by mouth daily. 90 tablet 3   Chlorpheniramine Maleate (ALLERGY RELIEF PO) Take by mouth as needed. OTC     diphenhydramine-acetaminophen (TYLENOL PM) 25-500 MG TABS Take 1 tablet by mouth at bedtime.     losartan (COZAAR) 50 MG tablet Take 1 tablet (50 mg total) by mouth daily. 90 tablet 3   meclizine (ANTIVERT) 25 MG tablet Take 1 tablet (25 mg total) by mouth 3 (three) times daily as needed for dizziness. 60 tablet 0   metFORMIN (GLUCOPHAGE) 500 MG tablet Take 1 tablet (500 mg total) by mouth daily with breakfast. 90 tablet 3   omeprazole (PRILOSEC) 20 MG capsule Take 1 capsule (20 mg total) by mouth daily. 90 capsule 3   No facility-administered medications prior to visit.     Per HPI unless specifically indicated in ROS section below Review of Systems  Objective:  BP 138/64   Pulse 65   Temp 97.6 F (36.4 C) (Temporal)   Ht '5\' 10"'$  (1.778 m)   Wt 175 lb 9.6 oz (79.7 kg)   SpO2 98%   BMI 25.20 kg/m   Wt Readings from Last 3 Encounters:  04/01/22 175 lb 9.6 oz (79.7 kg)  11/16/21 168 lb (76.2 kg)  11/08/21 170 lb (77.1 kg)      Physical Exam Vitals and nursing note reviewed.  Constitutional:      Appearance: Normal appearance. He is not ill-appearing.  Eyes:     Extraocular Movements: Extraocular movements intact.     Conjunctiva/sclera: Conjunctivae normal.     Pupils: Pupils are equal, round, and reactive to light.  Cardiovascular:     Rate and Rhythm: Normal rate and regular rhythm.     Pulses: Normal pulses.     Heart sounds: Normal heart sounds. No murmur heard. Pulmonary:     Effort: Pulmonary effort is normal. No respiratory distress.     Breath sounds: Normal breath sounds. No wheezing, rhonchi or rales.  Chest:     Chest wall: Tenderness present.       Comments: Reproducible tenderness to palpation predominantly at costoclavicular juncture  Musculoskeletal:     Right lower leg: No edema.      Left lower leg: No edema.     Comments:  FROM at bilateral shoulders See HPI for foot exam if done  Skin:    General: Skin is warm and dry.     Findings: No rash.  Neurological:     Mental Status: He is alert.  Psychiatric:        Mood and Affect: Mood normal.        Behavior: Behavior normal.       Results for orders placed or performed in visit on 04/01/22  POCT glycosylated hemoglobin (Hb A1C)  Result Value Ref Range   Hemoglobin A1C 6.9 (A) 4.0 - 5.6 %   HbA1c POC (<> result, manual entry)     HbA1c, POC (prediabetic range)     HbA1c, POC (controlled diabetic range)     Lab Results  Component Value Date   CREATININE 1.17 05/04/2021   BUN 16 05/04/2021   NA 136 05/04/2021   K 4.1 05/04/2021   CL 96 05/04/2021   CO2 26 05/04/2021   Lab Results  Component Value Date   CHOL 142 05/04/2021   HDL 42 05/04/2021   LDLCALC 82 05/04/2021   LDLDIRECT 85 07/12/2016   TRIG 99 05/04/2021   CHOLHDL 3.4 05/04/2021   Assessment & Plan:   Problem List Items Addressed This Visit       Unprioritized   Type 2 diabetes mellitus with other specified complication ([place]) - Primary    Chronic, overall stable - continue metformin '500mg'$  once daily.       Relevant Orders   POCT glycosylated hemoglobin (Hb A1C) (Completed)   Left-sided chest wall pain    Exam today suspicious for MSK chest wall pain, r/o inflammatory arthritis.  Hesitant to use oral NSAIDs in h/o gastritis or prednisone in diabetic.  May continue heat, ice, scheduled tylenol, discussed starting topical NSAID voltaren TID for 2 wks, update with effect.  Consider labwork if not improved with treatment.  Story not consistent with cardiac chest pain.       GERD (gastroesophageal reflux disease)    Stable period on omeprazole '20mg'$  daily.       Epigastric discomfort    Associated with chronic diarrhea, ?IBS.  Encouraged GI f/u as rpt colonoscopy due for poor prep.  Reasonable to try IBguard PRN AC.        Chronic diarrhea of unknown origin    Due for rpt colonoscopy - # provided to call and schedule f/u with [facility].        No orders of the defined types were placed in this encounter.  Orders Placed This Encounter  Procedures   POCT glycosylated hemoglobin (Hb A1C)     Patient Instructions  Call  GI  to ask about repeat colonoscopy as I think you're due - at [phone] to schedule an appointment.  We will request latest eye exam from [facility] in [place]  For chest wall pain - try topical voltaren (diclofenac) cream to painful area 3 times a day.  For stomach symptoms, may try IB Guard over the counter daily as needed with meals.  Let us know if not improving with this. Good to see you today. Continue current medicines.  Return in 4 months for physical.   Follow up plan: Return in about 4 months (around 08/01/2022) for annual exam, prior fasting for blood work, medicare wellness visit.  [name], MD

## 2022-04-01 NOTE — Assessment & Plan Note (Signed)
Chronic, overall stable - continue metformin '500mg'$  once daily.

## 2022-04-03 ENCOUNTER — Encounter: Payer: Self-pay | Admitting: Family Medicine

## 2022-04-09 ENCOUNTER — Encounter: Payer: Self-pay | Admitting: Family Medicine

## 2022-04-09 NOTE — Telephone Encounter (Signed)
Spoke w pt - burning sensation in chest and throat after taking IBgard - rec stop med, increase omeprazole to '20mg'$  BID for next few days then return  to once daily 30 min before meal.

## 2022-04-09 NOTE — Telephone Encounter (Signed)
Plz triage pt.  °

## 2022-04-09 NOTE — Telephone Encounter (Addendum)
Pt said started taking IB Guard on 04/03/22.pt said he takes one pill of IB Guard before eating in AM before breakfast and at bedtime before eating a snack. Pt said no N&V  and no heartburn, no H/A, no sores or irritation in mouth and no severe abd pain. Pt said upper abd feels like something is clawing at lining of abd. Pt said last March GI did endoscopy. Pt said he has not contacted GI since Dr Darnell Level mentioned the IB Guard. Pt does not have fever, no constipation or diarrhea now. Pt has not seen any blood. Pt also said voltaren cream was helping lt sided chest pain between breast and shoulder. Pt request cb after Dr Darnell Level reviews note and pt said tomorrow would be OK for cb. UC & ED precautions given and pt voiced understanding. Sending note to Dr Darnell Level and G pool and will teams [place] CMA. [place].

## 2022-04-29 ENCOUNTER — Encounter: Payer: Self-pay | Admitting: Gastroenterology

## 2022-04-29 ENCOUNTER — Encounter: Payer: Self-pay | Admitting: Family Medicine

## 2022-04-29 DIAGNOSIS — R1010 Upper abdominal pain, unspecified: Secondary | ICD-10-CM

## 2022-04-30 NOTE — Telephone Encounter (Signed)
Please review the Guidelines they do not want upper abdominal pain. Dr. Marius Ditch said to put Epigastric pain. Called and got patient schedule for 05/06/2022 Arrived to out patient imaging at 1:15 for a 3:30 scan Nothing to eat or drink after 11:30am. Called patient and patient verbalized understanding. He states he does not want to scheduled colonoscopy at this time. He states he will wait to see what the CT shows because he can not afford to do it

## 2022-05-02 ENCOUNTER — Other Ambulatory Visit: Payer: Self-pay | Admitting: Family Medicine

## 2022-05-02 DIAGNOSIS — K2100 Gastro-esophageal reflux disease with esophagitis, without bleeding: Secondary | ICD-10-CM

## 2022-05-06 ENCOUNTER — Ambulatory Visit
Admission: RE | Admit: 2022-05-06 | Discharge: 2022-05-06 | Disposition: A | Discharge status: Home / Self Care | Payer: No Typology Code available for payment source | Source: Ambulatory Visit | Attending: Gastroenterology | Admitting: Gastroenterology

## 2022-05-06 DIAGNOSIS — R1010 Upper abdominal pain, unspecified: Secondary | ICD-10-CM | POA: Insufficient documentation

## 2022-05-06 DIAGNOSIS — I700 Atherosclerosis of aorta: Secondary | ICD-10-CM | POA: Diagnosis not present

## 2022-05-06 DIAGNOSIS — K7689 Other specified diseases of liver: Secondary | ICD-10-CM | POA: Diagnosis not present

## 2022-05-06 DIAGNOSIS — Z9049 Acquired absence of other specified parts of digestive tract: Secondary | ICD-10-CM | POA: Diagnosis not present

## 2022-05-06 DIAGNOSIS — D1803 Hemangioma of intra-abdominal structures: Secondary | ICD-10-CM | POA: Diagnosis not present

## 2022-05-06 MED ORDER — IOHEXOL 300 MG/ML  SOLN
100.0000 mL | Freq: Once | INTRAMUSCULAR | Status: AC | PRN
  Administered 2022-05-06: 100 mL via INTRAVENOUS

## 2022-05-08 ENCOUNTER — Telehealth: Payer: Self-pay

## 2022-05-08 NOTE — Telephone Encounter (Addendum)
Patient states he does not have a gallbladder  and got it removed on 07/06/2017 so he does not need the ultrasound or the Hida scan

## 2022-05-08 NOTE — Telephone Encounter (Signed)
-----   Message from Lin Landsman, MD sent at 05/07/2022  2:40 PM EST ----- Please inform patient that the CT of his abdomen came back normal, recommend right upper quadrant ultrasound to evaluate his gallbladder.  If gallbladder is normal, recommend HIDA scan to assess gallbladder function  Rohini [facility]

## 2022-05-08 NOTE — Telephone Encounter (Signed)
Called and left a message for call back  

## 2022-05-27 ENCOUNTER — Ambulatory Visit: Payer: No Typology Code available for payment source | Admitting: Nurse Practitioner

## 2022-06-03 ENCOUNTER — Telehealth: Payer: Self-pay | Admitting: Family Medicine

## 2022-06-03 NOTE — Telephone Encounter (Signed)
LVM for pt to rtn my call to schedule AWV with [facility].  

## 2022-06-19 ENCOUNTER — Encounter: Payer: Self-pay | Admitting: Family Medicine

## 2022-06-19 NOTE — Telephone Encounter (Signed)
Pt called back returning a missed call from our office. Didn't see any notes of anyone calling the pt. Call [phone] # [phone]

## 2022-06-20 ENCOUNTER — Ambulatory Visit: Payer: No Typology Code available for payment source | Admitting: Dermatology

## 2022-06-20 VITALS — BP 147/74 | HR 65

## 2022-06-20 DIAGNOSIS — L219 Seborrheic dermatitis, unspecified: Secondary | ICD-10-CM | POA: Diagnosis not present

## 2022-06-20 DIAGNOSIS — Z7189 Other specified counseling: Secondary | ICD-10-CM

## 2022-06-20 DIAGNOSIS — Z79899 Other long term (current) drug therapy: Secondary | ICD-10-CM | POA: Diagnosis not present

## 2022-06-20 DIAGNOSIS — L578 Other skin changes due to chronic exposure to nonionizing radiation: Secondary | ICD-10-CM

## 2022-06-20 MED ORDER — KETOCONAZOLE 2 % EX SHAM: MEDICATED_SHAMPOO | CUTANEOUS | 6 refills | Status: DC

## 2022-06-20 MED ORDER — MOMETASONE FUROATE 0.1 % EX SOLN: CUTANEOUS | 6 refills | Status: DC

## 2022-06-20 NOTE — Progress Notes (Signed)
   Follow-Up Visit   Subjective  Mark Davidson is a 72 y.o. male who presents for the following: Rash (Patient c/o itchy skin on his face and scalp for several months now, worse during the winter. Past treatment Ketoconazole shampoo helped years ago).  The following portions of the chart were reviewed this encounter and updated as appropriate:   Tobacco  Allergies  Meds  Problems  Med Hx  Surg Hx  Fam Hx     Review of Systems:  No other skin or systemic complaints except as noted in HPI or Assessment and Plan.  Objective  Well appearing patient in no apparent distress; mood and affect are within normal limits.  A focused examination was performed including face,neck,scalp. Relevant physical exam findings are noted in the Assessment and Plan.  scalp Pink patches with greasy scale.    Assessment & Plan  Seborrheic dermatitis scalp Seborrheic Dermatitis with Pruritus   - Seborrheic dermatitis is a chronic persistent rash characterized by pinkness and scaling most commonly of the mid face but also can occur on the scalp (dandruff), ears; mid chest, mid back and groin.  It tends to be exacerbated by stress and cooler weather.  People who have neurologic disease may experience new onset or exacerbation of existing seborrheic dermatitis.  The condition is not curable but treatable and can be controlled.   Start Mometasone solution apply to affected skin 3-5 days a week at bedtime, prn itch Start Ketoconazole 2% cream apply three times per week, massage into scalp and leave in for 10 minutes before rinsing out   Related Medications ketoconazole (NIZORAL) 2 % shampoo apply three times per week, massage into scalp and leave in for 10 minutes before rinsing out  mometasone (ELOCON) 0.1 % lotion Apply to affected itchy skin 3-5 days a week at bedtime prn itch  Actinic Damage - Chronic condition, secondary to cumulative UV/sun exposure - diffuse scaly erythematous macules with  underlying dyspigmentation - Recommend daily broad spectrum sunscreen SPF 30+ to sun-exposed areas, reapply every 2 hours as needed.  - Staying in the shade or wearing long sleeves, sun glasses (UVA+UVB protection) and wide brim hats (4-inch brim around the entire circumference of the hat) are also recommended for sun protection.  - Call for new or changing lesions.  Return in about 3 months (around 09/18/2022) for Seborrheic dermatitis.  IMarye Round, CMA, am acting as scribe for Sarina Ser, MD .  Documentation: I have reviewed the above documentation for accuracy and completeness, and I agree with the above.  Sarina Ser, MD

## 2022-06-20 NOTE — Patient Instructions (Signed)
Due to recent changes in healthcare laws, you may see results of your pathology and/or laboratory studies on MyChart before the doctors have had a chance to review them. We understand that in some cases there may be results that are confusing or concerning to you. Please understand that not all results are received at the same time and often the doctors may need to interpret multiple results in order to provide you with the best plan of care or course of treatment. Therefore, we ask that you please give us 2 business days to thoroughly review all your results before contacting the office for clarification. Should we see a critical lab result, you will be contacted sooner.   If You Need Anything After Your Visit  If you have any questions or concerns for your doctor, please call our main line at [phone] and press option 4 to reach your doctor's medical assistant. If no one answers, please leave a voicemail as directed and we will return your call as soon as possible. Messages left after 4 pm will be answered the following business day.   You may also send us a message via MyChart. We typically respond to MyChart messages within 1-2 business days.  For prescription refills, please ask your pharmacy to contact our office. Our fax number is [phone].  If you have an urgent issue when the clinic is closed that cannot wait until the next business day, you can page your doctor at the number below.    Please note that while we do our best to be available for urgent issues outside of office hours, we are not available 24/7.   If you have an urgent issue and are unable to reach us, you may choose to seek medical care at your doctor's office, retail clinic, urgent care center, or emergency room.  If you have a medical emergency, please immediately call 911 or go to the emergency department.  Pager Numbers  - Dr. Kowalski: [phone]  - Dr. Moye: [phone]  - Dr. Stewart:  [phone]  In the event of inclement weather, please call our main line at [phone] for an update on the status of any delays or closures.  Dermatology Medication Tips: Please keep the boxes that topical medications come in in order to help keep track of the instructions about where and how to use these. Pharmacies typically print the medication instructions only on the boxes and not directly on the medication tubes.   If your medication is too expensive, please contact our office at [phone] option 4 or send us a message through MyChart.   We are unable to tell what your co-pay for medications will be in advance as this is different depending on your insurance coverage. However, we may be able to find a substitute medication at lower cost or fill out paperwork to get insurance to cover a needed medication.   If a prior authorization is required to get your medication covered by your insurance company, please allow us 1-2 business days to complete this process.  Drug prices often vary depending on where the prescription is filled and some pharmacies may offer cheaper prices.  The website www.goodrx.com contains coupons for medications through different pharmacies. The prices here do not account for what the cost may be with help from insurance (it may be cheaper with your insurance), but the website can give you the price if you did not use any insurance.  - You can print the associated coupon and take it with   your prescription to the pharmacy.  - You may also stop by our office during regular business hours and pick up a GoodRx coupon card.  - If you need your prescription sent electronically to a different pharmacy, notify our office through [facility] MyChart or by phone at [phone] option 4.     Si Usted Necesita Algo Despus de Su Visita  Tambin puede enviarnos un mensaje a travs de MyChart. Por lo general respondemos a los mensajes de MyChart en el transcurso de 1 a 2  das hbiles.  Para renovar recetas, por favor pida a su farmacia que se ponga en contacto con nuestra oficina. Nuestro nmero de fax es el [phone].  Si tiene un asunto urgente cuando la clnica est cerrada y que no puede esperar hasta el siguiente da hbil, puede llamar/localizar a su doctor(a) al nmero que aparece a continuacin.   Por favor, tenga en cuenta que aunque hacemos todo lo posible para estar disponibles para asuntos urgentes fuera del horario de oficina, no estamos disponibles las 24 horas del da, los 7 das de la semana.   Si tiene un problema urgente y no puede comunicarse con nosotros, puede optar por buscar atencin mdica  en el consultorio de su doctor(a), en una clnica privada, en un centro de atencin urgente o en una sala de emergencias.  Si tiene una emergencia mdica, por favor llame inmediatamente al 911 o vaya a la sala de emergencias.  Nmeros de bper  - Dr. Kowalski: [phone]  - Dra. Moye: [phone]  - Dra. Stewart: [phone]  En caso de inclemencias del tiempo, por favor llame a nuestra lnea principal al [phone] para una actualizacin sobre el estado de cualquier retraso o cierre.  Consejos para la medicacin en dermatologa: Por favor, guarde las cajas en las que vienen los medicamentos de uso tpico para ayudarle a seguir las instrucciones sobre dnde y cmo usarlos. Las farmacias generalmente imprimen las instrucciones del medicamento slo en las cajas y no directamente en los tubos del medicamento.   Si su medicamento es muy caro, por favor, pngase en contacto con nuestra oficina llamando al [phone] y presione la opcin 4 o envenos un mensaje a travs de MyChart.   No podemos decirle cul ser su copago por los medicamentos por adelantado ya que esto es diferente dependiendo de la cobertura de su seguro. Sin embargo, es posible que podamos encontrar un medicamento sustituto a menor costo o llenar un formulario para que el  seguro cubra el medicamento que se considera necesario.   Si se requiere una autorizacin previa para que su compaa de seguros cubra su medicamento, por favor permtanos de 1 a 2 das hbiles para completar este proceso.  Los precios de los medicamentos varan con frecuencia dependiendo del lugar de dnde se surte la receta y alguna farmacias pueden ofrecer precios ms baratos.  El sitio web www.goodrx.com tiene cupones para medicamentos de diferentes farmacias. Los precios aqu no tienen en cuenta lo que podra costar con la ayuda del seguro (puede ser ms barato con su seguro), pero el sitio web puede darle el precio si no utiliz ningn seguro.  - Puede imprimir el cupn correspondiente y llevarlo con su receta a la farmacia.  - Tambin puede pasar por nuestra oficina durante el horario de atencin regular y recoger una tarjeta de cupones de GoodRx.  - Si necesita que su receta se enve electrnicamente a una farmacia diferente, informe a nuestra oficina a travs de MyChart de [facility]   o por telfono llamando al [phone] y presione la opcin 4.  

## 2022-06-28 ENCOUNTER — Encounter: Payer: Self-pay | Admitting: Dermatology

## 2022-07-15 ENCOUNTER — Other Ambulatory Visit: Payer: Self-pay | Admitting: Family Medicine

## 2022-07-15 DIAGNOSIS — K2100 Gastro-esophageal reflux disease with esophagitis, without bleeding: Secondary | ICD-10-CM

## 2022-07-30 ENCOUNTER — Other Ambulatory Visit: Payer: No Typology Code available for payment source

## 2022-07-30 ENCOUNTER — Telehealth: Payer: Self-pay | Admitting: Family Medicine

## 2022-07-30 NOTE — Telephone Encounter (Signed)
[place] to schedule their annual wellness visit. Appointment made for 08/06/2022.  [place] Direct Dial: [phone]

## 2022-08-05 ENCOUNTER — Encounter: Payer: No Typology Code available for payment source | Admitting: Family Medicine

## 2022-08-06 ENCOUNTER — Telehealth: Payer: Self-pay | Admitting: Family Medicine

## 2022-08-06 ENCOUNTER — Ambulatory Visit (INDEPENDENT_AMBULATORY_CARE_PROVIDER_SITE_OTHER): Payer: No Typology Code available for payment source

## 2022-08-06 VITALS — Ht 70.0 in | Wt 173.0 lb

## 2022-08-06 DIAGNOSIS — I10 Essential (primary) hypertension: Secondary | ICD-10-CM

## 2022-08-06 DIAGNOSIS — Z0000 Encounter for general adult medical examination without abnormal findings: Secondary | ICD-10-CM | POA: Diagnosis not present

## 2022-08-06 MED ORDER — LOSARTAN POTASSIUM 50 MG PO TABS: 50.0000 mg | ORAL_TABLET | Freq: Every day | ORAL | 4 refills | Status: AC

## 2022-08-06 NOTE — Progress Notes (Signed)
I connected with  Carolin Sicks on 08/06/22 by a audio enabled telemedicine application and verified that I am speaking with the correct person using two identifiers.  Patient Location: Home  Provider Location: Office/Clinic  I discussed the limitations of evaluation and management by telemedicine. The patient expressed understanding and agreed to proceed.  Subjective:   Jadenn Brookbank is a 72 y.o. male who presents for Medicare Annual/Subsequent preventive examination.  Review of Systems      Cardiac Risk Factors include: advanced age (>66men, >64 women);hypertension;diabetes mellitus;male gender;sedentary lifestyle     Objective:    Today's Vitals   08/06/22 1424  Weight: 173 lb (78.5 kg)  Height: 5\' 10"  (1.778 m)   Body mass index is 24.82 kg/m.     08/06/2022    2:35 PM 08/28/2021    6:59 AM 07/05/2017   11:52 PM  Advanced Directives  Does Patient Have a Medical Advance Directive? No No No  Would patient like information on creating a medical advance directive? No - Patient declined No - Patient declined No - Patient declined    Current Medications (verified) Outpatient Encounter Medications as of 08/06/2022  Medication Sig   atorvastatin (LIPITOR) 40 MG tablet Take 1 tablet (40 mg total) by mouth daily.   Chlorpheniramine Maleate (ALLERGY RELIEF PO) Take by mouth as needed. OTC   diphenhydramine-acetaminophen (TYLENOL PM) 25-500 MG TABS Take 1 tablet by mouth at bedtime.   ketoconazole (NIZORAL) 2 % shampoo apply three times per week, massage into scalp and leave in for 10 minutes before rinsing out   losartan (COZAAR) 50 MG tablet Take 1 tablet (50 mg total) by mouth daily.   metFORMIN (GLUCOPHAGE) 500 MG tablet Take 1 tablet (500 mg total) by mouth daily with breakfast.   mometasone (ELOCON) 0.1 % lotion Apply to affected itchy skin 3-5 days a week at bedtime prn itch   omeprazole (PRILOSEC) 20 MG capsule TAKE ONE CAPSULE BY MOUTH ONCE DAILY   Probiotic  Product (PROBIOTIC 10 ULTRA STRENGTH PO) Take by mouth.   meclizine (ANTIVERT) 25 MG tablet Take 1 tablet (25 mg total) by mouth 3 (three) times daily as needed for dizziness. (Patient not taking: Reported on 08/06/2022)   No facility-administered encounter medications on file as of 08/06/2022.    Allergies (verified) Ace inhibitors, Advil [ibuprofen], Aleve [naproxen], Carvedilol, Metoprolol, and Amlodipine   History: Past Medical History:  Diagnosis Date   Arthritis    Carotid stenosis    Cholecystitis, acute 07/06/2017   Colon polyp    Diabetes mellitus type 2, uncontrolled, without complications    ED (erectile dysfunction)    Hyperlipemia    Hypertension    Left fibular fracture 01/17/2015   Past Surgical History:  Procedure Laterality Date   CARDIAC CATHETERIZATION  2009   normal per pt   carotid US  03/2012   bilateral 40-59% stenosis, rec rpt 1 yr   CHOLECYSTECTOMY N/A 07/06/2017   path showed acute and chronic cholecystitis - Pabon, Diego F, MD   COLONOSCOPY     COLONOSCOPY WITH PROPOFOL N/A 08/28/2021   TAs, poor prep, hemorrhoids, rpt 6 mo (Vanga, Loel Dubonnet, MD)   ESOPHAGOGASTRODUODENOSCOPY N/A 08/28/2021   gasric polyps, small HH, biopsy with chronic gastritis (Vanga, Loel Dubonnet, MD)   TONSILLECTOMY AND ADENOIDECTOMY     Family History  Problem Relation Age of Onset   Rheum arthritis Father    Hyperlipidemia Father    Heart disease Father    Stroke Father  Diabetes Father    Arthritis Father    Coronary artery disease Father    Breast cancer Mother    Cancer Mother        breast   Heart disease Brother    Coronary artery disease Brother    Hyperlipidemia Brother    Diabetes Brother    Social History   Socioeconomic History   Marital status: Married    Spouse name: Not on file   Number of children: 0   Years of education: Not on file   Highest education level: Not on file  Occupational History   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  No children   Occupation: Naval architect at [employer]   Edu: GED   Activity: stays active, mowing grass, walking and lifting weights.   Diet: poor fruits and vegetables, good water intake.  Lots of red meat   Social Determinants of Health   Financial Resource Strain: Low Risk  (08/06/2022)   Overall Financial Resource Strain (CARDIA)    Difficulty of Paying Living Expenses: Not hard at all  Food Insecurity: No Food Insecurity (08/06/2022)   Hunger Vital Sign    Worried About Running Out of Food in the Last Year: Never true    Ran Out of Food in the Last Year: Never true  Transportation Needs: No Transportation Needs (08/06/2022)   PRAPARE - Administrator, Civil Service (Medical): No    Lack of Transportation (Non-Medical): No  Physical Activity: Insufficiently Active (08/06/2022)   Exercise Vital Sign    Days of Exercise per Week: 2 days    Minutes of Exercise per Session: 20 min  Stress: No Stress Concern Present (08/06/2022)   [employer] of Occupational Health - Occupational Stress Questionnaire    Feeling of Stress : Not at all  Social Connections: Moderately Isolated (08/06/2022)   Social Connection and Isolation Panel [NHANES]    Frequency of Communication with Friends and Family: More than three times a week    Frequency of Social Gatherings with Friends and Family: More than three times a week    Attends Religious Services: Never    Database administrator or Organizations: No    Attends Engineer, structural: Never    Marital Status: Married    Tobacco Counseling Ready to quit: Not  Answered Counseling given: Not Answered   Clinical Intake:  Pre-visit preparation completed: Yes  Pain : No/denies pain     Nutritional Risks: None Diabetes: Yes CBG done?: No Did pt. bring in CBG monitor from home?: No  How often do you need to have someone help you when you read instructions, pamphlets, or other written materials from your doctor or pharmacy?: 1 - Never  Diabetic?Nutrition Risk Assessment:  Has the patient had any N/V/D within the last 2 months?  No  Does the patient have any non-healing wounds?  No  Has the patient had any unintentional weight loss or weight gain?  No   Diabetes:  Is  the patient diabetic?  Yes  If diabetic, was a CBG obtained today?  No  Did the patient bring in their glucometer from home?  No  How often do you monitor your CBG's? Pt does not have a monitor.   Financial Strains and Diabetes Management:  Are you having any financial strains with the device, your supplies or your medication? No .  Does the patient want to be seen by Chronic Care Management for management of their diabetes?  No  Would the patient like to be referred to a Nutritionist or for Diabetic Management?  No   Diabetic Exams:  Diabetic Eye Exam: Completed 01/19/22 [facility]. Diabetic Foot Exam: Completed 04/01/22 PCP    Interpreter Needed?: No  Information entered by :: C.Abena Erdman LPN   Activities of Daily Living    08/06/2022    2:35 PM 07/30/2022   12:40 PM  In your present state of health, do you have any difficulty performing the following activities:  Hearing? 0 0  Vision? 0 0  Difficulty concentrating or making decisions? 0 0  Walking or climbing stairs? 0 0  Dressing or bathing? 0 0  Doing errands, shopping? 0 0  Preparing Food and eating ? N N  Using the Toilet? N N  In the past six months, have you accidently leaked urine? N N  Do you have problems with loss of bowel control? N N  Managing your Medications? N N  Managing your Finances? N N   Housekeeping or managing your Housekeeping? N N    Patient Care Team: Eustaquio BoydenGutierrez, Javier, MD as PCP - General (Family Medicine)  Indicate any recent Medical Services you may have received from other than [facility] providers in the past year (date may be approximate).     Assessment:   This is a routine wellness examination for Windy FastRonald.  Hearing/Vision screen Hearing Screening - Comments:: No aid Vision Screening - Comments:: Glasses - [facility]  Dietary issues and exercise activities discussed: Current Exercise Habits: Home exercise routine, Type of exercise: walking, Time (Minutes): 20, Frequency (Times/Week): 2, Weekly Exercise (Minutes/Week): 40, Intensity: Mild, Exercise limited by: None identified   Goals Addressed             This Visit's Progress    Patient Stated       Keep A1c down, drink more water, and maintain health.       Depression Screen    08/06/2022    2:34 PM 07/30/2021    3:24 PM 06/06/2021    2:41 PM 03/07/2021    1:54 PM 11/24/2019   11:22 AM 10/02/2018    8:49 AM 09/10/2017    3:10 PM  PHQ 2/9 Scores  PHQ - 2 Score 0 0 0 0 0 0 0    Fall Risk    08/06/2022    2:28 PM 07/30/2022   12:40 PM 07/30/2021    3:24 PM 06/06/2021    2:41 PM 03/07/2021    1:54 PM  Fall Risk   Falls in the past year? 0 0 0 0 0  Number falls in past yr: 0      Injury with Fall? 0 0     Risk for fall due to : No Fall Risks    No Fall Risks  Follow up Falls prevention discussed;Falls evaluation completed    Falls evaluation completed    FALL RISK PREVENTION PERTAINING TO THE HOME:  Any stairs in or around the home? Yes  If so,  are there any without handrails?  No rails Home free of loose throw rugs in walkways, pet beds, electrical cords, etc? Yes  Adequate lighting in your home to reduce risk of falls? Yes   ASSISTIVE DEVICES UTILIZED TO PREVENT FALLS:  Life alert? Yes  Use of a cane, walker or w/c? No  Grab bars in the bathroom? Yes  Shower chair or bench in shower? No   Elevated toilet seat or a handicapped toilet? No    Cognitive Function:    06/06/2021    2:51 PM  MMSE - Mini Mental State Exam  Orientation to time 5  Orientation to Place 5  Registration 3  Attention/ Calculation 5  Recall 3  Language- name 2 objects 2  Language- repeat 1  Language- follow 3 step command 3  Language- read & follow direction 1  Write a sentence 1  Copy design 1  Total score 30        08/06/2022    2:36 PM  6CIT Screen  What Year? 0 points  What month? 0 points  What time? 0 points  Count back from 20 0 points  Months in reverse 0 points  Repeat phrase 0 points  Total Score 0 points    Immunizations Immunization History  Administered Date(s) Administered   Fluad Quad(high Dose 65+) 01/13/2019   Influenza, High Dose Seasonal PF 01/06/2019, 01/12/2020   Influenza, Seasonal, Injecte, Preservative Fre 03/23/2012   Influenza,inj,Quad PF,6+ Mos 04/19/2013, 05/19/2014, 01/17/2015, 01/28/2017   Influenza-Unspecified 12/28/2017, 01/03/2021   Moderna SARS-COV2 Booster Vaccination 03/10/2020   Moderna Sars-Covid-2 Vaccination 06/26/2019, 07/24/2019   Pneumococcal Conjugate-13 10/02/2018   Pneumococcal Polysaccharide-23 09/24/2017   Td 04/29/2010   Tdap 04/30/2021   Zoster Recombinat (Shingrix) 04/30/2021, 06/28/2021    TDAP status: Up to date  Flu Vaccine status: Up to date  Pneumococcal vaccine status: Up to date  Covid-19 vaccine status: Declined, Education has been provided regarding the importance of this vaccine but patient still declined. Advised may receive this vaccine at local pharmacy or Health Dept.or vaccine clinic. Aware to provide a copy of the vaccination record if obtained from local pharmacy or Health Dept. Verbalized acceptance and understanding.  Qualifies for Shingles Vaccine? Yes   Zostavax completed No   Shingrix Completed?: Yes  Screening Tests Health Maintenance  Topic Date Due   Diabetic kidney evaluation - Urine ACR   08/13/2017   COVID-19 Vaccine (3 - Moderna risk series) 04/07/2020   Diabetic kidney evaluation - eGFR measurement  05/04/2022   COLON CANCER SCREENING ANNUAL FOBT  08/01/2022   COLONOSCOPY (Pts 45-16yrs Insurance coverage will need to be confirmed)  08/29/2022   HEMOGLOBIN A1C  10/01/2022   INFLUENZA VACCINE  11/28/2022   OPHTHALMOLOGY EXAM  01/23/2023   FOOT EXAM  04/02/2023   Medicare Annual Wellness (AWV)  08/06/2023   Pneumonia Vaccine 39+ Years old (3 of 3 - PPSV23 or PCV20) 10/02/2023   DTaP/Tdap/Td (3 - Td or Tdap) 05/01/2031   Hepatitis C Screening  Completed   Zoster Vaccines- Shingrix  Completed   HPV VACCINES  Aged Out    Health Maintenance  Health Maintenance Due  Topic Date Due   Diabetic kidney evaluation - Urine ACR  08/13/2017   COVID-19 Vaccine (3 - Moderna risk series) 04/07/2020   Diabetic kidney evaluation - eGFR measurement  05/04/2022   COLON CANCER SCREENING ANNUAL FOBT  08/01/2022    Colorectal cancer screening: Type of screening: Colonoscopy. Completed 08/28/21. Repeat every 1 years Pt has  appointment scheduled with GI.  Lung Cancer Screening: (Low Dose CT Chest recommended if Age 60-80 years, 30 pack-year currently smoking OR have quit w/in 15years.) does not qualify.   Lung Cancer Screening Referral: no  Additional Screening:  Hepatitis C Screening: does qualify; Completed 07/12/16  Vision Screening: Recommended annual ophthalmology exams for early detection of glaucoma and other disorders of the eye. Is the patient up to date with their annual eye exam?  Yes  Who is the provider or what is the name of the office in which the patient attends annual eye exams? [facility] If pt is not established with a provider, would they like to be referred to a provider to establish care? No .   Dental Screening: Recommended annual dental exams for proper oral hygiene  Community Resource Referral / Chronic Care Management: CRR required this visit?  No    CCM required this visit?  No      Plan:     I have personally reviewed and noted the following in the patient's chart:   Medical and social history Use of alcohol, tobacco or illicit drugs  Current medications and supplements including opioid prescriptions. Patient is not currently taking opioid prescriptions. Functional ability and status Nutritional status Physical activity Advanced directives List of other physicians Hospitalizations, surgeries, and ER visits in previous 12 months Vitals Screenings to include cognitive, depression, and falls Referrals and appointments  In addition, I have reviewed and discussed with patient certain preventive protocols, quality metrics, and best practice recommendations. A written personalized care plan for preventive services as well as general preventive health recommendations were provided to patient.     Maryan Puls, LPN   06/05/784   Nurse Notes: none

## 2022-08-06 NOTE — Patient Instructions (Signed)
Mark Davidson , Thank you for taking time to come for your Medicare Wellness Visit. I appreciate your ongoing commitment to your health goals. Please review the following plan we discussed and let me know if I can assist you in the future.   These are the goals we discussed:  Goals      Patient Stated     Keep A1c down, drink more water, and maintain health.        This is a list of the screening recommended for you and due dates:  Health Maintenance  Topic Date Due   Yearly kidney health urinalysis for diabetes  08/13/2017   COVID-19 Vaccine (3 - Moderna risk series) 04/07/2020   Yearly kidney function blood test for diabetes  05/04/2022   Stool Blood Test  08/01/2022   Colon Cancer Screening  08/29/2022   Hemoglobin A1C  10/01/2022   Flu Shot  11/28/2022   Eye exam for diabetics  01/23/2023   Complete foot exam   04/02/2023   Medicare Annual Wellness Visit  08/06/2023   Pneumonia Vaccine (3 of 3 - PPSV23 or PCV20) 10/02/2023   DTaP/Tdap/Td vaccine (3 - Td or Tdap) 05/01/2031   Hepatitis C Screening: USPSTF Recommendation to screen - Ages 86-79 yo.  Completed   Zoster (Shingles) Vaccine  Completed   HPV Vaccine  Aged Out    Advanced directives: Advance directive discussed with you today. Even though you declined this today, please call our office should you change your mind, and we can give you the proper paperwork for you to fill out.   Conditions/risks identified: Aim for 30 minutes of exercise or brisk walking, 6-8 glasses of water, and 5 servings of fruits and vegetables each day.   Next appointment: Follow up in one year for your annual wellness visit. 08/07/23 @ 2:30 televisit  Preventive Care 65 Years and Older, Male  Preventive care refers to lifestyle choices and visits with your health care provider that can promote health and wellness. What does preventive care include? A yearly physical exam. This is also called an annual well check. Dental exams once or twice a  year. Routine eye exams. Ask your health care provider how often you should have your eyes checked. Personal lifestyle choices, including: Daily care of your teeth and gums. Regular physical activity. Eating a healthy diet. Avoiding tobacco and drug use. Limiting alcohol use. Practicing safe sex. Taking low doses of aspirin every day. Taking vitamin and mineral supplements as recommended by your health care provider. What happens during an annual well check? The services and screenings done by your health care provider during your annual well check will depend on your age, overall health, lifestyle risk factors, and family history of disease. Counseling  Your health care provider may ask you questions about your: Alcohol use. Tobacco use. Drug use. Emotional well-being. Home and relationship well-being. Sexual activity. Eating habits. History of falls. Memory and ability to understand (cognition). Work and work Astronomer. Screening  You may have the following tests or measurements: Height, weight, and BMI. Blood pressure. Lipid and cholesterol levels. These may be checked every 5 years, or more frequently if you are over 34 years old. Skin check. Lung cancer screening. You may have this screening every year starting at age 68 if you have a 30-pack-year history of smoking and currently smoke or have quit within the past 15 years. Fecal occult blood test (FOBT) of the stool. You may have this test every year starting at age  50. Flexible sigmoidoscopy or colonoscopy. You may have a sigmoidoscopy every 5 years or a colonoscopy every 10 years starting at age 54. Prostate cancer screening. Recommendations will vary depending on your family history and other risks. Hepatitis C blood test. Hepatitis B blood test. Sexually transmitted disease (STD) testing. Diabetes screening. This is done by checking your blood sugar (glucose) after you have not eaten for a while (fasting). You may  have this done every 1-3 years. Abdominal aortic aneurysm (AAA) screening. You may need this if you are a current or former smoker. Osteoporosis. You may be screened starting at age 57 if you are at high risk. Talk with your health care provider about your test results, treatment options, and if necessary, the need for more tests. Vaccines  Your health care provider may recommend certain vaccines, such as: Influenza vaccine. This is recommended every year. Tetanus, diphtheria, and acellular pertussis (Tdap, Td) vaccine. You may need a Td booster every 10 years. Zoster vaccine. You may need this after age 52. Pneumococcal 13-valent conjugate (PCV13) vaccine. One dose is recommended after age 102. Pneumococcal polysaccharide (PPSV23) vaccine. One dose is recommended after age 27. Talk to your health care provider about which screenings and vaccines you need and how often you need them. This information is not intended to replace advice given to you by your health care provider. Make sure you discuss any questions you have with your health care provider. Document Released: 05/12/2015 Document Revised: 01/03/2016 Document Reviewed: 02/14/2015 Elsevier Interactive Patient Education  2017 [employer].  Fall Prevention in the Home Falls can cause injuries. They can happen to people of all ages. There are many things you can do to make your home safe and to help prevent falls. What can I do on the outside of my home? Regularly fix the edges of walkways and driveways and fix any cracks. Remove anything that might make you trip as you walk through a door, such as a raised step or threshold. Trim any bushes or trees on the path to your home. Use bright outdoor lighting. Clear any walking paths of anything that might make someone trip, such as rocks or tools. Regularly check to see if handrails are loose or broken. Make sure that both sides of any steps have handrails. Any raised decks and porches  should have guardrails on the edges. Have any leaves, snow, or ice cleared regularly. Use sand or salt on walking paths during winter. Clean up any spills in your garage right away. This includes oil or grease spills. What can I do in the bathroom? Use night lights. Install grab bars by the toilet and in the tub and shower. Do not use towel bars as grab bars. Use non-skid mats or decals in the tub or shower. If you need to sit down in the shower, use a plastic, non-slip stool. Keep the floor dry. Clean up any water that spills on the floor as soon as it happens. Remove soap buildup in the tub or shower regularly. Attach bath mats securely with double-sided non-slip rug tape. Do not have throw rugs and other things on the floor that can make you trip. What can I do in the bedroom? Use night lights. Make sure that you have a light by your bed that is easy to reach. Do not use any sheets or blankets that are too big for your bed. They should not hang down onto the floor. Have a firm chair that has side arms. You can use  this for support while you get dressed. Do not have throw rugs and other things on the floor that can make you trip. What can I do in the kitchen? Clean up any spills right away. Avoid walking on wet floors. Keep items that you use a lot in easy-to-reach places. If you need to reach something above you, use a strong step stool that has a grab bar. Keep electrical cords out of the way. Do not use floor polish or wax that makes floors slippery. If you must use wax, use non-skid floor wax. Do not have throw rugs and other things on the floor that can make you trip. What can I do with my stairs? Do not leave any items on the stairs. Make sure that there are handrails on both sides of the stairs and use them. Fix handrails that are broken or loose. Make sure that handrails are as long as the stairways. Check any carpeting to make sure that it is firmly attached to the stairs.  Fix any carpet that is loose or worn. Avoid having throw rugs at the top or bottom of the stairs. If you do have throw rugs, attach them to the floor with carpet tape. Make sure that you have a light switch at the top of the stairs and the bottom of the stairs. If you do not have them, ask someone to add them for you. What else can I do to help prevent falls? Wear shoes that: Do not have high heels. Have rubber bottoms. Are comfortable and fit you well. Are closed at the toe. Do not wear sandals. If you use a stepladder: Make sure that it is fully opened. Do not climb a closed stepladder. Make sure that both sides of the stepladder are locked into place. Ask someone to hold it for you, if possible. Clearly mark and make sure that you can see: Any grab bars or handrails. First and last steps. Where the edge of each step is. Use tools that help you move around (mobility aids) if they are needed. These include: Canes. Walkers. Scooters. Crutches. Turn on the lights when you go into a dark area. Replace any light bulbs as soon as they burn out. Set up your furniture so you have a clear path. Avoid moving your furniture around. If any of your floors are uneven, fix them. If there are any pets around you, be aware of where they are. Review your medicines with your doctor. Some medicines can make you feel dizzy. This can increase your chance of falling. Ask your doctor what other things that you can do to help prevent falls. This information is not intended to replace advice given to you by your health care provider. Make sure you discuss any questions you have with your health care provider. Document Released: 02/09/2009 Document Revised: 09/21/2015 Document Reviewed: 05/20/2014 Elsevier Interactive Patient Education  2017 [employer].

## 2022-08-06 NOTE — Telephone Encounter (Signed)
Received message from [facility] adherence team that pt needs losartan refilled. I've refilled losartan.

## 2022-08-10 ENCOUNTER — Other Ambulatory Visit: Payer: Self-pay | Admitting: Family Medicine

## 2022-08-10 DIAGNOSIS — E1169 Type 2 diabetes mellitus with other specified complication: Secondary | ICD-10-CM

## 2022-08-10 DIAGNOSIS — Z125 Encounter for screening for malignant neoplasm of prostate: Secondary | ICD-10-CM

## 2022-08-13 ENCOUNTER — Other Ambulatory Visit: Payer: Self-pay | Admitting: Family Medicine

## 2022-08-13 DIAGNOSIS — K2100 Gastro-esophageal reflux disease with esophagitis, without bleeding: Secondary | ICD-10-CM

## 2022-08-14 NOTE — Telephone Encounter (Signed)
Too soon. Rx sent on 07/16/22, #90/0 to [facility].  Request denied.

## 2022-08-16 ENCOUNTER — Other Ambulatory Visit: Payer: No Typology Code available for payment source

## 2022-08-19 ENCOUNTER — Other Ambulatory Visit (INDEPENDENT_AMBULATORY_CARE_PROVIDER_SITE_OTHER): Payer: No Typology Code available for payment source

## 2022-08-19 DIAGNOSIS — E1169 Type 2 diabetes mellitus with other specified complication: Secondary | ICD-10-CM

## 2022-08-19 DIAGNOSIS — E785 Hyperlipidemia, unspecified: Secondary | ICD-10-CM | POA: Diagnosis not present

## 2022-08-19 DIAGNOSIS — Z125 Encounter for screening for malignant neoplasm of prostate: Secondary | ICD-10-CM

## 2022-08-19 LAB — COMPREHENSIVE METABOLIC PANEL
ALT: 31 U/L (ref 0–53)
AST: 18 U/L (ref 0–37)
Albumin: 4.3 g/dL (ref 3.5–5.2)
Alkaline Phosphatase: 84 U/L (ref 39–117)
BUN: 24 mg/dL — ABNORMAL HIGH (ref 6–23)
CO2: 26 mEq/L (ref 19–32)
Calcium: 9.7 mg/dL (ref 8.4–10.5)
Chloride: 103 mEq/L (ref 96–112)
Creatinine, Ser: 1.08 mg/dL (ref 0.40–1.50)
GFR: 69.96 mL/min (ref >60.00)
Glucose, Bld: 130 mg/dL — ABNORMAL HIGH (ref 70–99)
Potassium: 4.3 mEq/L (ref 3.5–5.1)
Sodium: 138 mEq/L (ref 135–145)
Total Bilirubin: 0.7 mg/dL (ref 0.2–1.2)
Total Protein: 6.7 g/dL (ref 6.0–8.3)

## 2022-08-19 LAB — LIPID PANEL
Cholesterol: 146 mg/dL (ref 0–200)
HDL: 44.7 mg/dL (ref >39.00)
LDL Cholesterol: 75 mg/dL (ref 0–99)
NonHDL: 100.94
Total CHOL/HDL Ratio: 3
Triglycerides: 132 mg/dL (ref 0.0–149.0)
VLDL: 26.4 mg/dL (ref 0.0–40.0)

## 2022-08-19 LAB — PSA: PSA: 0.94 ng/mL (ref 0.10–4.00)

## 2022-08-19 LAB — HEMOGLOBIN A1C: Hgb A1c MFr Bld: 6.8 % — ABNORMAL HIGH (ref 4.6–6.5)

## 2022-08-19 LAB — MICROALBUMIN / CREATININE URINE RATIO
Creatinine,U: 90.4 mg/dL
Microalb Creat Ratio: 0.8 mg/g (ref 0.0–30.0)
Microalb, Ur: <0.7 mg/dL (ref 0.0–1.9)

## 2022-08-23 ENCOUNTER — Encounter: Payer: No Typology Code available for payment source | Admitting: Family Medicine

## 2022-08-26 ENCOUNTER — Telehealth: Payer: Self-pay

## 2022-08-26 NOTE — Telephone Encounter (Addendum)
Lvm asking pt to call back. Need to relay Dr. Timoteo Expose message (see Labs, Result Notes- 08/19/22.) Dr. Reece Agar also sent his message to pt via MyChart.    Dr. Timoteo Expose msg: Physical labs done but physical was cancelled. Plz schedule CPE to review labs.

## 2022-08-27 NOTE — Telephone Encounter (Signed)
That's ok  Thanks

## 2022-08-27 NOTE — Telephone Encounter (Signed)
Noted  

## 2022-08-27 NOTE — Telephone Encounter (Signed)
Patient has app on 7/10 is that ok or does he need to be seen sooner

## 2022-09-05 ENCOUNTER — Other Ambulatory Visit: Payer: Self-pay | Admitting: Family Medicine

## 2022-09-05 DIAGNOSIS — E1165 Type 2 diabetes mellitus with hyperglycemia: Secondary | ICD-10-CM

## 2022-09-09 ENCOUNTER — Encounter: Payer: Self-pay | Admitting: Gastroenterology

## 2022-09-09 ENCOUNTER — Ambulatory Visit (INDEPENDENT_AMBULATORY_CARE_PROVIDER_SITE_OTHER): Payer: No Typology Code available for payment source | Admitting: Gastroenterology

## 2022-09-09 VITALS — BP 136/74 | HR 73 | Temp 97.8°F | Ht 70.0 in | Wt 174.5 lb

## 2022-09-09 DIAGNOSIS — K580 Irritable bowel syndrome with diarrhea: Secondary | ICD-10-CM

## 2022-09-09 NOTE — Progress Notes (Signed)
Arlyss Repress, MD [address]  [address]  [place], [place] [address]  Main: [phone]  Fax: [phone]    Gastroenterology Consultation  Referring Provider:     Eustaquio Boyden, MD Primary Care Physician:  Eustaquio Boyden, MD Primary Gastroenterologist:  Dr. Arlyss Repress Reason for Consultation:     Loose stools, lower abdominal discomfort, chronic GERD        HPI:   Mark Davidson is a 72 y.o. male referred by Eustaquio Boyden, MD  for consultation & management of loose stools, lower abdominal discomfort and chronic GERD.  Patient has history of diabetes has been on metformin, history of chronic GERD, has been on low-dose omeprazole 20 mg daily for several years is referred to GI for evaluation of loose stools and lower abdominal discomfort.  Patient reports that he started noticing loose, mushy, nonbloody bowel movements few months ago, in August he underwent stool studies to rule out infection which was negative including C. difficile.  Patient had noticed worsening of increased bowel frequency about 2 to 3 weeks ago after the metformin dose has been increased because of elevated hemoglobin A1c.  Therefore metformin has been discontinued and referred to GI for further evaluation.  Patient denies any weight loss, abdominal bloating.  He feels very uncomfortable throughout the lower abdomen, denies any aggravating or relieving factors.  Denies any loss of appetite, early satiety, nausea or vomiting.  No evidence of anemia.  Most recent labs revealed mildly elevated ALT.  He is status postcholecystectomy in 2019.  Patient consumes tobacco sublingual for several years.  He used to smoke in the past  Follow-up visit 09/09/2022 Mr. Flocco is here for follow-up of lower abdominal discomfort, loose stools and bloating.  He underwent upper endoscopy and colonoscopy which were unremarkable.  Pancreatic fecal elastase levels were normal.  GI profile PCR was negative.  Alpha-gal  panel, food allergy profile were normal.  He underwent CT abdomen with contrast which was unremarkable.  He is status postcholecystectomy.  He started taking over-the-counter probiotics for last 1 month and he notices significant improvement in his symptoms.  He had 1 mild flareup only.  His weight has been stable.  NSAIDs: None  Antiplts/Anticoagulants/Anti thrombotics: None  GI Procedures:  EGD and colonoscopy 08/28/2021 Small hiatal hernia, gastric fundic gland polyps Normal duodenum, biopsy Normal GE junction and esophagus  Multiple polyps in the colon Poor prep  DIAGNOSIS: A.  DUODENUM; COLD BIOPSY: - MILD REACTIVE DUODENOPATHY. - NEGATIVE FOR FEATURES OF CELIAC, DYSPLASIA, AND MALIGNANCY.  B STOMACH, RANDOM; COLD BIOPSY: - ANTRAL MUCOSA WITH REACTIVE AND FOCAL MILD CHRONIC ACTIVE GASTRITIS - OXYNTIC MUCOSA WITH CHANGES CONSISTENT WITH PROTON PUMP INHIBITOR EFFECT. - IHC FOR H. PYLORI WILL BE REPORTED IN AN ADDENDUM. - NEGATIVE FOR INTESTINAL METAPLASIA, DYSPLASIA, AND MALIGNANCY.  C.  COLON POLYP, CECUM; COLD BIOPSY: - TUBULAR ADENOMA. - NEGATIVE FOR HIGH-GRADE DYSPLASIA AND MALIGNANCY.  D.  COLON, RANDOM; COLD BIOPSY: - UNREMARKABLE COLONIC MUCOSA. - NEGATIVE FOR MICROSCOPIC COLITIS, DYSPLASIA, AND MALIGNANCY.  E.  COLON POLYP, DESCENDING; COLD SNARE: - TUBULAR ADENOMA. - NEGATIVE FOR HIGH-GRADE DYSPLASIA AND MALIGNANCY.   Past Medical History:  Diagnosis Date   Arthritis    Carotid stenosis    Cholecystitis, acute 07/06/2017   Colon polyp    Diabetes mellitus type 2, uncontrolled, without complications    ED (erectile dysfunction)    Hyperlipemia    Hypertension    Left fibular fracture 01/17/2015    Past Surgical  History:  Procedure Laterality Date   CARDIAC CATHETERIZATION  2009   normal per pt   carotid US  03/2012   bilateral 40-59% stenosis, rec rpt 1 yr   CHOLECYSTECTOMY N/A 07/06/2017   path showed acute and chronic cholecystitis - Pabon, Diego F,  MD   COLONOSCOPY     COLONOSCOPY WITH PROPOFOL N/A 08/28/2021   TAs, poor prep, hemorrhoids, rpt 6 mo (Leotha Westermeyer, Loel Dubonnet, MD)   ESOPHAGOGASTRODUODENOSCOPY N/A 08/28/2021   gasric polyps, small HH, biopsy with chronic gastritis (Maddux First, Loel Dubonnet, MD)   TONSILLECTOMY AND ADENOIDECTOMY      Current Outpatient Medications:    atorvastatin (LIPITOR) 40 MG tablet, Take 1 tablet (40 mg total) by mouth daily., Disp: 90 tablet, Rfl: 3   Chlorpheniramine Maleate (ALLERGY RELIEF PO), Take by mouth as needed. OTC, Disp: , Rfl:    diphenhydramine-acetaminophen (TYLENOL PM) 25-500 MG TABS, Take 1 tablet by mouth at bedtime., Disp: , Rfl:    ketoconazole (NIZORAL) 2 % shampoo, apply three times per week, massage into scalp and leave in for 10 minutes before rinsing out, Disp: 120 mL, Rfl: 6   losartan (COZAAR) 50 MG tablet, Take 1 tablet (50 mg total) by mouth daily., Disp: 90 tablet, Rfl: 4   metFORMIN (GLUCOPHAGE) 500 MG tablet, TAKE ONE TABLET BY MOUTH EVERY MORNING WITH BREAKFAST, Disp: 90 tablet, Rfl: 0   omeprazole (PRILOSEC) 20 MG capsule, TAKE ONE CAPSULE BY MOUTH ONCE DAILY, Disp: 90 capsule, Rfl: 0   Probiotic Product (PROBIOTIC 10 ULTRA STRENGTH PO), Take by mouth., Disp: , Rfl:   Family History  Problem Relation Age of Onset   Rheum arthritis Father    Hyperlipidemia Father    Heart disease Father    Stroke Father    Diabetes Father    Arthritis Father    Coronary artery disease Father    Breast cancer Mother    Cancer Mother        breast   Heart disease Brother    Coronary artery disease Brother    Hyperlipidemia Brother    Diabetes Brother      Social History   Tobacco Use   Smoking status: Never   Smokeless tobacco: Current    Types: Snuff  Vaping Use   Vaping Use: Never used  Substance Use Topics   Alcohol use: No   Drug use: No    Allergies as of 09/09/2022 - Review Complete 08/06/2022  Allergen Reaction Noted   Ace inhibitors Cough 08/27/2011   Advil  [ibuprofen] Diarrhea 07/30/2021   Aleve [naproxen] Diarrhea 07/30/2021   Carvedilol Other (See Comments) 08/27/2019   Metoprolol Nausea Only 06/06/2021   Amlodipine Other (See Comments) 03/04/2019    Review of Systems:    All systems reviewed and negative except where noted in HPI.   Physical Exam:  BP 136/74 (BP Location: Right Arm, Patient Position: Sitting, Cuff Size: Normal)   Pulse 73   Temp 97.8 F (36.6 C) (Oral)   Ht 5\' 10"  (1.778 m)   Wt 174 lb 8 oz (79.2 kg)   BMI 25.04 kg/m  No LMP for male patient.  General:   Alert,  Well-developed, well-nourished, pleasant and cooperative in NAD Head:  Normocephalic and atraumatic. Eyes:  Sclera clear, no icterus.   Conjunctiva pink. Ears:  Normal auditory acuity. Nose:  No deformity, discharge, or lesions. Mouth:  No deformity or lesions,oropharynx pink & moist. Neck:  Supple; no masses or thyromegaly. Lungs:  Respirations even and unlabored.  Clear throughout to auscultation.   No wheezes, crackles, or rhonchi. No acute distress. Heart:  Regular rate and rhythm; no murmurs, clicks, rubs, or gallops. Abdomen:  Normal bowel sounds. Soft, non-tender and non-distended without masses, hepatosplenomegaly or hernias noted.  No guarding or rebound tenderness.   Rectal: Not performed Msk:  Symmetrical without gross deformities. Good, equal movement & strength bilaterally. Pulses:  Normal pulses noted. Extremities:  No clubbing or edema.  No cyanosis. Neurologic:  Alert and oriented x3;  grossly normal neurologically. Skin:  Intact without significant lesions or rashes. No jaundice. Psych:  Alert and cooperative. Normal mood and affect.  Imaging Studies: Reviewed  Assessment and Plan:   Jaxstyn Stetter is a 72 y.o. male with history of diabetes, status postcholecystectomy secondary to acalculus cholecystitis in 2019, hypertension, hyperlipidemia is seen in consultation for chronic symptoms of increased bowel frequency, lower  abdominal discomfort and chronic GERD  Chronic GERD EGD revealed small hiatal hernia Continue omeprazole 20 mg daily before breakfast Continue antireflux lifestyle  Increased bowel frequency and lower abdominal discomfort Stool studies negative for infection pancreatic fecal elastase levels were normal colonoscopy with TI evaluation and random colon biopsies were unremarkable Alpha gal panel, food allergy profile negative EGD with gastric and duodenal biopsies were negative Probiotics have currently resolved his symptoms, continue the same  Tubular adenomas of the colon, suboptimal prep in 2023 Recommend repeat colonoscopy with 2-day prep Patient is waiting to clear his bills for the CT scan and likely will schedule colonoscopy later part of 2024   Follow up as needed   Arlyss Repress, MD

## 2022-09-18 ENCOUNTER — Encounter: Payer: Self-pay | Admitting: Dermatology

## 2022-09-18 ENCOUNTER — Ambulatory Visit: Payer: No Typology Code available for payment source | Admitting: Dermatology

## 2022-09-18 VITALS — BP 115/62 | HR 66

## 2022-09-18 DIAGNOSIS — L219 Seborrheic dermatitis, unspecified: Secondary | ICD-10-CM

## 2022-09-18 DIAGNOSIS — L578 Other skin changes due to chronic exposure to nonionizing radiation: Secondary | ICD-10-CM

## 2022-09-18 DIAGNOSIS — W908XXA Exposure to other nonionizing radiation, initial encounter: Secondary | ICD-10-CM | POA: Diagnosis not present

## 2022-09-18 DIAGNOSIS — L298 Other pruritus: Secondary | ICD-10-CM

## 2022-09-18 DIAGNOSIS — Z79899 Other long term (current) drug therapy: Secondary | ICD-10-CM

## 2022-09-18 DIAGNOSIS — X32XXXA Exposure to sunlight, initial encounter: Secondary | ICD-10-CM | POA: Diagnosis not present

## 2022-09-18 DIAGNOSIS — Z872 Personal history of diseases of the skin and subcutaneous tissue: Secondary | ICD-10-CM | POA: Diagnosis not present

## 2022-09-18 MED ORDER — KETOCONAZOLE 2 % EX SHAM
MEDICATED_SHAMPOO | CUTANEOUS | 11 refills | Status: DC
Start: 2022-09-18 — End: 2022-12-10

## 2022-09-18 NOTE — Patient Instructions (Signed)
Okay to stop Mometasone lotion, use only for worse flares.  Continue Ketoconazole 2% cream apply three times per week, massage into scalp and leave in for 10 minutes before rinsing out   Topical steroids (such as triamcinolone, fluocinolone, fluocinonide, mometasone, clobetasol, halobetasol, betamethasone, hydrocortisone) can cause thinning and lightening of the skin if they are used for too long in the same area. Your physician has selected the right strength medicine for your problem and area affected on the body. Please use your medication only as directed by your physician to prevent side effects.    Due to recent changes in healthcare laws, you may see results of your pathology and/or laboratory studies on MyChart before the doctors have had a chance to review them. We understand that in some cases there may be results that are confusing or concerning to you. Please understand that not all results are received at the same time and often the doctors may need to interpret multiple results in order to provide you with the best plan of care or course of treatment. Therefore, we ask that you please give [place] 2 business days to thoroughly review all your results before contacting the office for clarification. Should we see a critical lab result, you will be contacted sooner.   If You Need Anything After Your Visit  If you have any questions or concerns for your doctor, please call our main line at [phone] and press option 4 to reach your doctor's medical assistant. If no one answers, please leave a voicemail as directed and we will return your call as soon as possible. Messages left after 4 pm will be answered the following business day.   You may also send [place] a message via MyChart. We typically respond to MyChart messages within 1-2 business days.  For prescription refills, please ask your pharmacy to contact our office. Our fax number is [phone].  If you have an urgent issue when the  clinic is closed that cannot wait until the next business day, you can page your doctor at the number below.    Please note that while we do our best to be available for urgent issues outside of office hours, we are not available 24/7.   If you have an urgent issue and are unable to reach [place], you may choose to seek medical care at your doctor's office, retail clinic, urgent care center, or emergency room.  If you have a medical emergency, please immediately call 911 or go to the emergency department.  Pager Numbers  - Dr. Gwen Pounds: [phone]  - Dr. Neale Burly: [phone]  - Dr. Roseanne Reno: [phone]  In the event of inclement weather, please call our main line at [phone] for an update on the status of any delays or closures.  Dermatology Medication Tips: Please keep the boxes that topical medications come in in order to help keep track of the instructions about where and how to use these. Pharmacies typically print the medication instructions only on the boxes and not directly on the medication tubes.   If your medication is too expensive, please contact our office at [phone] option 4 or send [place] a message through MyChart.   We are unable to tell what your co-pay for medications will be in advance as this is different depending on your insurance coverage. However, we may be able to find a substitute medication at lower cost or fill out paperwork to get insurance to cover a needed medication.   If a prior authorization is required  to get your medication covered by your insurance company, please allow [place] 1-2 business days to complete this process.  Drug prices often vary depending on where the prescription is filled and some pharmacies may offer cheaper prices.  The website www.goodrx.com contains coupons for medications through different pharmacies. The prices here do not account for what the cost may be with help from insurance (it may be cheaper with your insurance), but the  website can give you the price if you did not use any insurance.  - You can print the associated coupon and take it with your prescription to the pharmacy.  - You may also stop by our office during regular business hours and pick up a GoodRx coupon card.  - If you need your prescription sent electronically to a different pharmacy, notify our office through [facility] or by phone at [phone] option 4.     Si Usted Necesita Algo Despus de Su Visita  Tambin puede enviarnos un mensaje a travs de Clinical cytogeneticist. Por lo general respondemos a los mensajes de MyChart en el transcurso de 1 a 2 das hbiles.  Para renovar recetas, por favor pida a su farmacia que se ponga en contacto con nuestra oficina. Annie Sable de fax es [place] [phone].  Si tiene un asunto urgente cuando la clnica est cerrada y que no puede esperar hasta el siguiente da hbil, puede llamar/localizar a su doctor(a) al nmero que aparece a continuacin.   Por favor, tenga en cuenta que aunque hacemos todo lo posible para estar disponibles para asuntos urgentes fuera del horario de [place], no estamos disponibles las 24 horas del da, los 7 [address]  [address] de la [place].   Si tiene un problema urgente y no puede comunicarse con nosotros, puede optar por buscar atencin mdica  en el consultorio de su doctor(a), en una clnica privada, en un centro de atencin urgente o en una sala de emergencias.  Si tiene Engineer, drilling, por favor llame inmediatamente al 911 o vaya a la sala de emergencias.  Nmeros de bper  - Dr. Gwen Pounds: [phone]  - Dra. Moye: [phone]  - Dra. Roseanne Reno: [phone]  En caso de inclemencias del [place], por favor llame a Lacy Duverney principal al [phone] para una actualizacin sobre el [place] de cualquier retraso o cierre.  Consejos para la medicacin en dermatologa: Por favor, guarde las cajas en las que vienen los medicamentos de uso tpico para ayudarle a seguir las  instrucciones sobre dnde y cmo usarlos. Las farmacias generalmente imprimen las instrucciones del medicamento slo en las cajas y no directamente en los tubos del [place].   Si su medicamento es muy caro, por favor, pngase en contacto con Rolm Gala llamando al [phone] y presione la opcin 4 o envenos un mensaje a travs de Clinical cytogeneticist.   No podemos decirle cul ser su copago por los medicamentos por adelantado ya que esto es diferente dependiendo de la cobertura de su seguro. Sin embargo, es posible que podamos encontrar un medicamento sustituto a Audiological scientist un formulario para que el seguro cubra el medicamento que se considera necesario.   Si se requiere una autorizacin previa para que su compaa de seguros [place] su medicamento, por favor permtanos de 1 a 2 das hbiles para completar [address]  Los precios de los medicamentos varan con frecuencia dependiendo del Environmental consultant de dnde se surte la receta y alguna farmacias pueden ofrecer precios ms baratos.  El sitio web www.goodrx.com tiene cupones para medicamentos de [facility]  farmacias. Los precios aqu no tienen en cuenta lo que podra costar con la ayuda del seguro (puede ser ms barato con su seguro), pero el sitio web puede darle el precio si no utiliz Tourist information centre manager.  - Puede imprimir el cupn correspondiente y llevarlo con su receta a la farmacia.  - Tambin puede pasar por nuestra oficina durante el horario de atencin regular y Education officer, museum una tarjeta de cupones de GoodRx.  - Si necesita que su receta se enve electrnicamente a una farmacia diferente, informe a nuestra oficina a travs de MyChart de [place] o por telfono llamando al [phone] y presione la opcin 4.

## 2022-09-18 NOTE — Progress Notes (Signed)
   Follow-Up Visit   Subjective  Mark Davidson is a 72 y.o. male who presents for the following: 3 month seborrheic dermatitis recheck. Doing well with ketoconazole shampoo. Feels like Mometasone causes GI issues and headaches when he uses it. States condition is a little better.   The following portions of the chart were reviewed this encounter and updated as appropriate:   Tobacco  Allergies  Meds  Problems  Med Hx  Surg Hx  Fam Hx     Review of Systems:  No other skin or systemic complaints except as noted in HPI or Assessment and Plan.  Objective  Well appearing patient in no apparent distress; mood and affect are within normal limits.  A focused examination was performed including face,neck,scalp. Relevant physical exam findings are noted in the Assessment and Plan.   Assessment & Plan  Seborrheic dermatitis with pruritis scalp Mild scale Chronic condition with duration or expected duration over one year. Currently well-controlled. - Seborrheic dermatitis is a chronic persistent rash characterized by pinkness and scaling most commonly of the mid face but also can occur on the scalp (dandruff), ears; mid chest, mid back and groin.  It tends to be exacerbated by stress and cooler weather.  People who have neurologic disease may experience new onset or exacerbation of existing seborrheic dermatitis.  The condition is not curable but treatable and can be controlled.   Okay to stop Mometasone lotion, use only for worse flares.  Continue Ketoconazole 2% cream apply three times per week, massage into scalp and leave in for 10 minutes before rinsing out   Topical steroids (such as triamcinolone, fluocinolone, fluocinonide, mometasone, clobetasol, halobetasol, betamethasone, hydrocortisone) can cause thinning and lightening of the skin if they are used for too long in the same area. Your physician has selected the right strength medicine for your problem and area affected on the  body. Please use your medication only as directed by your physician to prevent side effects.   Actinic Damage - Chronic condition, secondary to cumulative UV/sun exposure - diffuse scaly erythematous macules with underlying dyspigmentation - Recommend daily broad spectrum sunscreen SPF 30+ to sun-exposed areas, reapply every 2 hours as needed.  - Staying in the shade or wearing long sleeves, sun glasses (UVA+UVB protection) and wide brim hats (4-inch brim around the entire circumference of the hat) are also recommended for sun protection.  - Call for new or changing lesions.  Hx of AK Clear at vertex scalp today. Watch for recurrence   Return in 1 year (on 09/18/2023) for AK Follow Up.  I, Lawson Radar, CMA, am acting as scribe for Armida Sans, MD.  Documentation: I have reviewed the above documentation for accuracy and completeness, and I agree with the above.  Armida Sans, MD

## 2022-09-20 ENCOUNTER — Other Ambulatory Visit: Payer: Self-pay | Admitting: Family Medicine

## 2022-09-20 DIAGNOSIS — K2100 Gastro-esophageal reflux disease with esophagitis, without bleeding: Secondary | ICD-10-CM

## 2022-09-25 ENCOUNTER — Ambulatory Visit: Payer: No Typology Code available for payment source | Admitting: Gastroenterology

## 2022-09-29 ENCOUNTER — Encounter: Payer: Self-pay | Admitting: Dermatology

## 2022-10-14 ENCOUNTER — Other Ambulatory Visit: Payer: Self-pay | Admitting: Family Medicine

## 2022-10-14 DIAGNOSIS — E785 Hyperlipidemia, unspecified: Secondary | ICD-10-CM

## 2022-10-30 ENCOUNTER — Other Ambulatory Visit: Payer: No Typology Code available for payment source

## 2022-11-04 DIAGNOSIS — H2513 Age-related nuclear cataract, bilateral: Secondary | ICD-10-CM | POA: Diagnosis not present

## 2022-11-04 DIAGNOSIS — H1013 Acute atopic conjunctivitis, bilateral: Secondary | ICD-10-CM | POA: Diagnosis not present

## 2022-11-04 LAB — HM DIABETES EYE EXAM

## 2022-11-06 ENCOUNTER — Ambulatory Visit: Payer: No Typology Code available for payment source | Admitting: Family Medicine

## 2022-11-06 ENCOUNTER — Encounter: Payer: Self-pay | Admitting: Family Medicine

## 2022-11-06 VITALS — BP 156/66 | HR 70 | Temp 97.8°F | Ht 68.5 in | Wt 168.5 lb

## 2022-11-06 DIAGNOSIS — I6523 Occlusion and stenosis of bilateral carotid arteries: Secondary | ICD-10-CM

## 2022-11-06 DIAGNOSIS — E1169 Type 2 diabetes mellitus with other specified complication: Secondary | ICD-10-CM | POA: Diagnosis not present

## 2022-11-06 DIAGNOSIS — Z720 Tobacco use: Secondary | ICD-10-CM | POA: Diagnosis not present

## 2022-11-06 DIAGNOSIS — I10 Essential (primary) hypertension: Secondary | ICD-10-CM | POA: Diagnosis not present

## 2022-11-06 DIAGNOSIS — Z0000 Encounter for general adult medical examination without abnormal findings: Secondary | ICD-10-CM

## 2022-11-06 DIAGNOSIS — K529 Noninfective gastroenteritis and colitis, unspecified: Secondary | ICD-10-CM | POA: Diagnosis not present

## 2022-11-06 DIAGNOSIS — Z0001 Encounter for general adult medical examination with abnormal findings: Secondary | ICD-10-CM

## 2022-11-06 DIAGNOSIS — Z7189 Other specified counseling: Secondary | ICD-10-CM

## 2022-11-06 DIAGNOSIS — Z7984 Long term (current) use of oral hypoglycemic drugs: Secondary | ICD-10-CM

## 2022-11-06 DIAGNOSIS — E785 Hyperlipidemia, unspecified: Secondary | ICD-10-CM | POA: Diagnosis not present

## 2022-11-06 DIAGNOSIS — K2100 Gastro-esophageal reflux disease with esophagitis, without bleeding: Secondary | ICD-10-CM | POA: Diagnosis not present

## 2022-11-06 MED ORDER — OMEPRAZOLE 20 MG PO CPDR: 20.0000 mg | DELAYED_RELEASE_CAPSULE | Freq: Every day | ORAL | 4 refills | Status: DC

## 2022-11-06 MED ORDER — ATORVASTATIN CALCIUM 40 MG PO TABS
40.0000 mg | ORAL_TABLET | Freq: Every day | ORAL | 4 refills | Status: AC
Start: 2022-11-06 — End: ?

## 2022-11-06 MED ORDER — PROBIOTIC 10 ULTRA STRENGTH PO CAPS: 1.0000 | ORAL_CAPSULE | Freq: Every day | ORAL | Status: AC

## 2022-11-06 MED ORDER — METFORMIN HCL 500 MG PO TABS: ORAL_TABLET | ORAL | 4 refills | Status: DC

## 2022-11-06 MED ORDER — FLUTICASONE PROPIONATE 50 MCG/ACT NA SUSP: 2.0000 | Freq: Every day | NASAL | 11 refills | Status: DC

## 2022-11-06 NOTE — Assessment & Plan Note (Addendum)
Anticipate component of bile salt diarrhea as he notes diarrhea worse with greasy foods, s/p cholecystectomy 2019.  Discussed possible bile acid sequestrant trial - he will consider.

## 2022-11-06 NOTE — Assessment & Plan Note (Addendum)
Chronic, stable on metformin - continue.  RTC 3 mo HTN/DM f/u visit

## 2022-11-06 NOTE — Assessment & Plan Note (Signed)
Chronic, stable period on atorvastatin - continue. The 10-year ASCVD risk score (Arnett DK, et al., 2019) is: 41.1%   Values used to calculate the score:     Age: 72 years     Sex: Male     Is Non-Hispanic African American: No     Diabetic: Yes     Tobacco smoker: No     Systolic Blood Pressure: 156 mmHg     Is BP treated: Yes     HDL Cholesterol: 44.7 mg/dL     Total Cholesterol: 146 mg/dL

## 2022-11-06 NOTE — Assessment & Plan Note (Signed)
Advanced planning - does not have at home. Would want wife to be HCPOA. Packet provided today.  

## 2022-11-06 NOTE — Patient Instructions (Addendum)
Find date of RSV vaccine and let us know to update your chart.  Advanced directive packet provided today x2.  Consider cholesterol medicine to treat bile salt diarrhea after gallbladder removal.  Start monitoring blood pressure a few times a week at home and drop off readings in 2 weeks to review.  Return in 3 months for diabetes follow up visit.

## 2022-11-06 NOTE — Assessment & Plan Note (Signed)
Mild, continue to monitor. 

## 2022-11-06 NOTE — Assessment & Plan Note (Signed)
Encouraged fully quitting dipping

## 2022-11-06 NOTE — Assessment & Plan Note (Signed)
Preventative protocols reviewed and updated unless pt declined. Discussed healthy diet and lifestyle.  

## 2022-11-06 NOTE — Progress Notes (Signed)
Ph: [phone] Fax: [phone]   Patient ID: Mark Davidson, male    DOB: 10-31-1950, 72 y.o.   MRN: [id number]  This visit was conducted in person.  BP (!) 156/66   Pulse 70   Temp 97.8 F (36.6 C) (Temporal)   Ht 5' 8.5" (1.74 m)   Wt 168 lb 8 oz (76.4 kg)   SpO2 98%   BMI 25.25 kg/m   BP Readings from Last 3 Encounters:  11/06/22 (!) 156/66  09/18/22 115/62  09/09/22 136/74  170/80 on retesting Home BP running 130-140/75  CC: CPE Subjective:   HPI: Mark Davidson is a 72 y.o. male presenting on 11/06/2022 for Annual Exam (MCR prt 2 [AWV- 08/06/22].)   Saw health advisor 07/2022 for medicare wellness visit. Note reviewed.    No results found.  Flowsheet Row Clinical Support from 08/06/2022 in [facility]  PHQ-2 Total Score 0          08/06/2022    2:28 PM 07/30/2022   12:40 PM 07/30/2021    3:24 PM 06/06/2021    2:41 PM 03/07/2021    1:54 PM  Fall Risk   Falls in the past year? 0 0 0 0 0  Number falls in past yr: 0      Injury with Fall? 0 0     Risk for fall due to : No Fall Risks    No Fall Risks  Follow up Falls prevention discussed;Falls evaluation completed    Falls evaluation completed   GI symptoms of bowel frequency, lower abd pain - reassuring evaluation (stool studies for infection, pancreatic fecal elastase, colonoscopy with normal biopsies, alpha gal panel, food allergy panel negative, EGD with normal biopsies). Improved on daily probiotic - continues this.  Notes worse diarrhea with greasy foods - with green color.  S/p cholecystectomy 2019.   Preventative: COLONOSCOPY WITH PROPOFOL 08/28/2021 - TAs, poor prep, hemorrhoids, rpt 6 mo (Vanga, Loel Dubonnet, MD) - saw gi in follow up - planning rpt towards end of this year.  ESOPHAGOGASTRODUODENOSCOPY 08/28/2021 - gastric polyps, small HH, biopsy with chronic gastritis (Vanga, Loel Dubonnet, MD) - managed with omeprazole 20mg  daily  Prostate cancer screening -  discussed. Yearly PSA. nocturia x3-4/night.  No fmhx AAA.  Lung cancer screening - not eligible  Flu shot yearly  COVID vaccine - Moderna 06/2019, booster 02/2020 Td 2012, Tdap 04/2021 Pneumovax 08/2017, prevnar 09/2018 Shingrix - 04/2021, 06/2021 RSV - discussed  Advanced planning - does not have at home. Would want wife to be HCPOA. Packet provided today.  Seat belt use discussed Sunscreen use discussed. No changing moles on skin.  Non smoker, he does dip  Alcohol - none - quit 2021 Eye exam - yearly  Dentist - yearly  Bowels - no constipation. Very intermittent diarrhea.  Bladder - no incontinence   Caffeine: 4-5 sodas/day, 1 cup coffee/day Lives with wife, 1 dog.  No children Occupation: Naval architect at [employer] Edu: GED Activity: stays active, mowing grass, walking and lifting weights. Diet: poor fruits and vegetables, good water intake.  Lots of red meat     Relevant past medical, surgical, family and social history reviewed and updated as indicated. Interim medical history since our last visit reviewed. Allergies and medications reviewed and updated. Outpatient Medications Prior to Visit  Medication Sig Dispense Refill   Chlorpheniramine Maleate (ALLERGY RELIEF PO) Take by mouth as needed. OTC     diphenhydramine-acetaminophen (TYLENOL PM) 25-500 MG  TABS Take 1 tablet by mouth at bedtime.     ketoconazole (NIZORAL) 2 % shampoo apply three times per week, massage into scalp and leave in for 10 minutes before rinsing out 120 mL 11   losartan (COZAAR) 50 MG tablet Take 1 tablet (50 mg total) by mouth daily. 90 tablet 4   atorvastatin (LIPITOR) 40 MG tablet TAKE ONE TABLET BY MOUTH ONCE DAILY 90 tablet 0   metFORMIN (GLUCOPHAGE) 500 MG tablet TAKE ONE TABLET BY MOUTH EVERY MORNING WITH BREAKFAST 90 tablet 0   omeprazole (PRILOSEC) 20 MG capsule TAKE ONE CAPSULE BY MOUTH ONCE DAILY 90 capsule 0   Probiotic Product (PROBIOTIC 10 ULTRA STRENGTH PO) Take by mouth.     No  facility-administered medications prior to visit.     Per HPI unless specifically indicated in ROS section below Review of Systems  Constitutional:  Negative for activity change, appetite change, chills, fatigue, fever and unexpected weight change.  HENT:  Negative for hearing loss.   Eyes:  Negative for visual disturbance.  Respiratory:  Negative for cough, chest tightness, shortness of breath and wheezing.   Cardiovascular:  Negative for chest pain, palpitations and leg swelling.  Gastrointestinal:  Positive for diarrhea (see HPI). Negative for abdominal distention, abdominal pain, blood in stool, constipation, nausea and vomiting.  Genitourinary:  Negative for difficulty urinating and hematuria.  Musculoskeletal:  Negative for arthralgias, myalgias and neck pain.  Skin:  Negative for rash.  Neurological:  Negative for dizziness, seizures, syncope and headaches.  Hematological:  Negative for adenopathy. Does not bruise/bleed easily.  Psychiatric/Behavioral:  Negative for dysphoric mood. The patient is not nervous/anxious.     Objective:  BP (!) 156/66   Pulse 70   Temp 97.8 F (36.6 C) (Temporal)   Ht 5' 8.5" (1.74 m)   Wt 168 lb 8 oz (76.4 kg)   SpO2 98%   BMI 25.25 kg/m   Wt Readings from Last 3 Encounters:  11/06/22 168 lb 8 oz (76.4 kg)  09/09/22 174 lb 8 oz (79.2 kg)  08/06/22 173 lb (78.5 kg)      Physical Exam Vitals and nursing note reviewed.  Constitutional:      General: He is not in acute distress.    Appearance: Normal appearance. He is well-developed. He is not ill-appearing.  HENT:     Head: Normocephalic and atraumatic.     Comments: Marked TMJ clunk L>R without significant pain    Right Ear: Hearing, tympanic membrane, ear canal and external ear normal.     Left Ear: Hearing, tympanic membrane, ear canal and external ear normal.     Mouth/Throat:     Mouth: Mucous membranes are moist.     Pharynx: Oropharynx is clear. No oropharyngeal exudate or  posterior oropharyngeal erythema.  Eyes:     General: No scleral icterus.    Extraocular Movements: Extraocular movements intact.     Conjunctiva/sclera: Conjunctivae normal.     Pupils: Pupils are equal, round, and reactive to light.  Neck:     Thyroid: No thyroid mass or thyromegaly.     Vascular: Carotid bruit (mild bilateral) present.  Cardiovascular:     Rate and Rhythm: Normal rate and regular rhythm.     Pulses: Normal pulses.          Radial pulses are 2+ on the right side and 2+ on the left side.     Heart sounds: Normal heart sounds. No murmur heard. Pulmonary:     Effort:  Pulmonary effort is normal. No respiratory distress.     Breath sounds: Normal breath sounds. No wheezing, rhonchi or rales.  Abdominal:     General: Bowel sounds are normal. There is no distension.     Palpations: Abdomen is soft. There is no mass.     Tenderness: There is no abdominal tenderness. There is no guarding or rebound.     Hernia: No hernia is present.  Musculoskeletal:        General: Normal range of motion.     Cervical back: Normal range of motion and neck supple.     Right lower leg: No edema.     Left lower leg: No edema.  Lymphadenopathy:     Cervical: No cervical adenopathy.  Skin:    General: Skin is warm and dry.     Findings: No rash.  Neurological:     General: No focal deficit present.     Mental Status: He is alert and oriented to person, place, and time.  Psychiatric:        Mood and Affect: Mood normal.        Behavior: Behavior normal.        Thought Content: Thought content normal.        Judgment: Judgment normal.       Results for orders placed or performed in visit on 08/19/22  PSA  Result Value Ref Range   PSA 0.94 0.10 - 4.00 ng/mL  Comprehensive metabolic panel  Result Value Ref Range   Sodium 138 135 - 145 mEq/L   Potassium 4.3 3.5 - 5.1 mEq/L   Chloride 103 96 - 112 mEq/L   CO2 26 19 - 32 mEq/L   Glucose, Bld 130 (H) 70 - 99 mg/dL   BUN 24 (H) 6 -  23 mg/dL   Creatinine, Ser 9.52 0.40 - 1.50 mg/dL   Total Bilirubin 0.7 0.2 - 1.2 mg/dL   Alkaline Phosphatase 84 39 - 117 U/L   AST 18 0 - 37 U/L   ALT 31 0 - 53 U/L   Total Protein 6.7 6.0 - 8.3 g/dL   Albumin 4.3 3.5 - 5.2 g/dL   GFR 84.13 >24.40 mL/min   Calcium 9.7 8.4 - 10.5 mg/dL  Lipid panel  Result Value Ref Range   Cholesterol 146 0 - 200 mg/dL   Triglycerides 102.7 0.0 - 149.0 mg/dL   HDL 25.36 >64.40 mg/dL   VLDL 34.7 0.0 - 42.5 mg/dL   LDL Cholesterol 75 0 - 99 mg/dL   Total CHOL/HDL Ratio 3    NonHDL 100.94   Microalbumin / creatinine urine ratio  Result Value Ref Range   Microalb, Ur <0.7 0.0 - 1.9 mg/dL   Creatinine,U 95.6 mg/dL   Microalb Creat Ratio 0.8 0.0 - 30.0 mg/g  Hemoglobin A1c  Result Value Ref Range   Hgb A1c MFr Bld 6.8 (H) 4.6 - 6.5 %    Assessment & Plan:   Problem List Items Addressed This Visit     Advanced care planning/counseling discussion (Chronic)    Advanced planning - does not have at home. Would want wife to be HCPOA. Packet provided today.       Hypertension    Chronic, BP above goal in office today however home readings and at other offices has been stable.  Continue losartan 50mg  daily.  I asked him to start monitoring BP at home, drop off readings in 1-2 wks to titrate antihypertensive accordingly RTC 3 mo HTN/DM f/u visit  Relevant Medications   atorvastatin (LIPITOR) 40 MG tablet   Hyperlipidemia associated with type 2 diabetes mellitus (HCC)    Chronic, stable period on atorvastatin - continue. The 10-year ASCVD risk score (Arnett DK, et al., 2019) is: 41.1%   Values used to calculate the score:     Age: 39 years     Sex: Male     Is Non-Hispanic African American: No     Diabetic: Yes     Tobacco smoker: No     Systolic Blood Pressure: 156 mmHg     Is BP treated: Yes     HDL Cholesterol: 44.7 mg/dL     Total Cholesterol: 146 mg/dL       Relevant Medications   atorvastatin (LIPITOR) 40 MG tablet    metFORMIN (GLUCOPHAGE) 500 MG tablet   Bilateral carotid artery stenosis    Mild, continue to monitor.       Relevant Medications   atorvastatin (LIPITOR) 40 MG tablet   Encounter for general adult medical examination with abnormal findings - Primary    Preventative protocols reviewed and updated unless pt declined. Discussed healthy diet and lifestyle.       Smokeless tobacco use    Encouraged fully quitting dipping       Type 2 diabetes mellitus with other specified complication (HCC)    Chronic, stable on metformin - continue.  RTC 3 mo HTN/DM f/u visit       Relevant Medications   atorvastatin (LIPITOR) 40 MG tablet   metFORMIN (GLUCOPHAGE) 500 MG tablet   GERD (gastroesophageal reflux disease)    Stable period on omeprazole 20mg  daily - continue.       Relevant Medications   omeprazole (PRILOSEC) 20 MG capsule   Probiotic Product (PROBIOTIC 10 ULTRA STRENGTH) CAPS   Chronic diarrhea of unknown origin    Anticipate component of bile salt diarrhea as he notes diarrhea worse with greasy foods, s/p cholecystectomy 2019.  Discussed possible bile acid sequestrant trial - he will consider.         Meds ordered this encounter  Medications   atorvastatin (LIPITOR) 40 MG tablet    Sig: Take 1 tablet (40 mg total) by mouth daily.    Dispense:  90 tablet    Refill:  4   metFORMIN (GLUCOPHAGE) 500 MG tablet    Sig: TAKE ONE TABLET BY MOUTH EVERY MORNING WITH BREAKFAST    Dispense:  90 tablet    Refill:  4   omeprazole (PRILOSEC) 20 MG capsule    Sig: Take 1 capsule (20 mg total) by mouth daily.    Dispense:  90 capsule    Refill:  4   Probiotic Product (PROBIOTIC 10 ULTRA STRENGTH) CAPS    Sig: Take 1 capsule by mouth daily. [employer] brand   fluticasone (FLONASE) 50 MCG/ACT nasal spray    Sig: Place 2 sprays into both nostrils daily.    Dispense:  16 g    Refill:  11    No orders of the defined types were placed in this encounter.   Patient Instructions   Find date of RSV vaccine and let us know to update your chart.  Advanced directive packet provided today x2.  Consider cholesterol medicine to treat bile salt diarrhea after gallbladder removal.  Start monitoring blood pressure a few times a week at home and drop off readings in 2 weeks to review.  Return in 3 months for diabetes follow up visit.   Follow up plan:  Return in about 3 months (around 02/06/2023) for follow up visit.  Eustaquio Boyden, MD

## 2022-11-06 NOTE — Assessment & Plan Note (Signed)
Stable period on omeprazole 20mg daily - continue.  ?

## 2022-11-06 NOTE — Assessment & Plan Note (Signed)
Chronic, BP above goal in office today however home readings and at other offices has been stable.  Continue losartan 50mg  daily.  I asked him to start monitoring BP at home, drop off readings in 1-2 wks to titrate antihypertensive accordingly RTC 3 mo HTN/DM f/u visit

## 2022-11-07 NOTE — Telephone Encounter (Signed)
Record has been updated with vaccine.

## 2022-11-15 ENCOUNTER — Encounter: Payer: Self-pay | Admitting: Family Medicine

## 2022-11-28 ENCOUNTER — Encounter: Payer: Self-pay | Admitting: Family Medicine

## 2022-12-06 MED ORDER — COLESEVELAM HCL 625 MG PO TABS: 625.0000 mg | ORAL_TABLET | Freq: Two times a day (BID) | ORAL | 3 refills | Status: DC

## 2022-12-09 ENCOUNTER — Other Ambulatory Visit: Payer: Self-pay | Admitting: Dermatology

## 2022-12-09 DIAGNOSIS — L219 Seborrheic dermatitis, unspecified: Secondary | ICD-10-CM

## 2022-12-17 MED ORDER — CHOLESTYRAMINE 4 G PO PACK: 4.0000 g | PACK | Freq: Two times a day (BID) | ORAL | 11 refills | Status: DC

## 2022-12-17 NOTE — Addendum Note (Signed)
Addended by: Eustaquio Boyden on: 12/17/2022 08:09 AM   Modules accepted: Orders

## 2022-12-18 ENCOUNTER — Telehealth: Payer: Self-pay | Admitting: Family Medicine

## 2022-12-18 NOTE — Telephone Encounter (Signed)
Returned call to patient to ask about what mediation. The medication in question is the cholestyramine (Questran) 4 g packet to take 1 packet by mouth BID. Patient wanted to know if it was okay to take along with his other medications and if he take with a meal or on an empty stomach. Dr. Sharen Hones who was standing nearby informed me to inform patient to take prior to meals and 1 hour in between other medications. Patient verbalized understanding and all (if any) questions were answered.

## 2022-12-18 NOTE — Telephone Encounter (Signed)
Pt called in requesting a call back regarding medication # [phone]

## 2022-12-23 ENCOUNTER — Telehealth: Payer: Self-pay | Admitting: Family Medicine

## 2022-12-23 NOTE — Telephone Encounter (Signed)
Patient had bug bite on right hand 2 days ago. Noticed swelling last night and today has 3 little "dots" where he was bit. One of the dots looks like it has a white head to it like a pimple and the other two are just red dots. Patient states it is warm to the touch and very itchy. Went to pharmacy and was advised to put hydrocortisone cream on it this morning. Has used one time about 30 minutes ago. After putting cream in it itching has gotten better. But the swelling has not gone down any. Denies any sob, chest pain, wheezing or selling in mouth or tongue. No fever at this time. Has not used anything other then the cream and tylenol. Talking all other medications with no new changes.

## 2022-12-23 NOTE — Telephone Encounter (Signed)
Please schedule tomorrow at 12:45pm.  Elevate hand as able throughout the day.  If worsening in interim, fever> 101, streaking redness, rec seek urgent care.

## 2022-12-24 ENCOUNTER — Ambulatory Visit: Payer: No Typology Code available for payment source | Admitting: Family Medicine

## 2022-12-24 NOTE — Telephone Encounter (Signed)
Patient has been scheduled

## 2022-12-25 ENCOUNTER — Ambulatory Visit (INDEPENDENT_AMBULATORY_CARE_PROVIDER_SITE_OTHER): Payer: No Typology Code available for payment source | Admitting: Family Medicine

## 2022-12-25 ENCOUNTER — Encounter: Payer: Self-pay | Admitting: Family Medicine

## 2022-12-25 VITALS — BP 148/82 | HR 68 | Temp 97.9°F | Wt 175.4 lb

## 2022-12-25 DIAGNOSIS — W57XXXA Bitten or stung by nonvenomous insect and other nonvenomous arthropods, initial encounter: Secondary | ICD-10-CM | POA: Diagnosis not present

## 2022-12-25 DIAGNOSIS — S60561A Insect bite (nonvenomous) of right hand, initial encounter: Secondary | ICD-10-CM

## 2022-12-25 DIAGNOSIS — E1169 Type 2 diabetes mellitus with other specified complication: Secondary | ICD-10-CM | POA: Diagnosis not present

## 2022-12-25 DIAGNOSIS — Z7984 Long term (current) use of oral hypoglycemic drugs: Secondary | ICD-10-CM | POA: Diagnosis not present

## 2022-12-25 MED ORDER — TRIAMCINOLONE ACETONIDE 0.1 % EX CREA: 1.0000 | TOPICAL_CREAM | Freq: Two times a day (BID) | CUTANEOUS | 0 refills | Status: DC

## 2022-12-25 NOTE — Patient Instructions (Addendum)
Your hand got bitten by many ants - with local reaction following.  Continue tylenol, ice pack (not directly on skin), and stronger steroid triamcinolone sent to pharmacy. Don't use more than 10-14 days at a time.  If more pain or redness, could use over the counter neosporin antibiotic ointment to irritated spots.  Send me name of who dentist recommended for jaw issue.

## 2022-12-25 NOTE — Progress Notes (Signed)
Ph: [phone] Fax: [phone]   Patient ID: Mark Davidson, male    DOB: April 04, 1951, 72 y.o.   MRN: [id number]  This visit was conducted in person.  BP (!) 148/82   Pulse 68   Temp 97.9 F (36.6 C) (Temporal)   Wt 175 lb 6.4 oz (79.6 kg)   SpO2 98%   BMI 26.28 kg/m    CC: R hand bug bite Subjective:   HPI: Mark Davidson is a 72 y.o. male presenting on 12/25/2022 for Insect Bite (On right hand index, middle fingers )   DOI: 12/21/2022 Got into ants while working in the yard. Felt burning to hand, subsequently developed bumps throughout R hand.  Saw pharmacist, started taking hydrocortisone cream and ice pack as well as tylenol with benefit. Swelling and itching is actually improving.  No fevers/chills, streaking redness, nausea, vomiting.   Diabetic on metformin. Lab Results  Component Value Date   HGBA1C 6.8 (H) 08/19/2022       Relevant past medical, surgical, family and social history reviewed and updated as indicated. Interim medical history since our last visit reviewed. Allergies and medications reviewed and updated. Outpatient Medications Prior to Visit  Medication Sig Dispense Refill   atorvastatin (LIPITOR) 40 MG tablet Take 1 tablet (40 mg total) by mouth daily. 90 tablet 4   Chlorpheniramine Maleate (ALLERGY RELIEF PO) Take by mouth as needed. OTC     cholestyramine (QUESTRAN) 4 g packet Take 1 packet (4 g total) by mouth 2 (two) times daily before a meal. 60 each 11   diphenhydramine-acetaminophen (TYLENOL PM) 25-500 MG TABS Take 1 tablet by mouth at bedtime.     fluticasone (FLONASE) 50 MCG/ACT nasal spray Place 2 sprays into both nostrils daily. 16 g 11   ketoconazole (NIZORAL) 2 % shampoo APPLY AND MASSAGE ON SCALP THREE TIMES WEEKLY, LEAVE ON FOR TEN MINUTES BEFORE RINSING 120 mL 3   losartan (COZAAR) 50 MG tablet Take 1 tablet (50 mg total) by mouth daily. 90 tablet 4   metFORMIN (GLUCOPHAGE) 500 MG tablet TAKE ONE TABLET BY MOUTH EVERY  MORNING WITH BREAKFAST 90 tablet 4   omeprazole (PRILOSEC) 20 MG capsule Take 1 capsule (20 mg total) by mouth daily. 90 capsule 4   Probiotic Product (PROBIOTIC 10 ULTRA STRENGTH) CAPS Take 1 capsule by mouth daily. Good Neighbor brand     No facility-administered medications prior to visit.     Per HPI unless specifically indicated in ROS section below Review of Systems  Objective:  BP (!) 148/82   Pulse 68   Temp 97.9 F (36.6 C) (Temporal)   Wt 175 lb 6.4 oz (79.6 kg)   SpO2 98%   BMI 26.28 kg/m   Wt Readings from Last 3 Encounters:  12/25/22 175 lb 6.4 oz (79.6 kg)  11/06/22 168 lb 8 oz (76.4 kg)  09/09/22 174 lb 8 oz (79.2 kg)      Physical Exam Vitals and nursing note reviewed.  Constitutional:      Appearance: Normal appearance. He is not ill-appearing.  Musculoskeletal:        General: Normal range of motion.     Comments: 2+ radial pulses bilaterally  Skin:    General: Skin is warm and dry.     Capillary Refill: Capillary refill takes less than 2 seconds.     Findings: Rash present. No lesion. Rash is papular.     Comments: Pruritic papular rash to right hand at thumb, index finger, and  interdigital spaces between 2nd/3rd and 3rd/4th digits, with mild surrounding edema  Neurological:     Mental Status: He is alert.  Psychiatric:        Mood and Affect: Mood normal.        Behavior: Behavior normal.        Assessment & Plan:  Also - saw dentist for L TMJ discomfort - was recommended to see oral surgeon in [place] however he has difficulty with transportation. Left jaw pain has improved with decreased clunk on exam. I asked him to let me know name of recommended provided and I would see if we could find comparable provider more locally.  Problem List Items Addressed This Visit     Type 2 diabetes mellitus with other specified complication (HCC)    Avoid oral prednisone in diabetic.       Insect bite hand, right, initial encounter - Primary    Exposed to  ants on Saturday, subsequently developed papules, with one isolated vesicle in interdigital web space between 2nd/3rd digits. Most consistent with local reaction to ant bites. No signs of bacterial skin superinfection.  Overall improving with treatment to date - tylenol, ice, cortizone-10.  Will Rx stronger potency TCI steroid cream with steroid skin precautions. Red flags to seek further care reviewed.         Meds ordered this encounter  Medications   triamcinolone cream (KENALOG) 0.1 %    Sig: Apply 1 Application topically 2 (two) times daily.    Dispense:  80 g    Refill:  0    No orders of the defined types were placed in this encounter.   Patient Instructions  Your hand got bitten by many ants - with local reaction following.  Continue tylenol, ice pack (not directly on skin), and stronger steroid triamcinolone sent to pharmacy. Don't use more than 10-14 days at a time.  If more pain or redness, could use over the counter neosporin antibiotic ointment to irritated spots.  Send me name of who dentist recommended for jaw issue.   Follow up plan: No follow-ups on file.  Eustaquio Boyden, MD

## 2022-12-25 NOTE — Assessment & Plan Note (Signed)
Avoid oral prednisone in diabetic.

## 2022-12-25 NOTE — Assessment & Plan Note (Signed)
Exposed to ants on Saturday, subsequently developed papules, with one isolated vesicle in interdigital web space between 2nd/3rd digits. Most consistent with local reaction to ant bites. No signs of bacterial skin superinfection.  Overall improving with treatment to date - tylenol, ice, cortizone-10.  Will Rx stronger potency TCI steroid cream with steroid skin precautions. Red flags to seek further care reviewed.

## 2022-12-26 NOTE — Telephone Encounter (Signed)
Discussed at OV this week. I asked him to let me know name of provider he was referred to at [facility]

## 2022-12-27 ENCOUNTER — Telehealth: Payer: Self-pay | Admitting: Family Medicine

## 2022-12-27 NOTE — Telephone Encounter (Signed)
Patient called in and stated that he is experiencing some constipation while on cholestyramine (QUESTRAN) 4 g packet. He was wanting to know if there is something else that he can take. Please advise. Thank you!

## 2022-12-31 NOTE — Telephone Encounter (Addendum)
I don't recommend any other medicine at this time - he could try dropping dose to 1 packet daily or every other day to see if constipation improves.  Welchol other option was too expensive.

## 2022-12-31 NOTE — Telephone Encounter (Signed)
Lvm asking pt to call back.  Need to relay Dr. G's message.  

## 2023-01-01 NOTE — Addendum Note (Signed)
Addended by: Eustaquio Boyden on: 01/01/2023 12:35 PM   Modules accepted: Orders

## 2023-01-01 NOTE — Telephone Encounter (Signed)
Noted. Removed from med list.

## 2023-01-01 NOTE — Telephone Encounter (Signed)
Spoke with pt relaying Dr Timoteo Expose message. Pt verbalizes understanding stating he was only taking 1 packet a day. Says he would rather stop med altogether vs trying every other day.

## 2023-01-02 ENCOUNTER — Encounter: Payer: Self-pay | Admitting: Family Medicine

## 2023-01-08 ENCOUNTER — Encounter: Payer: Self-pay | Admitting: Family Medicine

## 2023-01-28 ENCOUNTER — Encounter: Payer: Self-pay | Admitting: Dermatology

## 2023-02-07 ENCOUNTER — Ambulatory Visit: Payer: No Typology Code available for payment source | Admitting: Family Medicine

## 2023-02-10 ENCOUNTER — Ambulatory Visit: Payer: No Typology Code available for payment source | Admitting: Family Medicine

## 2023-02-17 ENCOUNTER — Ambulatory Visit (INDEPENDENT_AMBULATORY_CARE_PROVIDER_SITE_OTHER): Payer: No Typology Code available for payment source | Admitting: Family Medicine

## 2023-02-17 ENCOUNTER — Encounter: Payer: Self-pay | Admitting: Family Medicine

## 2023-02-17 VITALS — BP 146/72 | HR 75 | Temp 98.2°F | Ht 68.5 in | Wt 175.5 lb

## 2023-02-17 DIAGNOSIS — E1169 Type 2 diabetes mellitus with other specified complication: Secondary | ICD-10-CM

## 2023-02-17 DIAGNOSIS — M26609 Unspecified temporomandibular joint disorder, unspecified side: Secondary | ICD-10-CM | POA: Diagnosis not present

## 2023-02-17 DIAGNOSIS — K529 Noninfective gastroenteritis and colitis, unspecified: Secondary | ICD-10-CM | POA: Diagnosis not present

## 2023-02-17 DIAGNOSIS — Z7984 Long term (current) use of oral hypoglycemic drugs: Secondary | ICD-10-CM

## 2023-02-17 LAB — POCT GLYCOSYLATED HEMOGLOBIN (HGB A1C): Hemoglobin A1C: 7.1 % — AB (ref 4.0–5.6)

## 2023-02-17 MED ORDER — BLOOD GLUCOSE TEST VI STRP: 1.0000 | ORAL_STRIP | Freq: Three times a day (TID) | 0 refills | Status: AC

## 2023-02-17 MED ORDER — LANCET DEVICE MISC: 1.0000 | Freq: Three times a day (TID) | 0 refills | Status: AC

## 2023-02-17 MED ORDER — BLOOD GLUCOSE MONITORING SUPPL DEVI: 1.0000 | Freq: Three times a day (TID) | 0 refills | Status: AC

## 2023-02-17 MED ORDER — GLIMEPIRIDE 1 MG PO TABS: 1.0000 mg | ORAL_TABLET | Freq: Every day | ORAL | 3 refills | Status: AC

## 2023-02-17 MED ORDER — CYCLOBENZAPRINE HCL 10 MG PO TABS
5.0000 mg | ORAL_TABLET | Freq: Two times a day (BID) | ORAL | 0 refills | Status: AC | PRN
Start: 2023-02-17 — End: ?

## 2023-02-17 MED ORDER — LANCETS MISC. MISC: 1.0000 | Freq: Three times a day (TID) | 0 refills | Status: AC

## 2023-02-17 NOTE — Progress Notes (Signed)
Ph: [phone] Fax: [phone]   Patient ID: Carolin Sicks, male    DOB: [birth year], 72 y.o.   MRN: [id number]  This visit was conducted in person.  BP (!) 146/72   Pulse 75   Temp 98.2 F (36.8 C) (Oral)   Ht 5' 8.5" (1.74 m)   Wt 175 lb 8 oz (79.6 kg)   SpO2 98%   BMI 26.30 kg/m    CC: DM f/u visit  Subjective:   HPI: Draeden Viele is a 72 y.o. male presenting on 02/17/2023 for Medical Management of Chronic Issues (Here for 3 mo DM f/u.)   Chronic GI issues - bloating,  Normal formed stool every morning. Sometimes loose/watery stools, sometimes yellow stool.  He continues probiotics daily with benefit.  Presumed component of bile salt diarrhea.  He thinks he may also have IBS.  Cholestyramine daily and every other day caused constipation so he decided to stop this.   Tested negative for alpha gal, normal food allergy testing. Fecal pancreatic elastase normal 04/2021. GI pathogen panel including C diff testing negative 04/2021.  iFOB positive 07/2021.  Colonoscopy 08/2021 - poor colon prep, few polyps, rec rpt 6 mo with 2d prep ([facility]).   TMJ is better with heat and tylenol.   DM - does not regularly check sugars. Compliant with antihyperglycemic regimen which includes: metformin 500mg  daily - on this for years. Has never tried off metformin. Denies low sugars or hypoglycemic symptoms. Denies paresthesias, blurry vision. Last diabetic eye exam 2024 at [facility], [place] - records requested. Glucometer brand: doesn't have this. Last foot exam: 03/2022. DSME: declined. Lab Results  Component Value Date   HGBA1C 7.1 (A) 02/17/2023   Diabetic Foot Exam - Simple   Simple Foot Form Diabetic Foot exam was performed with the following findings: Yes 02/17/2023  3:02 PM  Visual Inspection No deformities, no ulcerations, no other skin breakdown bilaterally: Yes Sensation Testing Intact to touch and monofilament testing bilaterally: Yes Pulse Check Posterior Tibialis  and Dorsalis pulse intact bilaterally: Yes Comments No claudication    Lab Results  Component Value Date   MICROALBUR <0.7 08/19/2022         Relevant past medical, surgical, family and social history reviewed and updated as indicated. Interim medical history since our last visit reviewed. Allergies and medications reviewed and updated. Outpatient Medications Prior to Visit  Medication Sig Dispense Refill   atorvastatin (LIPITOR) 40 MG tablet Take 1 tablet (40 mg total) by mouth daily. 90 tablet 4   diphenhydramine-acetaminophen (TYLENOL PM) 25-500 MG TABS Take 1 tablet by mouth at bedtime.     fluticasone (FLONASE) 50 MCG/ACT nasal spray Place 2 sprays into both nostrils daily. 16 g 11   losartan (COZAAR) 50 MG tablet Take 1 tablet (50 mg total) by mouth daily. 90 tablet 4   metFORMIN (GLUCOPHAGE) 500 MG tablet TAKE ONE TABLET BY MOUTH EVERY MORNING WITH BREAKFAST 90 tablet 4   omeprazole (PRILOSEC) 20 MG capsule Take 1 capsule (20 mg total) by mouth daily. 90 capsule 4   Probiotic Product (PROBIOTIC 10 ULTRA STRENGTH) CAPS Take 1 capsule by mouth daily. [employer]ighbor brand     ketoconazole (NIZORAL) 2 % shampoo APPLY AND MASSAGE ON SCALP THREE TIMES WEEKLY, LEAVE ON FOR TEN MINUTES BEFORE RINSING (Patient not taking: Reported on 02/17/2023) 120 mL 3   Chlorpheniramine Maleate (ALLERGY RELIEF PO) Take by mouth as needed. OTC     triamcinolone cream (KENALOG) 0.1 % Apply 1 Application  topically 2 (two) times daily. 80 g 0   No facility-administered medications prior to visit.     Per HPI unless specifically indicated in ROS section below Review of Systems  Objective:  BP (!) 146/72   Pulse 75   Temp 98.2 F (36.8 C) (Oral)   Ht 5' 8.5" (1.74 m)   Wt 175 lb 8 oz (79.6 kg)   SpO2 98%   BMI 26.30 kg/m   Wt Readings from Last 3 Encounters:  02/17/23 175 lb 8 oz (79.6 kg)  12/25/22 175 lb 6.4 oz (79.6 kg)  11/06/22 168 lb 8 oz (76.4 kg)      Physical Exam Vitals and  nursing note reviewed.  Constitutional:      Appearance: Normal appearance. He is not ill-appearing.  Eyes:     Extraocular Movements: Extraocular movements intact.     Conjunctiva/sclera: Conjunctivae normal.     Pupils: Pupils are equal, round, and reactive to light.  Cardiovascular:     Rate and Rhythm: Normal rate and regular rhythm.     Pulses: Normal pulses.     Heart sounds: Normal heart sounds. No murmur heard. Pulmonary:     Effort: Pulmonary effort is normal. No respiratory distress.     Breath sounds: Normal breath sounds. No wheezing, rhonchi or rales.  Musculoskeletal:     Right lower leg: No edema.     Left lower leg: No edema.     Comments: See HPI for foot exam if done  Skin:    General: Skin is warm and dry.     Findings: No rash.  Neurological:     Mental Status: He is alert.  Psychiatric:        Mood and Affect: Mood normal.        Behavior: Behavior normal.       Results for orders placed or performed in visit on 02/17/23  POCT glycosylated hemoglobin (Hb A1C)  Result Value Ref Range   Hemoglobin A1C 7.1 (A) 4.0 - 5.6 %   HbA1c POC (<> result, manual entry)     HbA1c, POC (prediabetic range)     HbA1c, POC (controlled diabetic range)      Assessment & Plan:   Problem List Items Addressed This Visit     Type 2 diabetes mellitus with other specified complication (HCC) - Primary    Chronic, stable, overall adequate on low dose metformin however he notes ongoing GI difficulties - will trial off metformin for 2-3 weeks and reassess GI symptoms. Will start glimepiride 1mg  daily with breakfast in interim while off metformin. RTC 3 mo DM f/u visit.       Relevant Medications   glimepiride (AMARYL) 1 MG tablet   Other Relevant Orders   POCT glycosylated hemoglobin (Hb A1C) (Completed)   Chronic diarrhea of unknown origin    Overdue for rpt colonoscopy after prior poor prep - # provided to call GI office.  Anticipate component of bile salt diarrhea.  He  endorses component of IBS as symptoms are better with probiotic.  See above re: metformin plan. Consider trial off PPI followed by statin and ARB.       TMJ (temporomandibular joint syndrome)    Chronic, left sided discomfort. Was referred by his dentist to a dentist in [place] that specializes in TMJ.  He notes symptoms are better - manages them with heat and tylenol with benefit.  Rx flexeril PRN, with sedation precautions.         Meds  ordered this encounter  Medications   Blood Glucose Monitoring Suppl DEVI    Sig: 1 each by Does not apply route in the morning, at noon, and at bedtime. May substitute to any manufacturer covered by patient's insurance.    Dispense:  1 each    Refill:  0   Glucose Blood (BLOOD GLUCOSE TEST STRIPS) STRP    Sig: 1 each by In Vitro route in the morning, at noon, and at bedtime. May substitute to any manufacturer covered by patient's insurance.    Dispense:  100 strip    Refill:  0   Lancet Device MISC    Sig: 1 each by Does not apply route in the morning, at noon, and at bedtime. May substitute to any manufacturer covered by patient's insurance.    Dispense:  1 each    Refill:  0   Lancets Misc. MISC    Sig: 1 each by Does not apply route in the morning, at noon, and at bedtime. May substitute to any manufacturer covered by patient's insurance.    Dispense:  100 each    Refill:  0   cyclobenzaprine (FLEXERIL) 10 MG tablet    Sig: Take 0.5-1 tablets (5-10 mg total) by mouth 2 (two) times daily as needed for muscle spasms (TMJ pain - with sedation precautions).    Dispense:  30 tablet    Refill:  0   glimepiride (AMARYL) 1 MG tablet    Sig: Take 1 tablet (1 mg total) by mouth daily with breakfast.    Dispense:  30 tablet    Refill:  3    Orders Placed This Encounter  Procedures   POCT glycosylated hemoglobin (Hb A1C)    Patient Instructions  Price out glucometer at your pharmacy.  Hold metformin for 2-3 weeks. If GI symptoms improve off  metformin, let me know.  In place of metformin may try glimepiride 1 mg daily with breakfast.  Call [facility] at [phone] to schedule an appointment for repeat colonoscopy as you're due.  Good to see you today Return as needed or in 3-4 months for diabetes follow up visit.   Follow up plan: Return in about 3 months (around 05/20/2023) for follow up visit.  Eustaquio Boyden, MD

## 2023-02-17 NOTE — Assessment & Plan Note (Signed)
Overdue for rpt colonoscopy after prior poor prep - # provided to call GI office.  Anticipate component of bile salt diarrhea.  He endorses component of IBS as symptoms are better with probiotic.  See above re: metformin plan. Consider trial off PPI followed by statin and ARB.

## 2023-02-17 NOTE — Assessment & Plan Note (Signed)
Chronic, left sided discomfort. Was referred by his dentist to a dentist in [place] that specializes in TMJ.  He notes symptoms are better - manages them with heat and tylenol with benefit.  Rx flexeril PRN, with sedation precautions.

## 2023-02-17 NOTE — Patient Instructions (Addendum)
Price out glucometer at [employer].  Hold metformin for 2-3 weeks. If GI symptoms improve off metformin, let me know.  In place of metformin may try glimepiride 1 mg daily with breakfast.  Call [facility] at [phone] to schedule an appointment for repeat colonoscopy as you're due.  Good to see you today Return as needed or in 3-4 months for diabetes follow up visit.

## 2023-02-17 NOTE — Assessment & Plan Note (Signed)
Chronic, stable, overall adequate on low dose metformin however he notes ongoing GI difficulties - will trial off metformin for 2-3 weeks and reassess GI symptoms. Will start glimepiride 1mg  daily with breakfast in interim while off metformin. RTC 3 mo DM f/u visit.

## 2023-02-24 ENCOUNTER — Encounter: Payer: Self-pay | Admitting: Family Medicine

## 2023-05-16 ENCOUNTER — Encounter: Payer: Self-pay | Admitting: Family Medicine

## 2023-06-04 MED ORDER — METFORMIN HCL ER 500 MG PO TB24: 500.0000 mg | ORAL_TABLET | Freq: Every day | ORAL | 3 refills | Status: AC

## 2023-06-20 ENCOUNTER — Ambulatory Visit: Payer: No Typology Code available for payment source | Admitting: Family Medicine

## 2023-07-01 ENCOUNTER — Emergency Department

## 2023-07-01 ENCOUNTER — Emergency Department
Admission: EM | Admit: 2023-07-01 | Discharge: 2023-07-01 | Disposition: A | Discharge status: Home / Self Care | Attending: Emergency Medicine | Admitting: Emergency Medicine

## 2023-07-01 ENCOUNTER — Other Ambulatory Visit: Payer: Self-pay

## 2023-07-01 DIAGNOSIS — M48061 Spinal stenosis, lumbar region without neurogenic claudication: Secondary | ICD-10-CM | POA: Diagnosis not present

## 2023-07-01 DIAGNOSIS — M47816 Spondylosis without myelopathy or radiculopathy, lumbar region: Secondary | ICD-10-CM | POA: Diagnosis not present

## 2023-07-01 DIAGNOSIS — M4306 Spondylolysis, lumbar region: Secondary | ICD-10-CM | POA: Insufficient documentation

## 2023-07-01 DIAGNOSIS — M4807 Spinal stenosis, lumbosacral region: Secondary | ICD-10-CM | POA: Diagnosis not present

## 2023-07-01 DIAGNOSIS — M5136 Other intervertebral disc degeneration, lumbar region with discogenic back pain only: Secondary | ICD-10-CM | POA: Diagnosis not present

## 2023-07-01 DIAGNOSIS — M545 Low back pain, unspecified: Secondary | ICD-10-CM | POA: Diagnosis present

## 2023-07-01 HISTORY — DX: Unspecified hemorrhoids: K64.9

## 2023-07-01 MED ORDER — TRAMADOL HCL 50 MG PO TABS
100.0000 mg | ORAL_TABLET | Freq: Once | ORAL | Status: AC
  Administered 2023-07-01: 100 mg via ORAL
  Filled 2023-07-01: qty 2

## 2023-07-01 MED ORDER — TRAMADOL HCL 50 MG PO TABS: 50.0000 mg | ORAL_TABLET | Freq: Four times a day (QID) | ORAL | 0 refills | Status: AC | PRN

## 2023-07-01 NOTE — ED Triage Notes (Signed)
 Pt to ED POV for R lower back pain since 2 days and also constipation. Small BM today. Back pain worse with certain movements like turning. Also some rectal pain. Has known hemorrhoid that "burns" sometimes.   Has had bloating on and off for years, all tests have turned out normal. Pt denies abdominal pain or bloating at this time. Denies urinary symptoms.

## 2023-07-01 NOTE — ED Provider Notes (Signed)
 [facility] Provider Note    Event Date/Time   First MD Initiated Contact with Patient 07/01/23 1510     (approximate)   History   Back Pain, Constipation, and Rectal Pain   HPI  Mark Davidson is a 73 y.o. male who presents today with history of 3 days of right lower back pain that limits passing the stool.  Patient states he has history of hemorrhoids no active bleeding at this moment.  Pain increase with rotation.  Right leg elevation negative.  Tender to palpation in the paraspinal  right lower back.      Physical Exam   Triage Vital Signs: ED Triage Vitals  Encounter Vitals Group     BP 07/01/23 1421 (!) 150/66     Systolic BP Percentile --      Diastolic BP Percentile --      Pulse Rate 07/01/23 1421 70     Resp 07/01/23 1421 16     Temp 07/01/23 1425 97.9 F (36.6 C)     Temp Source 07/01/23 1421 Oral     SpO2 07/01/23 1421 99 %     Weight 07/01/23 1421 173 lb (78.5 kg)     Height 07/01/23 1421 5\' 9"  (1.753 m)     Head Circumference --      Peak Flow --      Pain Score 07/01/23 1423 4     Pain Loc --      Pain Education --      Exclude from Growth Chart --     Most recent vital signs: Vitals:   07/01/23 1421 07/01/23 1425  BP: (!) 150/66   Pulse: 70   Resp: 16   Temp:  97.9 F (36.6 C)  SpO2: 99%      Constitutional: Alert, NAD. Able to speak in complete sentences without cough or dyspnea  Eyes: Conjunctivae are normal.  Head: Atraumatic. Nose: No congestion/rhinnorhea. Mouth/Throat: Mucous membranes are moist.   Neck: Painless ROM. Supple. No JVD, nodes, thyromegaly  Cardiovascular:   Good peripheral circulation.RRR no murmurs, gallops, rubs  Respiratory: Normal respiratory effort.  No retractions. Clear to auscultation bilaterally without wheezing or crackles  Gastrointestinal: Soft and nontender.  Musculoskeletal:  no deformity Right lower back: Skin intact, no ecchymosis no hematomas tender to palpation in  paraspinal area.  Neurologic:  MAE spontaneously. No gross focal neurologic deficits are appreciated.  Skin:  Skin is warm, dry and intact. No rash noted. Psychiatric: Mood and affect are normal. Speech and behavior are normal.    ED Results / Procedures / Treatments   Labs (all labs ordered are listed, but only abnormal results are displayed) Labs Reviewed - No data to display   EKG    RADIOLOGY I independently reviewed and interpreted imaging and agree with radiologists findings.      PROCEDURES:  Critical Care performed:   Procedures   MEDICATIONS ORDERED IN ED: Medications  traMADol (ULTRAM) tablet 100 mg (100 mg Oral Given 07/01/23 1552)   Clinical Course as of 07/01/23 1753  Tue Jul 01, 2023  1712 Reassesed  patient after having tramadol, pain improved [AE]  1745 CT Lumbar Spine Wo Contrast 1. No evidence of an acute fracture to the lumbar spine. 2. Lumbar spondylosis as outlined in the body of the report. 3. No more than mild central canal narrowing is appreciated. 4. Multifactorial left subarticular narrowing at L4-L5. 5. Multilevel foraminal stenosis, greatest on the right at L3-L4 (moderate), bilaterally at  L4-L5 (moderate-to-severe right, moderate left) and bilaterally at L5-S1 (severe right, moderate left). 6. Disc degeneration is greatest at L4-L5 (advanced at this level). 7. Dextrocurvature of the lumbar and visualized lower thoracic spine. 8. Early osseous fusion across the bilateral sacroiliac joints posteriorly. 9. Aortic atherosclerosis.   [AE]    Clinical Course User Index [AE] Gladys Damme, PA-C    IMPRESSION / MDM / ASSESSMENT AND PLAN / ED COURSE  I reviewed the triage vital signs and the nursing notes.  Differential diagnosis includes, but is not limited to, skull hernia, sprain muscle, fracture, sciatica.   Patient's presentation is most consistent with acute complicated illness / injury requiring diagnostic  workup.   Patient's diagnosis is consistent with lumbar spondylosis. I independently reviewed and interpreted imaging and agree with radiologists findings.. I did review the patient's allergies and medications.The patient is in stable and satisfactory condition for discharge home  Patient will be discharged home with prescriptions for tramadol and naproxen. Patient is to follow up with PCP and Orthopedics as needed or otherwise directed. Patient is given ED precautions to return to the ED for any worsening or new symptoms. Discussed plan of care with patient, answered all of patient's questions, Patient agreeable to plan of care. Advised patient to take medications according to the instructions on the label. Discussed possible side effects of new medications. Patient verbalized understanding.    FINAL CLINICAL IMPRESSION(S) / ED DIAGNOSES   Final diagnoses:  Lumbar spondylolysis     Rx / DC Orders   ED Discharge Orders          Ordered    traMADol (ULTRAM) 50 MG tablet  Every 6 hours PRN        07/01/23 1742             Note:  This document was prepared using Dragon voice recognition software and may include unintentional dictation errors.   Gladys Damme, PA-C 07/01/23 1753    Corena Herter, MD 07/02/23 0010

## 2023-07-01 NOTE — ED Notes (Signed)
 Patient denies pain and is resting comfortably.

## 2023-07-01 NOTE — Discharge Instructions (Addendum)
 Have been diagnosed with lumbar spondylosis, please take tramadol 1 tablet by mouth every 6 hours as needed for pain.  You can take acetaminophen 500 mg alternated with tramadol for pain.  Please call Dr. Audelia Acton and make an appointment for a follow-up.  Please come back to ED or go to your PCP if you have new symptoms or symptoms worsen.

## 2023-07-01 NOTE — ED Notes (Signed)
 See triage note  Presents with lower back pain  States pain started 2 days ago  Denies any trauma

## 2023-07-09 ENCOUNTER — Ambulatory Visit: Payer: No Typology Code available for payment source | Admitting: Gastroenterology

## 2023-07-14 DIAGNOSIS — E1169 Type 2 diabetes mellitus with other specified complication: Secondary | ICD-10-CM | POA: Diagnosis not present

## 2023-07-14 DIAGNOSIS — M47816 Spondylosis without myelopathy or radiculopathy, lumbar region: Secondary | ICD-10-CM | POA: Diagnosis not present

## 2023-07-14 DIAGNOSIS — E785 Hyperlipidemia, unspecified: Secondary | ICD-10-CM | POA: Diagnosis not present

## 2023-07-14 DIAGNOSIS — M51362 Other intervertebral disc degeneration, lumbar region with discogenic back pain and lower extremity pain: Secondary | ICD-10-CM | POA: Diagnosis not present

## 2023-07-21 ENCOUNTER — Ambulatory Visit: Payer: No Typology Code available for payment source | Admitting: Family Medicine

## 2023-07-21 DIAGNOSIS — E1169 Type 2 diabetes mellitus with other specified complication: Secondary | ICD-10-CM

## 2023-07-28 DIAGNOSIS — M5416 Radiculopathy, lumbar region: Secondary | ICD-10-CM | POA: Diagnosis not present

## 2023-07-28 DIAGNOSIS — M5412 Radiculopathy, cervical region: Secondary | ICD-10-CM | POA: Diagnosis not present

## 2023-08-05 ENCOUNTER — Ambulatory Visit: Admitting: Family Medicine

## 2023-08-05 ENCOUNTER — Ambulatory Visit (INDEPENDENT_AMBULATORY_CARE_PROVIDER_SITE_OTHER): Admitting: Family Medicine

## 2023-08-05 ENCOUNTER — Other Ambulatory Visit: Payer: Self-pay | Admitting: Family Medicine

## 2023-08-05 ENCOUNTER — Ambulatory Visit: Payer: Self-pay

## 2023-08-05 VITALS — BP 154/64 | HR 67 | Temp 97.8°F | Ht 69.0 in | Wt 171.4 lb

## 2023-08-05 DIAGNOSIS — R109 Unspecified abdominal pain: Secondary | ICD-10-CM

## 2023-08-05 DIAGNOSIS — E1169 Type 2 diabetes mellitus with other specified complication: Secondary | ICD-10-CM

## 2023-08-05 MED ORDER — SUCRALFATE 1 GM/10ML PO SUSP: 1.0000 g | Freq: Three times a day (TID) | ORAL | 0 refills | Status: DC

## 2023-08-05 NOTE — Telephone Encounter (Signed)
 Copied from CRM [id number]. Topic: Clinical - Red Word Triage >> Aug 05, 2023 10:57 AM Ivette P wrote: [facility] that prompted transfer to Nurse Triage: stomach issues - raw nawing aching pain. upper part of abdomen. above belly button.  Chief Complaint: abd pain Symptoms: pain Frequency: comes and goes but was constant last night  Pertinent Negatives: Patient denies fever, diarrhea Disposition: [] ED /[] Urgent Care (no appt availability in office) / [x] Appointment(In office/virtual)/ []  [facility]/ [] Home Care/ [] Refused Recommended Disposition /[] [facility]/ []  Follow-up with PCP Additional Notes: per protocol apt made for today; care advice given, denies questions; instructed to go to ER if becomes worse.   Reason for Disposition  [1] MILD-MODERATE pain AND [2] constant AND [3] present > 2 hours  Answer Assessment - Initial Assessment Questions 1. LOCATION: "Where does it hurt?"      Abd pain above belly button 2. RADIATION: "Does the pain shoot anywhere else?" (e.g., chest, back)     no 3. ONSET: "When did the pain begin?" (Minutes, hours or days ago)      Started yesterday; feels like a knot 4. SUDDEN: "Gradual or sudden onset?"     gradual 5. PATTERN "Does the pain come and go, or is it constant?"    - If it comes and goes: "How long does it last?" "Do you have pain now?"     (Note: Comes and goes means the pain is intermittent. It goes away completely between bouts.)    - If constant: "Is it getting better, staying the same, or getting worse?"      (Note: Constant means the pain never goes away completely; most serious pain is constant and gets worse.)      constant 6. SEVERITY: "How bad is the pain?"  (e.g., Scale 1-10; mild, moderate, or severe)    - MILD (1-3): Doesn't interfere with normal activities, abdomen soft and not tender to touch.     - MODERATE (4-7): Interferes with normal activities or awakens from sleep, abdomen tender to touch.     - SEVERE  (8-10): Excruciating pain, doubled over, unable to do any normal activities.       6-7/10 7. RECURRENT SYMPTOM: "Have you ever had this type of stomach pain before?" If Yes, ask: "When was the last time?" and "What happened that time?"      Had stomach issues for a long time 8. CAUSE: "What do you think is causing the stomach pain?"     unknown 9. RELIEVING/AGGRAVATING FACTORS: "What makes it better or worse?" (e.g., antacids, bending or twisting motion, bowel movement)     Went away for several months 10. OTHER SYMPTOMS: "Do you have any other symptoms?" (e.g., back pain, diarrhea, fever, urination pain, vomiting)       Denies.  Protocols used: Abdominal Pain - Male-A-AH

## 2023-08-05 NOTE — Telephone Encounter (Unsigned)
 Copied from CRM [id number]. Topic: Clinical - Prescription Issue >> Aug 05, 2023  3:28 PM Shereese L wrote: Reason for CRM: Not covered sucralfate (CARAFATE) 1 GM/10ML suspension Insurance will cover Tablet abd have the patient  to dissolve in 30ml of water if he  wants suspension

## 2023-08-05 NOTE — Progress Notes (Unsigned)
 Last few days he felt like he had some chest congestion, then felt a "knot" in his stomach, more pain last night.  Finally got to sleep about 5AM today.    He had h/o similar GI months ago.  Had done well in the interval until recently.    H/o chronic diarrhea.  Prev colonoscopy per GI.  No recent diarrhea.    No vomiting.  No fevers.  No bloody or black stools.  Currently with pain/discomfort in midline, superior to the umbilicus.    Already on omeprazole, taking it prn.  Taking metformin at baseline.  Hasn't tried maalox or similar recently.  Not taking nsaids.  Recently finished prednisone course.    Meds, vitals, and allergies reviewed.   ROS: Per HPI unless specifically indicated in ROS section   Nad Ncat Neck supple, no LA Rrr Ctab Abd soft, epigastrum ttp w/o rebound.   No jaundice. Skin well-perfused. Normal bowel sounds.

## 2023-08-05 NOTE — Patient Instructions (Signed)
 Try taking sucralfate 4 times as day, prior to meals and bedtime.  Update [place] as needed.  Take care.  Glad to see you. Go to the lab on the way out.   If you have mychart we'll likely use that to update you.

## 2023-08-06 ENCOUNTER — Encounter: Payer: Self-pay | Admitting: Family Medicine

## 2023-08-06 ENCOUNTER — Telehealth: Payer: Self-pay

## 2023-08-06 DIAGNOSIS — R109 Unspecified abdominal pain: Secondary | ICD-10-CM | POA: Insufficient documentation

## 2023-08-06 LAB — COMPREHENSIVE METABOLIC PANEL WITH GFR
ALT: 30 U/L (ref 0–53)
AST: 11 U/L (ref 0–37)
Albumin: 4.6 g/dL (ref 3.5–5.2)
Alkaline Phosphatase: 98 U/L (ref 39–117)
BUN: 20 mg/dL (ref 6–23)
CO2: 27 meq/L (ref 19–32)
Calcium: 9.9 mg/dL (ref 8.4–10.5)
Chloride: 101 meq/L (ref 96–112)
Creatinine, Ser: 1.19 mg/dL (ref 0.40–1.50)
GFR: 61.86 mL/min (ref >60.00)
Glucose, Bld: 108 mg/dL — ABNORMAL HIGH (ref 70–99)
Potassium: 4 meq/L (ref 3.5–5.1)
Sodium: 139 meq/L (ref 135–145)
Total Bilirubin: 0.7 mg/dL (ref 0.2–1.2)
Total Protein: 6.9 g/dL (ref 6.0–8.3)

## 2023-08-06 LAB — CBC WITH DIFFERENTIAL/PLATELET
Basophils Absolute: 0 10*3/uL (ref 0.0–0.1)
Basophils Relative: 0.3 % (ref 0.0–3.0)
Eosinophils Absolute: 0.3 10*3/uL (ref 0.0–0.7)
Eosinophils Relative: 3 % (ref 0.0–5.0)
HCT: 40.3 % (ref 39.0–52.0)
Hemoglobin: 13.5 g/dL (ref 13.0–17.0)
Lymphocytes Relative: 19.3 % (ref 12.0–46.0)
Lymphs Abs: 2.1 10*3/uL (ref 0.7–4.0)
MCHC: 33.5 g/dL (ref 30.0–36.0)
MCV: 90.3 fl (ref 78.0–100.0)
Monocytes Absolute: 1.4 10*3/uL — ABNORMAL HIGH (ref 0.1–1.0)
Monocytes Relative: 13 % — ABNORMAL HIGH (ref 3.0–12.0)
Neutro Abs: 7.1 10*3/uL (ref 1.4–7.7)
Neutrophils Relative %: 64.4 % (ref 43.0–77.0)
Platelets: 198 10*3/uL (ref 150.0–400.0)
RBC: 4.46 Mil/uL (ref 4.22–5.81)
RDW: 13 % (ref 11.5–15.5)
WBC: 11.1 10*3/uL — ABNORMAL HIGH (ref 4.0–10.5)

## 2023-08-06 LAB — LIPASE: Lipase: 42 U/L (ref 11.0–59.0)

## 2023-08-06 LAB — HEMOGLOBIN A1C: Hgb A1c MFr Bld: 7.7 % — ABNORMAL HIGH (ref 4.6–6.5)

## 2023-08-06 MED ORDER — SUCRALFATE 1 G PO TABS: 1.0000 g | ORAL_TABLET | Freq: Three times a day (TID) | ORAL | 0 refills | Status: AC

## 2023-08-06 NOTE — Telephone Encounter (Addendum)
 Plz notify I've sent in pill form.

## 2023-08-06 NOTE — Telephone Encounter (Signed)
Spoke with pt relaying Dr. G's message.  Pt expresses his thanks.  ?

## 2023-08-06 NOTE — Assessment & Plan Note (Signed)
 With history of diabetes noted.  Recently completed prednisone course, unclear if that contributed.  I presume it is possible that prednisone course could have contributed to GERD symptoms. See notes on labs. Reasonable to try taking sucralfate 4 times as day, prior to meals and bedtime.  Update [place] as needed.  He agrees with plan.  Okay for outpatient follow-up.

## 2023-08-06 NOTE — Addendum Note (Signed)
 Addended by: Eustaquio Boyden on: 08/06/2023 01:54 PM   Modules accepted: Orders

## 2023-08-06 NOTE — Telephone Encounter (Signed)
 Copied from CRM [id number]. Topic: Clinical - Prescription Issue >> Aug 06, 2023 10:43 AM Sonny Dandy B wrote: Reason for CRM: pt called to advise his insurance does not cover this medication sucralfate (CARAFATE) 1 GM/10ML suspension [. He states they will cover the pill form. Pt is requesting  the provider to change the prescription to the pill form. Please call pt back at [phone]

## 2023-08-06 NOTE — Telephone Encounter (Signed)
 See OV note.  Thanks.

## 2023-08-12 ENCOUNTER — Telehealth: Payer: Self-pay

## 2023-08-12 NOTE — Telephone Encounter (Signed)
 Pharmacy Patient Advocate Encounter   Received notification from CoverMyMeds that prior authorization for Sucralfate 1GM/10ML suspension is required.   Insurance verification completed.   The patient is insured through [facility].  Action: Medication has been discontinued. Archived Key: [id number]

## 2023-08-25 DIAGNOSIS — E1159 Type 2 diabetes mellitus with other circulatory complications: Secondary | ICD-10-CM | POA: Diagnosis not present

## 2023-08-25 DIAGNOSIS — M5416 Radiculopathy, lumbar region: Secondary | ICD-10-CM | POA: Diagnosis not present

## 2023-08-25 DIAGNOSIS — J309 Allergic rhinitis, unspecified: Secondary | ICD-10-CM | POA: Diagnosis not present

## 2023-08-25 DIAGNOSIS — E782 Mixed hyperlipidemia: Secondary | ICD-10-CM | POA: Diagnosis not present

## 2023-08-25 DIAGNOSIS — Z1339 Encounter for screening examination for other mental health and behavioral disorders: Secondary | ICD-10-CM | POA: Diagnosis not present

## 2023-08-25 DIAGNOSIS — Z7689 Persons encountering health services in other specified circumstances: Secondary | ICD-10-CM | POA: Diagnosis not present

## 2023-08-25 DIAGNOSIS — K219 Gastro-esophageal reflux disease without esophagitis: Secondary | ICD-10-CM | POA: Diagnosis not present

## 2023-08-25 DIAGNOSIS — Z1331 Encounter for screening for depression: Secondary | ICD-10-CM | POA: Diagnosis not present

## 2023-08-25 DIAGNOSIS — Z7984 Long term (current) use of oral hypoglycemic drugs: Secondary | ICD-10-CM | POA: Diagnosis not present

## 2023-08-25 DIAGNOSIS — E1169 Type 2 diabetes mellitus with other specified complication: Secondary | ICD-10-CM | POA: Diagnosis not present

## 2023-08-25 DIAGNOSIS — I10 Essential (primary) hypertension: Secondary | ICD-10-CM | POA: Diagnosis not present

## 2023-08-27 ENCOUNTER — Telehealth: Payer: Self-pay

## 2023-08-27 DIAGNOSIS — R059 Cough, unspecified: Secondary | ICD-10-CM | POA: Diagnosis not present

## 2023-08-27 NOTE — Telephone Encounter (Signed)
 Request was sent to [facility] records for records to be sent to [facility]) on 08/27/2023

## 2023-09-08 DIAGNOSIS — M5416 Radiculopathy, lumbar region: Secondary | ICD-10-CM | POA: Diagnosis not present

## 2023-09-08 DIAGNOSIS — M5412 Radiculopathy, cervical region: Secondary | ICD-10-CM | POA: Diagnosis not present

## 2023-09-09 DIAGNOSIS — M5412 Radiculopathy, cervical region: Secondary | ICD-10-CM | POA: Diagnosis not present

## 2023-09-09 DIAGNOSIS — M542 Cervicalgia: Secondary | ICD-10-CM | POA: Diagnosis not present

## 2023-09-09 DIAGNOSIS — M545 Low back pain, unspecified: Secondary | ICD-10-CM | POA: Diagnosis not present

## 2023-09-11 DIAGNOSIS — M545 Low back pain, unspecified: Secondary | ICD-10-CM | POA: Diagnosis not present

## 2023-09-11 DIAGNOSIS — M5412 Radiculopathy, cervical region: Secondary | ICD-10-CM | POA: Diagnosis not present

## 2023-09-11 DIAGNOSIS — M542 Cervicalgia: Secondary | ICD-10-CM | POA: Diagnosis not present

## 2023-09-15 DIAGNOSIS — M545 Low back pain, unspecified: Secondary | ICD-10-CM | POA: Diagnosis not present

## 2023-09-15 DIAGNOSIS — M5412 Radiculopathy, cervical region: Secondary | ICD-10-CM | POA: Diagnosis not present

## 2023-09-15 DIAGNOSIS — M542 Cervicalgia: Secondary | ICD-10-CM | POA: Diagnosis not present

## 2023-09-23 ENCOUNTER — Ambulatory Visit: Admitting: Dermatology

## 2023-09-23 ENCOUNTER — Encounter: Payer: Self-pay | Admitting: Dermatology

## 2023-09-23 DIAGNOSIS — W908XXA Exposure to other nonionizing radiation, initial encounter: Secondary | ICD-10-CM

## 2023-09-23 DIAGNOSIS — L570 Actinic keratosis: Secondary | ICD-10-CM

## 2023-09-23 DIAGNOSIS — L219 Seborrheic dermatitis, unspecified: Secondary | ICD-10-CM | POA: Diagnosis not present

## 2023-09-23 DIAGNOSIS — L578 Other skin changes due to chronic exposure to nonionizing radiation: Secondary | ICD-10-CM | POA: Diagnosis not present

## 2023-09-23 DIAGNOSIS — L719 Rosacea, unspecified: Secondary | ICD-10-CM

## 2023-09-23 DIAGNOSIS — Z7189 Other specified counseling: Secondary | ICD-10-CM

## 2023-09-23 NOTE — Progress Notes (Signed)
 Follow-Up Visit   Subjective  Mark Davidson is a 73 y.o. male who presents for the following: AK 1 yr f/u, scalp, seb derm not using anything right now due to interactions with probiotic and Ketoconazole  The patient has spots, moles and lesions to be evaluated, some may be new or changing and the patient may have concern these could be cancer.  The following portions of the chart were reviewed this encounter and updated as appropriate: medications, allergies, medical history  Review of Systems:  No other skin or systemic complaints except as noted in HPI or Assessment and Plan.  Objective  Well appearing patient in no apparent distress; mood and affect are within normal limits.  A focused examination was performed of the following areas: Face, scalp  Relevant exam findings are noted in the Assessment and Plan.  Scalp x 2 (2) Pink scaly macules  Assessment & Plan   ROSACEA face Exam Mid face erythema with telangiectasias cheeks, nose, pap nose Chronic and persistent condition with duration or expected duration over one year. Condition is symptomatic / bothersome to patient. Not to goal. Rosacea is a chronic progressive skin condition usually affecting the face of adults, causing redness and/or acne bumps. It is treatable but not curable. It sometimes affects the eyes (ocular rosacea) as well. It may respond to topical and/or systemic medication and can flare with stress, sun exposure, alcohol, exercise, topical steroids (including hydrocortisone/cortisone 10) and some foods.  Daily application of broad spectrum spf 30+ sunscreen to face is recommended to reduce flares.  Patient denies grittiness of the eyes  Treatment Plan Counseling for BBL / IPL / Laser and Coordination of Care Discussed the treatment option of Broad Band Light (BBL) /Intense Pulsed Light (IPL)/ Laser for skin discoloration, including brown spots and redness.  Typically we recommend at least 1-3 treatment  sessions about 5-8 weeks apart for best results.  Cannot have tanned skin when BBL performed, and regular use of sunscreen/photoprotection is advised after the procedure to help maintain results. The patient's condition may also require "maintenance treatments" in the future.  The fee for BBL / laser treatments is $350 per treatment session for the whole face.  A fee can be quoted for other parts of the body.  Insurance typically does not pay for BBL/laser treatments and therefore the fee is an out-of-pocket cost. Recommend prophylactic valtrex treatment. Once scheduled for procedure, will send Rx in prior to patient's appointment.   SEBORRHEIC DERMATITIS scalp Exam: scalp clear today Chronic condition with duration or expected duration over one year. Currently well-controlled. Seborrheic Dermatitis is a chronic persistent rash characterized by pinkness and scaling most commonly of the mid face but also can occur on the scalp (dandruff), ears; mid chest, mid back and groin.  It tends to be exacerbated by stress and cooler weather.  People who have neurologic disease may experience new onset or exacerbation of existing seborrheic dermatitis.  The condition is not curable but treatable and can be controlled. Treatment Plan: May use otc Head and Shoulders if flares, samples given. Pt feels he may have gotten rash with Ketoconazole  shampoo  AK (ACTINIC KERATOSIS) (2) Scalp x 2 (2) Actinic keratoses are precancerous spots that appear secondary to cumulative UV radiation exposure/sun exposure over time. They are chronic with expected duration over 1 year. A portion of actinic keratoses will progress to squamous cell carcinoma of the skin. It is not possible to reliably predict which spots will progress to skin cancer and so  treatment is recommended to prevent development of skin cancer.  Recommend daily broad spectrum sunscreen SPF 30+ to sun-exposed areas, reapply every 2 hours as needed.  Recommend  staying in the shade or wearing long sleeves, sun glasses (UVA+UVB protection) and wide brim hats (4-inch brim around the entire circumference of the hat). Call for new or changing lesions. Destruction of lesion - Scalp x 2 (2) Complexity: simple   Destruction method: cryotherapy   Informed consent: discussed and consent obtained   Timeout:  patient name, date of birth, surgical site, and procedure verified Lesion destroyed using liquid nitrogen: Yes   Region frozen until ice ball extended beyond lesion: Yes   Outcome: patient tolerated procedure well with no complications   Post-procedure details: wound care instructions given    ACTINIC DAMAGE - chronic, secondary to cumulative UV radiation exposure/sun exposure over time - diffuse scaly erythematous macules with underlying dyspigmentation - Recommend daily broad spectrum sunscreen SPF 30+ to sun-exposed areas, reapply every 2 hours as needed.  - Recommend staying in the shade or wearing long sleeves, sun glasses (UVA+UVB protection) and wide brim hats (4-inch brim around the entire circumference of the hat). - Call for new or changing lesions.   Return in about 1 year (around 09/22/2024) for AK f/u.  I, Rollie Clipper, RMA, am acting as scribe for Celine Collard, MD .   Documentation: I have reviewed the above documentation for accuracy and completeness, and I agree with the above.  Celine Collard, MD

## 2023-09-23 NOTE — Patient Instructions (Addendum)

## 2023-09-24 ENCOUNTER — Ambulatory Visit: Payer: No Typology Code available for payment source | Admitting: Dermatology

## 2023-09-24 DIAGNOSIS — J309 Allergic rhinitis, unspecified: Secondary | ICD-10-CM | POA: Diagnosis not present

## 2023-09-24 DIAGNOSIS — Z720 Tobacco use: Secondary | ICD-10-CM | POA: Diagnosis not present

## 2023-09-24 DIAGNOSIS — Z0000 Encounter for general adult medical examination without abnormal findings: Secondary | ICD-10-CM | POA: Diagnosis not present

## 2023-09-24 DIAGNOSIS — Z1211 Encounter for screening for malignant neoplasm of colon: Secondary | ICD-10-CM | POA: Diagnosis not present

## 2023-09-24 DIAGNOSIS — Z1383 Encounter for screening for respiratory disorder NEC: Secondary | ICD-10-CM | POA: Diagnosis not present

## 2023-09-24 DIAGNOSIS — M26609 Unspecified temporomandibular joint disorder, unspecified side: Secondary | ICD-10-CM | POA: Diagnosis not present

## 2023-09-24 DIAGNOSIS — R42 Dizziness and giddiness: Secondary | ICD-10-CM | POA: Diagnosis not present

## 2023-09-24 DIAGNOSIS — K449 Diaphragmatic hernia without obstruction or gangrene: Secondary | ICD-10-CM | POA: Diagnosis not present

## 2023-09-24 DIAGNOSIS — I6523 Occlusion and stenosis of bilateral carotid arteries: Secondary | ICD-10-CM | POA: Diagnosis not present

## 2023-10-01 ENCOUNTER — Other Ambulatory Visit: Payer: Self-pay | Admitting: Family Medicine

## 2023-10-01 ENCOUNTER — Encounter: Payer: Self-pay | Admitting: Family Medicine

## 2023-10-01 DIAGNOSIS — Z0000 Encounter for general adult medical examination without abnormal findings: Secondary | ICD-10-CM

## 2023-10-02 ENCOUNTER — Other Ambulatory Visit: Payer: Self-pay | Admitting: Family Medicine

## 2023-10-02 DIAGNOSIS — Z720 Tobacco use: Secondary | ICD-10-CM

## 2023-10-02 DIAGNOSIS — Z0000 Encounter for general adult medical examination without abnormal findings: Secondary | ICD-10-CM

## 2023-10-09 ENCOUNTER — Encounter

## 2023-10-27 DIAGNOSIS — D126 Benign neoplasm of colon, unspecified: Secondary | ICD-10-CM | POA: Diagnosis not present

## 2023-10-27 DIAGNOSIS — Z860101 Personal history of adenomatous and serrated colon polyps: Secondary | ICD-10-CM | POA: Diagnosis not present

## 2023-11-05 DIAGNOSIS — E119 Type 2 diabetes mellitus without complications: Secondary | ICD-10-CM | POA: Diagnosis not present

## 2023-11-05 DIAGNOSIS — H2513 Age-related nuclear cataract, bilateral: Secondary | ICD-10-CM | POA: Diagnosis not present

## 2023-11-18 DIAGNOSIS — M5416 Radiculopathy, lumbar region: Secondary | ICD-10-CM | POA: Diagnosis not present

## 2023-11-18 DIAGNOSIS — M47816 Spondylosis without myelopathy or radiculopathy, lumbar region: Secondary | ICD-10-CM | POA: Diagnosis not present

## 2023-11-19 ENCOUNTER — Encounter: Admission: RE | Payer: Self-pay | Source: Home / Self Care

## 2023-11-19 ENCOUNTER — Ambulatory Visit: Admission: RE | Admit: 2023-11-19 | Source: Home / Self Care | Admitting: Gastroenterology

## 2023-11-19 SURGERY — COLONOSCOPY
Anesthesia: General

## 2023-12-15 DIAGNOSIS — M48062 Spinal stenosis, lumbar region with neurogenic claudication: Secondary | ICD-10-CM | POA: Diagnosis not present

## 2023-12-15 DIAGNOSIS — M5416 Radiculopathy, lumbar region: Secondary | ICD-10-CM | POA: Diagnosis not present

## 2023-12-17 ENCOUNTER — Other Ambulatory Visit: Payer: Self-pay | Admitting: Family Medicine

## 2023-12-17 DIAGNOSIS — K2100 Gastro-esophageal reflux disease with esophagitis, without bleeding: Secondary | ICD-10-CM

## 2023-12-18 NOTE — Telephone Encounter (Signed)
 E-scribed refill.  Plz schedule annual exam and fasting labs for additional refills.

## 2023-12-18 NOTE — Telephone Encounter (Signed)
 Noted. Fyi to Amy.

## 2023-12-18 NOTE — Telephone Encounter (Signed)
 Called pt and pt stated that Dr KANDICE is no longer his doctor

## 2023-12-31 ENCOUNTER — Encounter: Admitting: Family Medicine

## 2024-01-05 DIAGNOSIS — M5416 Radiculopathy, lumbar region: Secondary | ICD-10-CM | POA: Diagnosis not present

## 2024-01-05 DIAGNOSIS — M48062 Spinal stenosis, lumbar region with neurogenic claudication: Secondary | ICD-10-CM | POA: Diagnosis not present

## 2024-01-19 DIAGNOSIS — M5416 Radiculopathy, lumbar region: Secondary | ICD-10-CM | POA: Diagnosis not present

## 2024-01-19 DIAGNOSIS — M48062 Spinal stenosis, lumbar region with neurogenic claudication: Secondary | ICD-10-CM | POA: Diagnosis not present

## 2024-01-20 DIAGNOSIS — Z125 Encounter for screening for malignant neoplasm of prostate: Secondary | ICD-10-CM | POA: Diagnosis not present

## 2024-01-20 DIAGNOSIS — E118 Type 2 diabetes mellitus with unspecified complications: Secondary | ICD-10-CM | POA: Diagnosis not present

## 2024-01-20 DIAGNOSIS — I10 Essential (primary) hypertension: Secondary | ICD-10-CM | POA: Diagnosis not present

## 2024-01-20 DIAGNOSIS — Z0000 Encounter for general adult medical examination without abnormal findings: Secondary | ICD-10-CM | POA: Diagnosis not present

## 2024-01-21 DIAGNOSIS — Z125 Encounter for screening for malignant neoplasm of prostate: Secondary | ICD-10-CM | POA: Diagnosis not present

## 2024-01-21 DIAGNOSIS — E118 Type 2 diabetes mellitus with unspecified complications: Secondary | ICD-10-CM | POA: Diagnosis not present

## 2024-01-21 DIAGNOSIS — E538 Deficiency of other specified B group vitamins: Secondary | ICD-10-CM | POA: Diagnosis not present

## 2024-01-21 DIAGNOSIS — I10 Essential (primary) hypertension: Secondary | ICD-10-CM | POA: Diagnosis not present

## 2024-02-09 DIAGNOSIS — M48062 Spinal stenosis, lumbar region with neurogenic claudication: Secondary | ICD-10-CM | POA: Diagnosis not present

## 2024-02-09 DIAGNOSIS — M5416 Radiculopathy, lumbar region: Secondary | ICD-10-CM | POA: Diagnosis not present

## 2024-02-24 ENCOUNTER — Other Ambulatory Visit: Payer: Self-pay | Admitting: Internal Medicine

## 2024-02-24 DIAGNOSIS — N50812 Left testicular pain: Secondary | ICD-10-CM

## 2024-02-26 ENCOUNTER — Ambulatory Visit
Admission: RE | Admit: 2024-02-26 | Discharge: 2024-02-26 | Disposition: A | Discharge status: Home / Self Care | Source: Ambulatory Visit | Attending: Internal Medicine | Admitting: Internal Medicine

## 2024-02-26 ENCOUNTER — Other Ambulatory Visit: Payer: Self-pay | Admitting: Internal Medicine

## 2024-02-26 DIAGNOSIS — N50812 Left testicular pain: Secondary | ICD-10-CM

## 2024-04-05 ENCOUNTER — Ambulatory Visit: Admitting: Urology

## 2024-04-05 ENCOUNTER — Encounter: Payer: Self-pay | Admitting: Urology

## 2024-04-05 VITALS — BP 183/75 | HR 91 | Ht 70.0 in | Wt 173.0 lb

## 2024-04-05 DIAGNOSIS — N5082 Scrotal pain: Secondary | ICD-10-CM | POA: Diagnosis not present

## 2024-04-05 MED ORDER — MELOXICAM 7.5 MG PO TABS: 7.5000 mg | ORAL_TABLET | Freq: Every day | ORAL | 0 refills | Status: AC

## 2024-04-05 NOTE — Progress Notes (Signed)
 04/05/2024 2:55 PM   Mark Davidson Jun 23, 1950 [id number]  Referring provider: Lenon Layman ORN, MD [address] [facility] [place],  [place] [address]  Chief Complaint  Patient presents with   Hydrocele    HPI: Mark Davidson is a 73 y.o. male referred for evaluation of a left hydrocele.  States he saw Dr. Lenon last month for dull left scrotal pain.  No identifiable precipitating, aggravating or alleviating factors.  He states his pain has improved since initial presentation Scrotal sonogram was performed which showed normal-appearing testes.  There was a small right spermatocele and a small left hydrocele   PMH: Past Medical History:  Diagnosis Date   Actinic keratosis    Arthritis    Carotid stenosis    Cholecystitis, acute 07/06/2017   Colon polyp    Diabetes mellitus type 2, uncontrolled, without complications    ED (erectile dysfunction)    Hemorrhoid    Hyperlipemia    Hypertension    Left fibular fracture 01/17/2015    Surgical History: Past Surgical History:  Procedure Laterality Date   CARDIAC CATHETERIZATION  2009   normal per pt   carotid us   03/2012   bilateral 40-59% stenosis, rec rpt 1 yr   CHOLECYSTECTOMY N/A 07/06/2017   path showed acute and chronic cholecystitis - Pabon, Diego F, MD   COLONOSCOPY WITH PROPOFOL  N/A 08/28/2021   TAs, poor prep, hemorrhoids, rpt 6 mo (Vanga, Corinn Skiff, MD)   ESOPHAGOGASTRODUODENOSCOPY N/A 08/28/2021   gasric polyps, small HH, biopsy with chronic gastritis (Vanga, Corinn Skiff, MD)   TONSILLECTOMY AND ADENOIDECTOMY      Home Medications:  Allergies as of 04/05/2024       Reactions   Ace Inhibitors Cough   Advil [ibuprofen] Diarrhea   Aleve  [naproxen ] Diarrhea   Carvedilol  Other (See Comments)   Stomach pain   Metoprolol  Nausea Only   Stomach pain, loose stool   Omeprazole  Diarrhea   Gassiness, diarrhea, GI upset   Amlodipine  Other (See Comments)   Burning/itching  of skin without rash        Medication List        Accurate as of April 05, 2024  2:55 PM. If you have any questions, ask your nurse or doctor.          atorvastatin  40 MG tablet Commonly known as: LIPITOR Take 1 tablet (40 mg total) by mouth daily.   Blood Glucose Monitoring Suppl Devi 1 each by Does not apply route in the morning, at noon, and at bedtime. May substitute to any manufacturer covered by patient's insurance.   cyclobenzaprine  10 MG tablet Commonly known as: FLEXERIL  Take 0.5-1 tablets (5-10 mg total) by mouth 2 (two) times daily as needed for muscle spasms (TMJ pain - with sedation precautions).   glimepiride  1 MG tablet Commonly known as: AMARYL  Take 1 tablet (1 mg total) by mouth daily with breakfast.   hydrocortisone 2.5 % cream Apply topically 2 (two) times daily.   losartan  50 MG tablet Commonly known as: COZAAR  Take 1 tablet (50 mg total) by mouth daily.   metFORMIN  500 MG 24 hr tablet Commonly known as: GLUCOPHAGE -XR Take 1 tablet (500 mg total) by mouth daily with breakfast.   omeprazole  20 MG capsule Commonly known as: PRILOSEC  TAKE ONE CAPSULE (20 MG TOTAL) BY MOUTH DAILY.   pioglitazone 45 MG tablet Commonly known as: ACTOS Take 45 mg by mouth.   Probiotic 10 Ultra Strength Caps Take 1  capsule by mouth daily. [employer] brand   sucralfate  1 g tablet Commonly known as: Carafate  Take 1 tablet (1 g total) by mouth 4 (four) times daily -  with meals and at bedtime.   traMADol  50 MG tablet Commonly known as: Ultram  Take 1 tablet (50 mg total) by mouth every 6 (six) hours as needed.        Allergies:  Allergies  Allergen Reactions   Ace Inhibitors Cough   Advil [Ibuprofen] Diarrhea   Aleve  [Naproxen ] Diarrhea   Carvedilol  Other (See Comments)    Stomach pain   Metoprolol  Nausea Only    Stomach pain, loose stool   Omeprazole  Diarrhea    Gassiness, diarrhea, GI upset   Amlodipine  Other (See Comments)     Burning/itching of skin without rash    Family History: Family History  Problem Relation Age of Onset   Rheum arthritis Father    Hyperlipidemia Father    Heart disease Father    Stroke Father    Diabetes Father    Arthritis Father    Coronary artery disease Father    Breast cancer Mother    Cancer Mother        breast   Heart disease Brother    Coronary artery disease Brother    Hyperlipidemia Brother    Diabetes Brother     Social History:  reports that he has never smoked. His smokeless tobacco use includes snuff. He reports that he does not drink alcohol and does not use drugs.   Physical Exam: BP (!) 183/75   Pulse 91   Ht 5' 10 (1.778 m)   Wt 173 lb (78.5 kg)   BMI 24.82 kg/m   Constitutional:  Alert, No acute distress. HEENT: [place] AT Respiratory: Normal respiratory effort, no increased work of breathing. GU: Phallus without lesion.  Testes descended bilaterally out masses or tenderness.  No significant palpable hydrocele.  No evidence of hernia. Psychiatric: Normal mood and affect.  Laboratory Data:  Urinalysis Dipstick/microscopy negative   Pertinent Imaging: Scrotal ultrasound images were personally reviewed and interpreted   Assessment & Plan:    1.  Left scrotal content pain No clinically significant hydrocele or palpable abnormalities appreciated Trial meloxicam  7.5 mg daily as needed Follow-up prn   Glendia JAYSON Barba, MD  [facility] [address], [address] [place], [place] [address] [phone]

## 2024-04-06 LAB — URINALYSIS, COMPLETE
Bilirubin, UA: NEGATIVE
Glucose, UA: NEGATIVE
Ketones, UA: NEGATIVE
Leukocytes,UA: NEGATIVE
Nitrite, UA: NEGATIVE
Protein,UA: NEGATIVE
RBC, UA: NEGATIVE
Specific Gravity, UA: <1.005 — ABNORMAL LOW (ref 1.005–1.030)
Urobilinogen, Ur: 0.2 mg/dL (ref 0.2–1.0)
pH, UA: 6 (ref 5.0–7.5)

## 2024-04-06 LAB — MICROSCOPIC EXAMINATION: Bacteria, UA: NONE SEEN

## 2024-10-05 ENCOUNTER — Ambulatory Visit: Admitting: Dermatology
